# Patient Record
Sex: Female | Born: 1940 | Hispanic: No | Smoking: Former smoker
Health system: Southern US, Community
[De-identification: ages and names within clinical notes are randomized; demographics above are authoritative.]

## PROBLEM LIST (undated history)

## (undated) DIAGNOSIS — I10 Essential (primary) hypertension: Secondary | ICD-10-CM

## (undated) DIAGNOSIS — H43813 Vitreous degeneration, bilateral: Secondary | ICD-10-CM

## (undated) DIAGNOSIS — H18603 Keratoconus, unspecified, bilateral: Secondary | ICD-10-CM

## (undated) DIAGNOSIS — E785 Hyperlipidemia, unspecified: Secondary | ICD-10-CM

## (undated) DIAGNOSIS — H25813 Combined forms of age-related cataract, bilateral: Secondary | ICD-10-CM

## (undated) HISTORY — DX: Essential (primary) hypertension: I10

## (undated) HISTORY — PX: OVARIAN CYST REMOVAL: SHX89

## (undated) HISTORY — DX: Vitreous degeneration, bilateral: H43.813

## (undated) HISTORY — PX: CATARACT EXTRACTION: SUR2

## (undated) HISTORY — DX: Combined forms of age-related cataract, bilateral: H25.813

## (undated) HISTORY — DX: Keratoconus, unspecified, bilateral: H18.603

## (undated) HISTORY — DX: Hyperlipidemia, unspecified: E78.5

## (undated) HISTORY — PX: APPENDECTOMY: SHX54

---

## 2010-01-19 ENCOUNTER — Ambulatory Visit: Payer: Self-pay | Admitting: Family Medicine

## 2010-01-19 DIAGNOSIS — J45909 Unspecified asthma, uncomplicated: Secondary | ICD-10-CM | POA: Insufficient documentation

## 2010-01-19 DIAGNOSIS — E785 Hyperlipidemia, unspecified: Secondary | ICD-10-CM | POA: Insufficient documentation

## 2010-01-24 LAB — CONVERTED CEMR LAB
AST: 18 units/L (ref 0–37)
Alkaline Phosphatase: 49 units/L (ref 39–117)
BUN: 15 mg/dL (ref 6–23)
Calcium: 9.4 mg/dL (ref 8.4–10.5)
Creatinine, Ser: 0.76 mg/dL (ref 0.40–1.20)
HDL: 42 mg/dL (ref >39)
LDL Cholesterol: 132 mg/dL — ABNORMAL HIGH (ref 0–99)
Total CHOL/HDL Ratio: 5.9

## 2010-03-27 ENCOUNTER — Ambulatory Visit: Payer: Self-pay | Admitting: Family Medicine

## 2010-03-27 DIAGNOSIS — R7301 Impaired fasting glucose: Secondary | ICD-10-CM | POA: Insufficient documentation

## 2010-03-27 DIAGNOSIS — F4321 Adjustment disorder with depressed mood: Secondary | ICD-10-CM | POA: Insufficient documentation

## 2010-03-27 DIAGNOSIS — I10 Essential (primary) hypertension: Secondary | ICD-10-CM | POA: Insufficient documentation

## 2010-03-27 LAB — CONVERTED CEMR LAB: Blood Glucose, AC Bkfst: 128 mg/dL

## 2010-05-22 ENCOUNTER — Ambulatory Visit: Payer: Self-pay | Admitting: Family Medicine

## 2010-05-22 DIAGNOSIS — R0902 Hypoxemia: Secondary | ICD-10-CM | POA: Insufficient documentation

## 2010-05-23 LAB — CONVERTED CEMR LAB
Glucose, Bld: 120 mg/dL — ABNORMAL HIGH (ref 70–99)
HDL: 44 mg/dL (ref >39)
LDL Cholesterol: 112 mg/dL — ABNORMAL HIGH (ref 0–99)
Pro B Natriuretic peptide (BNP): 9.1 pg/mL (ref 0.0–100.0)

## 2010-05-24 ENCOUNTER — Telehealth (INDEPENDENT_AMBULATORY_CARE_PROVIDER_SITE_OTHER): Payer: Self-pay | Admitting: *Deleted

## 2010-09-24 ENCOUNTER — Ambulatory Visit: Payer: Self-pay | Admitting: Family Medicine

## 2010-11-26 ENCOUNTER — Telehealth: Payer: Self-pay | Admitting: Family Medicine

## 2010-12-11 NOTE — Assessment & Plan Note (Signed)
Summary: NOV- asthma   Vital Signs:  Patient profile:   70 year old female Height:      63 inches Weight:      175 pounds BMI:     31.11 O2 Sat:      97 % on Room air Temp:     98.0 degrees F oral Pulse rate:   72 / minute BP sitting:   129 / 73  (left arm) Cuff size:   large  Vitals Entered By: Payton Spark CMA (January 19, 2010 9:27 AM)  O2 Flow:  Room air CC: New to est. Rx refills Is Patient Diabetic? No Pain Assessment Patient in pain? no        Primary Care Provider:  Seymour Bars DO  CC:  New to est. Rx refills.  History of Present Illness: 31 yr WF presents to establish care. She also  needs her ProAir medication refilled. Today she has a dry cough and sinus drainage in her face. She denies HA, SOB, or congestion.   Asthma History    Initial Asthma Severity Rating:    Age range: 12+ years    Symptoms: daily    Nighttime Awakenings: 0-2/month    Interferes w/ normal activity: minor limitations    SABA use (not for EIB): daily    Exacerbations requiring oral systemic steroids: 0-1/year    Asthma Severity Assessment: Moderate Persistent   Habits & Providers  Alcohol-Tobacco-Diet     Tobacco Status: quit     Year Quit: 1982  Exercise-Depression-Behavior     Does Patient Exercise: yes     Type of exercise: walking     Exercise (avg: min/session): <30     Times/week: 3     Drug Use: no     Seat Belt Use: always  Problems Prior to Update: None  Medications Prior to Update: 1)  None  Current Medications (verified): 1)  Hydrochlorothiazide 25 Mg Tabs (Hydrochlorothiazide) .... Take 1 Tab By Mouth Once Daily 2)  Advair Diskus 250-50 Mcg/dose Aepb (Fluticasone-Salmeterol) .... Take 2 Puffs Once Daily 3)  Proair Hfa 108 (90 Base) Mcg/act Aers (Albuterol Sulfate) .... Take 2 Puffs Daily As Needed  Allergies (verified): 1)  ! * Peanuts 2)  ! * Cornbread 3)  ! * Popcorn  Past History:  Past Medical  History: Asthma Hypertension Hyperlipidemia  Past Surgical History: Ovarian cyst removal >20 years ago. Appendectomy >20 years ago.  Family History: Mother, diabetes, deceased. Father, heart attack, deceased > age 53 Sister, brain tumor, deceased. Sister, COPD, living Brother, prostate cancer, living.  Social History: Retired Lawyer. Married Former Smoker-quit 1982 Alcohol use-no Drug use-no 3 kids all local Smoking Status:  quit Does Patient Exercise:  yes Seat Belt Use:  always Drug Use:  no  Review of Systems       no fevers/sweats/weakness, unexplained wt loss/gain, no change in vision, no difficulty hearing, ringing in ears, no hay fever/allergies, no CP/discomfort, no palpitations, no breast lump/nipple discharge, no cough/wheeze, no blood in stool, no N/V/D, no nocturia, no leaking urine, no unusual vag bleeding, no vaginal/penile discharge, no muscle/joint pain, no rash, no new/changing mole, no HA, no memory loss, no anxiety, no sleep problem, no depression, no unexplained lumps, no easy bruising/bleeding, no concern with sexual function   Physical Exam  General:  alert, well-developed, well-nourished, and well-hydrated.  overwt Head:  normocephalic and atraumatic.   Eyes:  pupils equal, pupils round, and pupils reactive to light.   Ears:  EACs patent; TMs  translucent and gray with good cone of light and bony landmarks.  Nose:  scant nasal congestion Mouth:  good dentition.   Neck:  supple and full ROM.   Lungs:  Normal respiratory effort, chest expands symmetrically. Lungs are clear to auscultation, no crackles or wheezes. Heart:  Normal rate and regular rhythm. S1 and S2 normal without gallop, murmur, click, rub or other extra sounds. Msk:  normal ROM.   Pulses:  R radial normal and L radial normal.   Extremities:  no LE edema Skin:  color normal.   Psych:  good eye contact, not anxious appearing, and not depressed appearing.     Impression &  Recommendations:  Problem # 1:  ASTHMA, UNSPECIFIED, UNSPECIFIED STATUS (ICD-493.90) Discussed appropriate managment for her asthma.  Last PFTs about 2 yrs ago at [facility].  Repeat this summer and plan to change to inhaled steroid along.  Has an allergy flare so will add singulair samples to see if they help clear up her congestion.  REviewed proper dosing of her ADvair which she has not been taking correctly.  RFd ProAir, using once daily at present. eturn in 2mos for complete physical. Take Singular 10mg  daily for allergy symtpoms. Will call for refill.  Her updated medication list for this problem includes:    Advair Diskus 250-50 Mcg/dose Aepb (Fluticasone-salmeterol) .Marland Kitchen... 1 puff bid    Proair Hfa 108 (90 Base) Mcg/act Aers (Albuterol sulfate) .Marland Kitchen... Take 2 puffs daily as needed  Problem # 2:  DYSLIPIDEMIA (ICD-272.4) Off Statin x 2 mos.  Check FLP with CMP today and f/u results.  Complete Medication List: 1)  Hydrochlorothiazide 25 Mg Tabs (Hydrochlorothiazide) .... Take 1 tab by mouth once daily 2)  Advair Diskus 250-50 Mcg/dose Aepb (Fluticasone-salmeterol) .Marland Kitchen.. 1 puff bid 3)  Proair Hfa 108 (90 Base) Mcg/act Aers (Albuterol sulfate) .... Take 2 puffs daily as needed  Other Orders: T-Lipid Profile ([id number]) T-Comprehensive Metabolic Panel ([id number])  Patient Instructions: 1)  Labs today.  Will call you w/ results next wk. 2)  ProAir refilled. 3)  Use Advair 1 puff 2 x a day as directed. 4)  Try Singulair 10 mg tab each evening for allergies/ asthma. 5)  Return for a physical in 2 mos. Prescriptions: PROAIR HFA 108 (90 BASE) MCG/ACT AERS (ALBUTEROL SULFATE) Take 2 puffs daily as needed  #2 x 1   Entered and Authorized by:   Seymour Bars DO   Signed by:   Seymour Bars DO on 01/19/2010   Method used:   Electronically to        [facility] (retail)       [address]       [place], [place]  [address]       Ph: [phone]       Fax: [phone]   RxID:    [id number]

## 2010-12-11 NOTE — Assessment & Plan Note (Signed)
Summary: f/u IFG/ BP   Vital Signs:  Patient profile:   70 year old female Height:      63 inches Weight:      168 pounds BMI:     29.87 O2 Sat:      96 % on Room air Pulse rate:   80 / minute BP sitting:   120 / 75  (left arm) Cuff size:   regular  Vitals Entered By: Payton Spark CMA (Mar 27, 2010 9:00 AM)  O2 Flow:  Room air CC: 2 mo f/u. Fasting cbg and cholesterol labs. Forgets to take pravastatin most days.   Primary Care Provider:  Seymour Bars DO  CC:  2 mo f/u. Fasting cbg and cholesterol labs. Forgets to take pravastatin most days.Marland Kitchen  History of Present Illness: 70 yo WF presents for f/u OV.  She was due for f/u sugars, previously in the pre-diabetic range.  Since her last OV with me, her husband diet of metastatic cancer.  She is dealing with his loss.  She is sad but has family support.  She enjoys time with her granddaughter and is keeping busy at home.  She lost 7 lbs but had not really been trying.  She is not checking home sugars.  She ate peach cobbler last night.  She has not been taking her Pravastatin, has just been forgetting it.  Her asthma has been well controlled on current meds.      Current Medications (verified): 1)  Hydrochlorothiazide 25 Mg Tabs (Hydrochlorothiazide) .... Take 1 Tab By Mouth Once Daily 2)  Advair Diskus 250-50 Mcg/dose Aepb (Fluticasone-Salmeterol) .Marland Kitchen.. 1 Puff Bid 3)  Proair Hfa 108 (90 Base) Mcg/act Aers (Albuterol Sulfate) .... Take 2 Puffs Daily As Needed 4)  Pravastatin Sodium 40 Mg Tabs (Pravastatin Sodium) .Marland Kitchen.. 1 Tab By Mouth At Bedtime  Allergies (verified): 1)  ! * Peanuts 2)  ! * Cornbread 3)  ! * Popcorn  Past History:  Past Medical History: Asthma Hypertension Hyperlipidemia Pre- DM  Past Surgical History: Reviewed history from 01/19/2010 and no changes required. Ovarian cyst removal >20 years ago. Appendectomy >20 years ago.  Family History: Reviewed history from 01/19/2010 and no changes  required. Mother, diabetes, deceased. Father, heart attack, deceased > age 64 Sister, brain tumor, deceased. Sister, COPD, living Brother, prostate cancer, living.  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 Retired Lawyer. Married Former Smoker-quit 1982 Alcohol use-no Drug use-no 3 kids all local  Review of Systems      See HPI  Physical Exam  General:  alert, well-developed, well-nourished, and well-hydrated.   Head:  normocephalic and atraumatic.   Neck:  no masses.   Lungs:  Normal respiratory effort, chest expands symmetrically. Lungs are clear to auscultation, no crackles or wheezes. Heart:  Normal rate and regular rhythm. S1 and S2 normal without gallop, murmur, click, rub or other extra sounds. Extremities:  no LE edema Skin:  color normal.   Cervical Nodes:  No lymphadenopathy noted Psych:  good eye contact, not anxious appearing, and not depressed appearing.     Impression & Recommendations:  Problem # 1:  IMPAIRED FASTING GLUCOSE (ICD-790.21)  Fasting sugars 102, 120 today with A1C of 6.5, bordering on the dx of T2DM. Will start her on a diabetic diet and start monitoring home sugars, AM fasting 3 x a wk aiming for 70-110 range. Will hold off on Metformin unless her AM fastings >126. Referred to the ADA website for diabetic diet info.  Orders: Fingerstick (36416) Hemoglobin A1C (83036) Glucose, (CBG) ([address]109)  Problem # 2:  DYSLIPIDEMIA (ICD-272.4) Reminded pt to take her Pravastatin at bedtime.  REcheck in 8wks. Her updated medication list for this problem includes:    Pravastatin Sodium 40 Mg Tabs (Pravastatin sodium) .Marland Kitchen... 1 tab by mouth at bedtime  Problem # 3:  GRIEF REACTION, ACUTE (ICD-309.0) Doing fairly well 1 month after the loss of her husband to long term illness. Has a supportive family.  Declines need for formal counseling.  Problem # 4:  ESSENTIAL HYPERTENSION, BENIGN (ICD-401.1) BP at goal.  Continue  current meds.  Labs are UTD. Her updated medication list for this problem includes:    Hydrochlorothiazide 25 Mg Tabs (Hydrochlorothiazide) .Marland Kitchen... Take 1 tab by mouth once daily  BP today: 120/75 Prior BP: 129/73 (01/19/2010)  Labs Reviewed: K+: 4.3 (01/19/2010) Creat: : 0.76 (01/19/2010)   Chol: 247 (01/19/2010)   HDL: 42 (01/19/2010)   LDL: 132 (01/19/2010)   TG: 365 (01/19/2010)  Complete Medication List: 1)  Hydrochlorothiazide 25 Mg Tabs (Hydrochlorothiazide) .... Take 1 tab by mouth once daily 2)  Advair Diskus 250-50 Mcg/dose Aepb (Fluticasone-salmeterol) .Marland Kitchen.. 1 puff bid 3)  Proair Hfa 108 (90 Base) Mcg/act Aers (Albuterol sulfate) .... Take 2 puffs daily as needed 4)  Pravastatin Sodium 40 Mg Tabs (Pravastatin sodium) .Marland Kitchen.. 1 tab by mouth at bedtime  Patient Instructions: 1)  Start diabetic diet. 2)  Work on regular physical activity. 3)  Start checking morning fasting sugar 3 days/ wk. 4)  Goal is 80-110. 5)  Check out the ADA website for diabetic diet information. 6)  Start taking your Pravastatin at bedtime for high cholesterol. 7)  Return for f/u with sugar log book and for a repeat LDL in 8  wks.    Laboratory Results   Blood Tests     HGBA1C: 6.5%   (Normal Range: Non-Diabetic - 3-6%   Control Diabetic - 6-8%) CBG Fasting:: 128mg /dL

## 2010-12-11 NOTE — Assessment & Plan Note (Signed)
Summary: f/u BP   Vital Signs:  Patient profile:   70 year old female Height:      63 inches Weight:      169 pounds BMI:     30.05 O2 Sat:      96 % on Room air Pulse rate:   73 / minute BP sitting:   127 / 71  (left arm) Cuff size:   regular  Vitals Entered By: Payton Spark CMA (September 24, 2010 9:07 AM)  O2 Flow:  Room air CC: F/U, Hypertension Management   CC:  F/U and Hypertension Management.  Hypertension History:      She denies headache, chest pain, palpitations, dyspnea with exertion, orthopnea, peripheral edema, visual symptoms, and side effects from treatment.  She notes no problems with any antihypertensive medication side effects.        Positive major cardiovascular risk factors include female age 37 years old or older, hyperlipidemia, and hypertension.  Negative major cardiovascular risk factors include no history of diabetes, negative family history for ischemic heart disease, and non-tobacco-user status.        Further assessment for target organ damage reveals no history of ASHD, cardiac end-organ damage (CHF/LVH), stroke/TIA, peripheral vascular disease, renal insufficiency, or hypertensive retinopathy.     Current Medications (verified): 1)  Hydrochlorothiazide 25 Mg Tabs (Hydrochlorothiazide) .... Take 1 Tab By Mouth Once Daily 2)  Advair Diskus 250-50 Mcg/dose Aepb (Fluticasone-Salmeterol) .Marland Kitchen.. 1 Puff Bid 3)  Proair Hfa 108 (90 Base) Mcg/act Aers (Albuterol Sulfate) .... Take 2 Puffs Daily As Needed 4)  Pravastatin Sodium 40 Mg Tabs (Pravastatin Sodium) .Marland Kitchen.. 1 Tab By Mouth At Bedtime 5)  Onetouch Ultra Test  Strp (Glucose Blood) .... Use As Directed To Check Blood Sugar Two Times A Day  Allergies (verified): 1)  ! * Peanuts 2)  ! * Cornbread 3)  ! * Popcorn  Past History:  Past Medical History: Reviewed history from 03/27/2010 and no changes required. Asthma Hypertension Hyperlipidemia Pre- DM  Family History: Reviewed history from 01/19/2010  and no changes required. Mother, diabetes, deceased. Father, heart attack, deceased > age 48 Sister, brain tumor, deceased. Sister, COPD, living Brother, prostate cancer, living.  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   Head:  normocephalic and atraumatic.   Eyes:  pupils equal, pupils round, and pupils reactive to light.  wears glasses Mouth:  pharynx pink and moist, fair dentition, and teeth missing.   Neck:  no masses.   Lungs:  Normal respiratory effort, chest expands symmetrically. Lungs are clear to auscultation, no crackles or wheezes. Heart:  Normal rate and regular rhythm. S1 and S2 normal without gallop, murmur, click, rub or other extra sounds. Extremities:  no LE edema Skin:  color normal.   Cervical Nodes:  No lymphadenopathy noted Psych:  good eye contact, not anxious appearing, and not depressed appearing.     Impression & Recommendations:  Problem # 1:  ESSENTIAL HYPERTENSION, BENIGN (ICD-401.1) BP at goal.  Cont HCTZ daily.  Labs UTD.  Work on [employer], exercise, wt loss.   Her updated medication list for this problem includes:    Hydrochlorothiazide 25 Mg Tabs (Hydrochlorothiazide) .Marland Kitchen... Take 1 tab by mouth once daily  BP today:  127/71 Prior BP: 113/66 (05/22/2010)  10 Yr Risk Heart Disease: 13 %  Labs Reviewed: K+: 4.3 (01/19/2010) Creat: : 0.76 (01/19/2010)   Chol: 209 (05/22/2010)   HDL: 44 (05/22/2010)   LDL: 112 (05/22/2010)   TG: 267 (05/22/2010)  Problem # 2:  IMPAIRED FASTING GLUCOSE (ICD-790.21) A1C today since non fasting.  6.3 = IFG.  Dietary changes/ wt loss discussed.  Problem # 3:  DYSLIPIDEMIA (ICD-272.4) LDL goal <130, at goal on  Pravastatin and labs UTD.  Continue Pravastatin.  Add Omega 3 Fish Oil 4 capsules daily for TGs and HDL.   Her updated medication list for this problem includes:    Pravastatin Sodium 40 Mg Tabs (Pravastatin sodium) .Marland Kitchen... 1 tab by mouth at bedtime  Labs Reviewed: SGOT: 15 (05/22/2010)   SGPT: 9 (05/22/2010)  10 Yr Risk Heart Disease: 13 %   HDL:44 (05/22/2010), 42 (01/19/2010)  LDL:112 (05/22/2010), 132 (01/19/2010)  Chol:209 (05/22/2010), 247 (01/19/2010)  Trig:267 (05/22/2010), 365 (01/19/2010)  Complete Medication List: 1)  Hydrochlorothiazide 25 Mg Tabs (Hydrochlorothiazide) .... Take 1 tab by mouth once daily 2)  Advair Diskus 250-50 Mcg/dose Aepb (Fluticasone-salmeterol) .Marland Kitchen.. 1 puff bid 3)  Proair Hfa 108 (90 Base) Mcg/act Aers (Albuterol sulfate) .... Take 2 puffs daily as needed 4)  Pravastatin Sodium 40 Mg Tabs (Pravastatin sodium) .Marland Kitchen.. 1 tab by mouth at bedtime 5)  Onetouch Ultra Test Strp (Glucose blood) .... Use as directed to check blood sugar two times a day  Hypertension Assessment/Plan:      The patient's hypertensive risk group is category B: At least one risk factor (excluding diabetes) with no target organ damage.  Her calculated 10 year risk of coronary heart disease is 13 %.  Today's blood pressure is 127/71.  Her blood pressure goal is < 140/90.  Patient Instructions: 1)  A1C 6.3 = pre-diabetes. 2)  Work on LOW SUGAR/ LOW carb diet, exercise, wt loss. 3)  Stay on current meds.  4)  Add Omega 3 Fish Oil 4 capsules daily for TGs and HDL. 5)  REturn for follow up in 4 mos.   Orders Added: 1)  Est. Patient Level III [[address]109]

## 2010-12-11 NOTE — Assessment & Plan Note (Signed)
Summary: f/u IFG/ cholesterol   Vital Signs:  Patient profile:   70 year old female Height:      63 inches Weight:      163 pounds BMI:     28.98 O2 Sat:      94 % on Room air Pulse rate:   77 / minute BP sitting:   113 / 66  (left arm) Cuff size:   regular  Vitals Entered By: Payton Spark CMA (May 22, 2010 9:13 AM)  O2 Flow:  Room air CC: F/U LDL   Primary Care Provider:  Seymour Bars DO  CC:  F/U LDL.  History of Present Illness: 70 yo WF presents for f/u IFG.  She was started on a diabetic diet in May.  She is avoiding sweets and is working outside more.  She lost 5 lbs.  Her AM fastings are running 110-120.    She has been taking her Pravastatin every night for high cholesterol and is due to recheck it today.  Denies any problems with CP, SOB or med SEs.  She has asthma and has had some wheezing.    Current Medications (verified): 1)  Hydrochlorothiazide 25 Mg Tabs (Hydrochlorothiazide) .... Take 1 Tab By Mouth Once Daily 2)  Advair Diskus 250-50 Mcg/dose Aepb (Fluticasone-Salmeterol) .Marland Kitchen.. 1 Puff Bid 3)  Proair Hfa 108 (90 Base) Mcg/act Aers (Albuterol Sulfate) .... Take 2 Puffs Daily As Needed 4)  Pravastatin Sodium 40 Mg Tabs (Pravastatin Sodium) .Marland Kitchen.. 1 Tab By Mouth At Bedtime  Allergies (verified): 1)  ! * Peanuts 2)  ! * Cornbread 3)  ! * Popcorn  Past History:  Past Medical History: Reviewed history from 03/27/2010 and no changes required. Asthma Hypertension Hyperlipidemia Pre- DM  Past Surgical History: Reviewed history from 01/19/2010 and no changes required. Ovarian cyst removal >20 years ago. Appendectomy >20 years ago.  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   Head:   normocephalic and atraumatic.   Mouth:  pharynx pink and moist.   Lungs:  expiratory wheezing, faint, nonlabored. no rhonchi or rales Heart:  normal rate, regular rhythm, and no murmur.   Extremities:  no LE edema Skin:  color normal.   Cervical Nodes:  No lymphadenopathy noted Psych:  good eye contact, not anxious appearing, and not depressed appearing.     Impression & Recommendations:  Problem # 1:  IMPAIRED FASTING GLUCOSE (ICD-790.21) Fasting sugars remain in the pre diabetic range on diabetic diet alone.  She has lost 5 lbs.  Continue to check AM fastings 3 x a wk with a goal of 80-110.  If getting >120, will start her on metformin.  F/U in 3-4 mos. Orders: T-Glucose, Blood ([id number])  Problem # 2:  DYSLIPIDEMIA (ICD-272.4) Will recheck her labs today to see if the Pravastatin is working well.   Her updated medication list for this problem includes:    Pravastatin Sodium 40 Mg Tabs (Pravastatin sodium) .Marland Kitchen... 1 tab by mouth at bedtime  Orders: T-Lipid Profile ([id number]) T-ALT/SGPT ([id number]) T-AST/SGOT ([id number])  Problem # 3:  ASTHMA, UNSPECIFIED, UNSPECIFIED STATUS (ICD-493.90) Wheezing on exam and has a drop in her pulse ox to 94%.  Reminded pt to work on compliance with breathing meds.   Her updated medication list for this problem  includes:    Advair Diskus 250-50 Mcg/dose Aepb (Fluticasone-salmeterol) .Marland Kitchen... 1 puff bid    Proair Hfa 108 (90 Base) Mcg/act Aers (Albuterol sulfate) .Marland Kitchen... Take 2 puffs daily as needed  Complete Medication List: 1)  Hydrochlorothiazide 25 Mg Tabs (Hydrochlorothiazide) .... Take 1 tab by mouth once daily 2)  Advair Diskus 250-50 Mcg/dose Aepb (Fluticasone-salmeterol) .Marland Kitchen.. 1 puff bid 3)  Proair Hfa 108 (90 Base) Mcg/act Aers (Albuterol sulfate) .... Take 2 puffs daily as needed 4)  Pravastatin Sodium 40 Mg Tabs (Pravastatin sodium) .Marland Kitchen.. 1 tab by mouth at bedtime  Other Orders: T-BNP  (B Natriuretic Peptide)  ([id number])  Patient Instructions: 1)  Update lab today. 2)  Will call you w/ results tomorrow. 3)  REturn for f/u BP/ sugar in 3-4 mos.

## 2010-12-11 NOTE — Progress Notes (Signed)
       New/Updated Medications: ONETOUCH ULTRA TEST  STRP (GLUCOSE BLOOD) Use as directed to check blood sugar two times a day Prescriptions: ONETOUCH ULTRA TEST  STRP (GLUCOSE BLOOD) Use as directed to check blood sugar two times a day  #90day supply x 4   Entered by:   Payton Spark CMA   Authorized by:   Seymour Bars DO   Signed by:   Payton Spark CMA on 05/24/2010   Method used:   Electronically to        [facility]  [address] (retail)       [address]       [place], [place]  [address]       Ph: [phone]       Fax: [phone]   RxID:   [id number]

## 2010-12-13 NOTE — Progress Notes (Signed)
Summary: HCTZ refill  Phone Note Call from Patient Call back at [facility] Phone [phone]   Summary of Call: Pt is almost out of HCTZ and would like to know if [facility].  Advised pt I could not tell from looking at her chart, but I would call [facility] to verify they have received our fax. Pt very appreciative and asks if they have not recieved can we correct this and call in a few days worth to local pharmacy. Per Susie at [facility] they did receive rx and shipped meds on 11/22/10.  Pt should receive in 7-10 business days.  Message left for pt to [facility] with pharmacy info if she needs 1 week supply sent to local pharmacy. Initial call taken by: Francee Piccolo CMA Duncan Dull),  November 26, 2010 2:56 PM

## 2011-01-21 ENCOUNTER — Ambulatory Visit (INDEPENDENT_AMBULATORY_CARE_PROVIDER_SITE_OTHER): Payer: Medicare PPO | Admitting: Family Medicine

## 2011-01-21 ENCOUNTER — Encounter: Payer: Self-pay | Admitting: Family Medicine

## 2011-01-21 DIAGNOSIS — J45909 Unspecified asthma, uncomplicated: Secondary | ICD-10-CM

## 2011-01-21 DIAGNOSIS — F4321 Adjustment disorder with depressed mood: Secondary | ICD-10-CM

## 2011-01-21 DIAGNOSIS — R7301 Impaired fasting glucose: Secondary | ICD-10-CM

## 2011-01-21 DIAGNOSIS — I10 Essential (primary) hypertension: Secondary | ICD-10-CM

## 2011-01-22 ENCOUNTER — Encounter: Payer: Self-pay | Admitting: Family Medicine

## 2011-01-22 LAB — CONVERTED CEMR LAB
ALT: 15 units/L (ref 0–35)
BUN: 14 mg/dL (ref 6–23)
CO2: 26 meq/L (ref 19–32)
Creatinine, Ser: 0.79 mg/dL (ref 0.40–1.20)
Direct LDL: 146 mg/dL — ABNORMAL HIGH
Glucose, Bld: 112 mg/dL — ABNORMAL HIGH (ref 70–99)
Hgb A1c MFr Bld: 6.4 % — ABNORMAL HIGH (ref <5.7)
Total Bilirubin: 0.3 mg/dL (ref 0.3–1.2)

## 2011-01-29 NOTE — Assessment & Plan Note (Signed)
Summary: f/u BP/ cholesterol   Vital Signs:  Patient profile:   70 year old female Height:      63 inches Weight:      164 pounds BMI:     29.16 O2 Sat:      96 % on Room air Pulse rate:   67 / minute BP sitting:   137 / 78  (left arm) Cuff size:   regular  Vitals Entered By: Payton Spark CMA (January 21, 2011 9:52 AM)  O2 Flow:  Room air CC: F/U   Primary Care Provider:  Seymour Bars DO  CC:  F/U.  History of Present Illness: 70 yo WF presents for f/u visit today.  Doing well.  Asthma well controlled.  Not using her Advair lately.  Needing rescue inhaler about 5 days/ wk, 1-2 puffs/ day.  Denies nocturnal cough, allergy symptoms, wheezing or DOE.  She is due for RF of her meds. Mood is stable after the loss of her husband a few mos ago.  She has a good support system and is happy now that she is working again.  Denies needing to take anything.    Labs are due today though she has not been taking her Pravastatin.  Denies CP or DOE.    Current Medications (verified): 1)  Hydrochlorothiazide 25 Mg Tabs (Hydrochlorothiazide) .... Take 1 Tab By Mouth Once Daily 2)  Advair Diskus 250-50 Mcg/dose Aepb (Fluticasone-Salmeterol) .Marland Kitchen.. 1 Puff Bid 3)  Proair Hfa 108 (90 Base) Mcg/act Aers (Albuterol Sulfate) .... Take 2 Puffs Daily As Needed 4)  Pravastatin Sodium 40 Mg Tabs (Pravastatin Sodium) .Marland Kitchen.. 1 Tab By Mouth At Bedtime 5)  Onetouch Ultra Test  Strp (Glucose Blood) .... Use As Directed To Check Blood Sugar Two Times A Day  Allergies (verified): 1)  ! * Peanuts 2)  ! * Cornbread 3)  ! * Popcorn  Past History:  Past Medical History: Reviewed history from 03/27/2010 and no changes required. Asthma Hypertension Hyperlipidemia Pre- DM  Social History: Reviewed history from 09/24/2010 and no changes required. 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   Head:  normocephalic and atraumatic.   Eyes:  pupils equal, pupils round, and pupils reactive to light.   Mouth:  pharynx pink and moist.   Neck:  no masses.   Lungs:  Normal respiratory effort, chest expands symmetrically. Lungs are clear to auscultation, no crackles or wheezes. Heart:  Normal rate and regular rhythm. S1 and S2 normal without gallop, murmur, click, rub or other extra sounds. Pulses:  2+ radial pulses Extremities:  no E/C/C Skin:  color normal.   Cervical Nodes:  No lymphadenopathy noted Psych:  good eye contact, not anxious appearing, and not depressed appearing.     Impression & Recommendations:  Problem # 1:  ESSENTIAL HYPERTENSION, BENIGN (ICD-401.1) Update labs today.  BP OK, RFd meds. Her updated medication list for this problem includes:    Hydrochlorothiazide 25 Mg Tabs (Hydrochlorothiazide) .Marland Kitchen... Take 1 tab by mouth once daily  Orders: T-Comprehensive Metabolic Panel ([id number])  BP today: 137/78 Prior BP: 127/71 (09/24/2010)  Prior 10 Yr Risk Heart Disease: 13 % (09/24/2010)  Labs Reviewed: K+: 4.3 (01/19/2010) Creat: : 0.76 (01/19/2010)   Chol: 209 (05/22/2010)   HDL: 44 (05/22/2010)   LDL: 112 (05/22/2010)  TG: 267 (05/22/2010)  Problem # 2:  IMPAIRED FASTING GLUCOSE (ICD-790.21) Repeat A1C today.   Orders: T-Hemoglobin A1C (11914)  Problem # 3:  DYSLIPIDEMIA (ICD-272.4) Recheck LDL today and if >100, she NEEDS to take this medicine.  We discussed the importance of medication compliance to reduce risk of AMI and CVA today. Her updated medication list for this problem includes:    Pravastatin Sodium 40 Mg Tabs (Pravastatin sodium) .Marland Kitchen... 1 tab by mouth at bedtime  Orders: T-LDL Direct ([id number])  Problem # 4:  GRIEF REACTION, ACUTE (ICD-309.0) Improving  with time, working and family support.  Problem # 5:  ASTHMA, UNSPECIFIED, UNSPECIFIED  STATUS (ICD-493.90) Stable.  Meds RFd.  Will restart Advair two times a day if asthma flares. Plan to do spirometry this year. Her updated medication list for this problem includes:    Advair Diskus 250-50 Mcg/dose Aepb (Fluticasone-salmeterol) .Marland Kitchen... 1 puff bid    Proair Hfa 108 (90 Base) Mcg/act Aers (Albuterol sulfate) .Marland Kitchen... Take 2 puffs daily as needed  Complete Medication List: 1)  Hydrochlorothiazide 25 Mg Tabs (Hydrochlorothiazide) .... Take 1 tab by mouth once daily 2)  Advair Diskus 250-50 Mcg/dose Aepb (Fluticasone-salmeterol) .Marland Kitchen.. 1 puff bid 3)  Proair Hfa 108 (90 Base) Mcg/act Aers (Albuterol sulfate) .... Take 2 puffs daily as needed 4)  Pravastatin Sodium 40 Mg Tabs (Pravastatin sodium) .Marland Kitchen.. 1 tab by mouth at bedtime 5)  Onetouch Ultra Test Strp (Glucose blood) .... Use as directed to check blood sugar two times a day  Patient Instructions: 1)  Update labs today. 2)  Rx's sent to [facility]. 3)  Keep up the good work. 4)  Return for f/u cholesterol/ BP in 3 mos. Prescriptions: PRAVASTATIN SODIUM 40 MG TABS (PRAVASTATIN SODIUM) 1 tab by mouth at bedtime  #90 x 1   Entered and Authorized by:   Seymour Bars DO   Signed by:   Seymour Bars DO on 01/21/2011   Method used:   Print then Give to Patient   RxID:   [id number] PROAIR HFA 108 (90 BASE) MCG/ACT AERS (ALBUTEROL SULFATE) Take 2 puffs daily as needed  #3 x 1   Entered and Authorized by:   Seymour Bars DO   Signed by:   Seymour Bars DO on 01/21/2011   Method used:   Print then Give to Patient   RxID:   [id number] HYDROCHLOROTHIAZIDE 25 MG TABS (HYDROCHLOROTHIAZIDE) Take 1 tab by mouth once daily  #90 x 2   Entered and Authorized by:   Seymour Bars DO   Signed by:   Seymour Bars DO on 01/21/2011   Method used:   Print then Give to Patient   RxID:   [id number]    Orders Added: 1)  T-Comprehensive Metabolic Panel [[id number]] 2)  T-LDL Direct [[id number]] 3)  T-Hemoglobin A1C [23375] 4)  Est. Patient  Level IV [66063]

## 2011-04-28 ENCOUNTER — Encounter: Payer: Self-pay | Admitting: Family Medicine

## 2011-04-29 ENCOUNTER — Telehealth: Payer: Self-pay | Admitting: Family Medicine

## 2011-04-29 ENCOUNTER — Ambulatory Visit (INDEPENDENT_AMBULATORY_CARE_PROVIDER_SITE_OTHER): Payer: Medicare PPO | Admitting: Family Medicine

## 2011-04-29 ENCOUNTER — Encounter: Payer: Self-pay | Admitting: Family Medicine

## 2011-04-29 VITALS — BP 138/72 | HR 78 | Ht 63.0 in | Wt 161.0 lb

## 2011-04-29 DIAGNOSIS — R7301 Impaired fasting glucose: Secondary | ICD-10-CM

## 2011-04-29 DIAGNOSIS — E785 Hyperlipidemia, unspecified: Secondary | ICD-10-CM

## 2011-04-29 DIAGNOSIS — I10 Essential (primary) hypertension: Secondary | ICD-10-CM

## 2011-04-29 LAB — LDL CHOLESTEROL, DIRECT: Direct LDL: 93 mg/dL

## 2011-04-29 LAB — HEPATIC FUNCTION PANEL
ALT: 14 U/L (ref 0–35)
AST: 17 U/L (ref 0–37)
Albumin: 4.1 g/dL (ref 3.5–5.2)
Total Bilirubin: 0.4 mg/dL (ref 0.3–1.2)

## 2011-04-29 LAB — POCT GLYCOSYLATED HEMOGLOBIN (HGB A1C): Hemoglobin A1C: 6.3

## 2011-04-29 MED ORDER — BENAZEPRIL HCL 20 MG PO TABS: 20.0000 mg | ORAL_TABLET | Freq: Every day | ORAL | Status: DC

## 2011-04-29 NOTE — Telephone Encounter (Signed)
Pt aware of the above  

## 2011-04-29 NOTE — Assessment & Plan Note (Signed)
A1 6.3 from 6.4 with home sugars in the IFG range.  She is down 3 lbs on her own and is working on diet/ exercise.  Recheck in 4 mos.

## 2011-04-29 NOTE — Telephone Encounter (Signed)
Pls let pt know that she lost 3 lbs since her last visit.  I forgot to tell her.  Weight went from 164 to 161.  Great!  Keep up the good work.

## 2011-04-29 NOTE — Assessment & Plan Note (Signed)
BP OK on HCTZ alone but I am concerned about the hyperglyemia effects so will change her to Benazepril 20 mg/ day.  Call if any problems on it.  Return for a nurse BP check in 4 wks and to recheck a BMP.

## 2011-04-29 NOTE — Assessment & Plan Note (Signed)
Recheck LDL  with LFTs today due to higher dose of Pravastatin the past 2-3 months.  Tolerating well.

## 2011-04-29 NOTE — Patient Instructions (Signed)
Change HCTZ to Benazepril 20 mg once daily.  Return for a nurse BP check 4 wks after new start and will get a BMP (non fasting) this day.  Update LDL and liver enzymes today.  Will call you w/ results tomorrow.  A1C 6.3 today. Keep working on diabetic diet, regular exercise.   AM fasting sugar goal is 80-110.  Return for f/u in 4 mos.

## 2011-04-29 NOTE — Progress Notes (Signed)
  Subjective:    Patient ID: Lindsey Fischer, female    DOB: [birth year], 70 y.o.   MRN: [id number]  HPI70 yo WF presents for f/u visit.  She is on HCTZ 25 mg/ day for high blood pressure.  She has been cutting the grass and is needing Advair 2 x a day everyday and using ProAir 1 x a day for wheezing.  She is more physically active.  She has been checking her AM fasting sugars.  She has cut back some on her sugar and carbs in her diet.  Her AM fastings are 110 to 120.  Her last A1C as 6.4 and is 6.3 today.  Denies chest pain or DOE.  Denies swelling in her legs or heart palpitations.  Due to recheck her LDL and LFTs today after going up on her Pravastatin from 40 to 80 mg qhs 2 mos ago for an LDL of 146.  BP 138/72  Pulse 78  Ht 5\' 3"  (1.6 m)  Wt 161 lb (73.029 kg)  BMI 28.52 kg/m2  SpO2 96%   Review of Systems  Constitutional: Negative for fatigue and unexpected weight change.  Eyes: Negative for visual disturbance.  Respiratory: Negative for chest tightness, shortness of breath and wheezing.   Cardiovascular: Negative for chest pain, palpitations and leg swelling.  Genitourinary: Negative for difficulty urinating.  Neurological: Negative for headaches.  Psychiatric/Behavioral: Negative for dysphoric mood. The patient is not nervous/anxious.        Objective:   Physical Exam  Constitutional: She appears well-developed and well-nourished. No distress.  HENT:  Mouth/Throat: Oropharynx is clear and moist.  Eyes: Conjunctivae are normal.       bilat cataracts are visible  Neck: Neck supple. No thyromegaly present.  Cardiovascular: Normal rate, regular rhythm and normal heart sounds.   No murmur heard.      No audible carotid bruits  Pulmonary/Chest: Effort normal and breath sounds normal.  Musculoskeletal: She exhibits no edema.  Lymphadenopathy:    She has no cervical adenopathy.  Skin: Skin is dry.  Psychiatric: She has a normal mood and affect.          Assessment & Plan:

## 2011-04-30 ENCOUNTER — Telehealth: Payer: Self-pay | Admitting: Family Medicine

## 2011-04-30 NOTE — Telephone Encounter (Signed)
Pls let pt know that her LDL and liver look perfect on the higher dose of pravastatin.  pls continue.

## 2011-04-30 NOTE — Telephone Encounter (Signed)
Pt aware of the above  

## 2011-05-06 ENCOUNTER — Encounter: Payer: Self-pay | Admitting: Family Medicine

## 2011-05-06 ENCOUNTER — Telehealth: Payer: Self-pay | Admitting: Family Medicine

## 2011-05-06 ENCOUNTER — Ambulatory Visit (INDEPENDENT_AMBULATORY_CARE_PROVIDER_SITE_OTHER): Payer: Medicare PPO | Admitting: Family Medicine

## 2011-05-06 ENCOUNTER — Ambulatory Visit
Admission: RE | Admit: 2011-05-06 | Discharge: 2011-05-06 | Disposition: A | Discharge status: Home / Self Care | Payer: Medicare PPO | Source: Ambulatory Visit | Attending: Family Medicine | Admitting: Family Medicine

## 2011-05-06 VITALS — BP 147/75 | HR 89 | Temp 99.4°F | Wt 158.0 lb

## 2011-05-06 DIAGNOSIS — R059 Cough, unspecified: Secondary | ICD-10-CM

## 2011-05-06 DIAGNOSIS — R05 Cough: Secondary | ICD-10-CM

## 2011-05-06 DIAGNOSIS — J441 Chronic obstructive pulmonary disease with (acute) exacerbation: Secondary | ICD-10-CM

## 2011-05-06 DIAGNOSIS — J189 Pneumonia, unspecified organism: Secondary | ICD-10-CM

## 2011-05-06 MED ORDER — AZITHROMYCIN 250 MG PO TABS: ORAL_TABLET | ORAL | Status: AC

## 2011-05-06 MED ORDER — IPRATROPIUM BROMIDE 0.02 % IN SOLN
0.5000 mg | Freq: Once | RESPIRATORY_TRACT | Status: AC
  Administered 2011-05-06: 0.5 mg via RESPIRATORY_TRACT

## 2011-05-06 MED ORDER — IPRATROPIUM BROMIDE 0.02 % IN SOLN: 0.5000 mg | RESPIRATORY_TRACT | Status: DC

## 2011-05-06 MED ORDER — CEFTRIAXONE SODIUM 1 G IJ SOLR
1.0000 g | Freq: Once | INTRAMUSCULAR | Status: AC
  Administered 2011-05-06: 1 g via INTRAMUSCULAR

## 2011-05-06 MED ORDER — METHYLPREDNISOLONE ACETATE 80 MG/ML IJ SUSP
80.0000 mg | Freq: Once | INTRAMUSCULAR | Status: AC
  Administered 2011-05-06: 80 mg via INTRAMUSCULAR

## 2011-05-06 MED ORDER — IPRATROPIUM-ALBUTEROL 0.5-2.5 (3) MG/3ML IN SOLN: 3.0000 mL | Freq: Four times a day (QID) | RESPIRATORY_TRACT | Status: DC | PRN

## 2011-05-06 MED ORDER — AMBULATORY NON FORMULARY MEDICATION: Status: AC

## 2011-05-06 MED ORDER — ALBUTEROL SULFATE (2.5 MG/3ML) 0.083% IN NEBU
2.5000 mg | INHALATION_SOLUTION | Freq: Once | RESPIRATORY_TRACT | Status: AC
  Administered 2011-05-06: 2.5 mg via RESPIRATORY_TRACT

## 2011-05-06 MED ORDER — FLUTICASONE-SALMETEROL 250-50 MCG/DOSE IN AEPB: 1.0000 | INHALATION_SPRAY | Freq: Two times a day (BID) | RESPIRATORY_TRACT | Status: DC

## 2011-05-06 NOTE — Telephone Encounter (Signed)
Pls let pt know that she had airway thickening on her CXR .  Continue current treatment plan.

## 2011-05-06 NOTE — Telephone Encounter (Signed)
Pt aware of the above  

## 2011-05-06 NOTE — Patient Instructions (Signed)
CXR downstairs today. Will call you w/ results tomorrow.  Rocephin and Depo Medrol given today. Pick up Zithromax and start today.  Take for 5 days to cover for pneumonia.  Home nebulizer will be delivered to your house. You will start using DuoNebs 4 x a day for the next wk.  Call me if you have any problems.  OK to use Ibuprofen for fevers and Mucinex for cough and congestion. Rest, clear fluids.  Return to recheck next Monday.

## 2011-05-06 NOTE — Assessment & Plan Note (Signed)
Will treat for bacterial pneumonia with COPD flare up with Depo Medrol 80 mg IM x 1, Rocephin 1 gram x 1, Zithromax x 5 days, DuoNebs 4 x a day for the next wk.  Wrote her out of work this wk.  Rest, clear fluids and Mucinex as needed for chest congestion.  RTC in 1 wk for f/u.

## 2011-05-06 NOTE — Progress Notes (Signed)
  Subjective:    Patient ID: Lindsey Fischer, female    DOB: [birth year], 70 y.o.   MRN: [id number]  HPI  70 yo WF presents for feeling sick since Thursday (4 days ago).  She worked all weekend.  Her Temp has gone up to 101.7.  She has a lot of cough and congestion.  She is congested in her chest and she has a productive gray sputum.  She is not a smoker but she used to.  She starts coughing when she moves around.  She has a rescue inhaler for hx of asthma.  She is due [facility].    She had a sore throat at the onset but no rhinorrhea.  No HAs.  Has a of fatigue.  No appetite.  No N/V/D.    BP 147/75  Pulse 89  Temp(Src) 99.4 F (37.4 C) (Oral)  Wt 158 lb (71.668 kg)  SpO2 93%   Review of Systems  Constitutional: Positive for fever, chills, appetite change and fatigue.  HENT: Negative for neck pain.   Respiratory: Positive for cough, chest tightness and shortness of breath. Negative for wheezing.   Cardiovascular: Negative for leg swelling.  Gastrointestinal: Negative for nausea, abdominal pain and diarrhea.  Musculoskeletal: Negative for back pain.  Neurological: Positive for headaches. Negative for dizziness and light-headedness.       Objective:   Physical Exam  Constitutional: She appears well-developed and well-nourished.  HENT:  Mouth/Throat: Oropharynx is clear and moist.  Eyes: Conjunctivae are normal.  Neck: Neck supple.  Cardiovascular: Normal rate, regular rhythm and normal heart sounds.   Pulmonary/Chest: Effort normal. No respiratory distress. She has wheezes (diffuse). She has rales. She exhibits tenderness.  Lymphadenopathy:    She has no cervical adenopathy.  Skin: Skin is warm and dry.  Psychiatric: She has a normal mood and affect.          Assessment & Plan:

## 2011-05-13 ENCOUNTER — Ambulatory Visit (INDEPENDENT_AMBULATORY_CARE_PROVIDER_SITE_OTHER): Payer: Medicare PPO | Admitting: Family Medicine

## 2011-05-13 ENCOUNTER — Encounter: Payer: Self-pay | Admitting: Family Medicine

## 2011-05-13 VITALS — BP 153/79 | HR 70 | Temp 98.2°F | Wt 160.0 lb

## 2011-05-13 DIAGNOSIS — J189 Pneumonia, unspecified organism: Secondary | ICD-10-CM

## 2011-05-13 DIAGNOSIS — J441 Chronic obstructive pulmonary disease with (acute) exacerbation: Secondary | ICD-10-CM

## 2011-05-13 NOTE — Patient Instructions (Addendum)
You are released to go back to work.  Continue all regular meds.   Use DuoNebs as needed.  Return for f/u sugar/ BP in 3 mos.

## 2011-05-13 NOTE — Progress Notes (Signed)
  Subjective:    Patient ID: Lindsey Fischer, female    DOB: 15-Nov-1940, 70 y.o.   MRN: [id number]  HPI  70 yo WF presents for f/u Pneumonia with COPD exacerbation.  She is doing much better.  Her energy level and appetite are improving.  She did DepoMedrol injection, rocephin injection on 6-25.  She has been out of work.  She is using DuoNebs 3-4 x a day.  She is no longer febrile.  She is producing a little colored phlegm. Able to sleep ok.  Her SOB and chest tightness has improved.  She wants to go back to work.  BP 153/79  Pulse 70  Temp(Src) 98.2 F (36.8 C) (Oral)  Wt 160 lb (72.576 kg)  SpO2 96%   Review of Systems as per HPI     Objective:   Physical Exam  Constitutional: She appears well-developed and well-nourished.  HENT:  Mouth/Throat: Oropharynx is clear and moist.  Cardiovascular: Normal rate, regular rhythm and normal heart sounds.   Pulmonary/Chest: Effort normal and breath sounds normal. No respiratory distress. She has no wheezes.  Musculoskeletal: She exhibits no edema.  Skin: Skin is warm and dry.  Psychiatric: She has a normal mood and affect.          Assessment & Plan:  Pneumonia with COPD exacerbation:  Much improved from last visit on 6-25 after use of Depo Medrol, Rocephin, Zithromax and DuoNebs.  No longer febrile or having SOB.  Energy level and appetite and pulse ox are much improved.  Cleared to return to work.  Continue all regular meds and f/u in 3 mos.  Can keep nebulizer for as needed.

## 2011-07-19 ENCOUNTER — Other Ambulatory Visit: Payer: Self-pay | Admitting: *Deleted

## 2011-07-19 MED ORDER — PRAVASTATIN SODIUM 40 MG PO TABS: 40.0000 mg | ORAL_TABLET | Freq: Every day | ORAL | Status: DC

## 2011-07-24 ENCOUNTER — Telehealth: Payer: Self-pay | Admitting: Family Medicine

## 2011-07-24 ENCOUNTER — Other Ambulatory Visit: Payer: Self-pay | Admitting: *Deleted

## 2011-07-24 MED ORDER — PRAVASTATIN SODIUM 40 MG PO TABS: 40.0000 mg | ORAL_TABLET | Freq: Every day | ORAL | Status: DC

## 2011-07-24 MED ORDER — PRAVASTATIN SODIUM 40 MG PO TABS: 80.0000 mg | ORAL_TABLET | Freq: Every day | ORAL | Status: DC

## 2011-07-24 NOTE — Telephone Encounter (Signed)
New rx sent

## 2011-07-24 NOTE — Telephone Encounter (Signed)
Acknowledged. Katelan Hirt, LPN /Triage  

## 2011-07-24 NOTE — Telephone Encounter (Signed)
Pharmacist called regarding a script that was sent electronically for pravachol .  Directions say to take 1 pill am and 2 pills at bedtime.  Can you please confirm this is correct because pharmacy is questioning these directions.   Plan:  Routed to Dr. Linford Arnold

## 2011-08-05 ENCOUNTER — Telehealth: Payer: Self-pay | Admitting: Family Medicine

## 2011-08-05 NOTE — Telephone Encounter (Signed)
Pt calling to check on her appt. Plan:  Appt in October 2012 confirmed. Jarvis Newcomer, LPN [facility]

## 2011-08-13 ENCOUNTER — Ambulatory Visit: Payer: Medicare PPO | Admitting: Family Medicine

## 2011-09-03 ENCOUNTER — Ambulatory Visit (INDEPENDENT_AMBULATORY_CARE_PROVIDER_SITE_OTHER): Payer: Medicare PPO | Admitting: Family Medicine

## 2011-09-03 ENCOUNTER — Encounter: Payer: Self-pay | Admitting: Family Medicine

## 2011-09-03 VITALS — BP 152/81 | HR 75 | Ht 64.0 in | Wt 160.0 lb

## 2011-09-03 DIAGNOSIS — I10 Essential (primary) hypertension: Secondary | ICD-10-CM

## 2011-09-03 DIAGNOSIS — E785 Hyperlipidemia, unspecified: Secondary | ICD-10-CM

## 2011-09-03 DIAGNOSIS — R7301 Impaired fasting glucose: Secondary | ICD-10-CM

## 2011-09-03 LAB — BASIC METABOLIC PANEL WITH GFR
BUN: 19 mg/dL (ref 6–23)
CO2: 25 meq/L (ref 19–32)
Calcium: 9.8 mg/dL (ref 8.4–10.5)
Chloride: 106 meq/L (ref 96–112)
Creat: 0.81 mg/dL (ref 0.50–1.10)
Glucose, Bld: 107 mg/dL — ABNORMAL HIGH (ref 70–99)
Potassium: 4.6 meq/L (ref 3.5–5.3)
Sodium: 142 meq/L (ref 135–145)

## 2011-09-03 LAB — LDL CHOLESTEROL, DIRECT: Direct LDL: 100 mg/dL — ABNORMAL HIGH

## 2011-09-03 LAB — POCT GLYCOSYLATED HEMOGLOBIN (HGB A1C): Hemoglobin A1C: 6.2

## 2011-09-03 MED ORDER — LISINOPRIL-HYDROCHLOROTHIAZIDE 20-12.5 MG PO TABS: 1.0000 | ORAL_TABLET | Freq: Every day | ORAL | Status: DC

## 2011-09-03 NOTE — Patient Instructions (Addendum)
Hypertension As your heart beats, it forces blood through your arteries. This force is your blood pressure. If the pressure is too high, it is called hypertension (HTN) or high blood pressure. HTN is dangerous because you may have it and not know it. High blood pressure may mean that your heart has to work harder to pump blood. Your arteries may be narrow or stiff. The extra work puts you at risk for heart disease, stroke, and other problems.  Blood pressure consists of two numbers, a higher number over a lower, 110/72, for example. It is stated as "110 over 72." The ideal is below 120 for the top number (systolic) and under 80 for the bottom (diastolic). Write down your blood pressure today. You should pay close attention to your blood pressure if you have certain conditions such as:  Heart failure.   Prior heart attack.   Diabetes   Chronic kidney disease.   Prior stroke.   Multiple risk factors for heart disease.  To see if you have HTN, your blood pressure should be measured while you are seated with your arm held at the level of the heart. It should be measured at least twice. A one-time elevated blood pressure reading (especially in the Emergency Department) does not mean that you need treatment. There may be conditions in which the blood pressure is different between your right and left arms. It is important to see your caregiver soon for a recheck. Most people have essential hypertension which means that there is not a specific cause. This type of high blood pressure may be lowered by changing lifestyle factors such as:  Stress.   Smoking.   Lack of exercise.   Excessive weight.   Drug/tobacco/alcohol use.   Eating less salt.  Most people do not have symptoms from high blood pressure until it has caused damage to the body. Effective treatment can often prevent, delay or reduce that damage. TREATMENT  When a cause has been identified, treatment for high blood pressure is  directed at the cause. There are a large number of medications to treat HTN. These fall into several categories, and your caregiver will help you select the medicines that are best for you. Medications may have side effects. You should review side effects with your caregiver. If your blood pressure stays high after you have made lifestyle changes or started on medicines,   Your medication(s) may need to be changed.   Other problems may need to be addressed.   Be certain you understand your prescriptions, and know how and when to take your medicine.   Be sure to follow up with your caregiver within the time frame advised (usually within two weeks) to have your blood pressure rechecked and to review your medications.   If you are taking more than one medicine to lower your blood pressure, make sure you know how and at what times they should be taken. Taking two medicines at the same time can result in blood pressure that is too low.  SEEK IMMEDIATE MEDICAL CARE IF:  You develop a severe headache, blurred or changing vision, or confusion.   You have unusual weakness or numbness, or a faint feeling.   You have severe chest or abdominal pain, vomiting, or breathing problems.  MAKE SURE YOU:   Understand these instructions.   Will watch your condition.   Will get help right away if you are not doing well or get worse.  Document Released: 10/28/2005 Document Revised: 07/10/2011 Document Reviewed:   06/17/2008 [employer] Patient Information 2012 [place], [place].Hypertriglyceridemia     Diet for High blood levels of Triglycerides Most fats in food are triglycerides. Triglycerides in your blood are stored as fat in your body. High levels of triglycerides in your blood may put you at a greater risk for heart disease and stroke.  Normal triglyceride levels are less than 150 mg/dL. Borderline high levels are 150-199 mg/dl. High levels are 200 - 499 mg/dL, and very high triglyceride levels are greater  than 500 mg/dL. The decision to treat high triglycerides is generally based on the level. For people with borderline or high triglyceride levels, treatment includes weight loss and exercise. Drugs are recommended for people with very high triglyceride levels. Many people who need treatment for high triglyceride levels have metabolic syndrome. This syndrome is a collection of disorders that often include: insulin resistance, high blood pressure, blood clotting problems, high cholesterol and triglycerides. TESTING PROCEDURE FOR TRIGLYCERIDES  You should not eat 4 hours before getting your triglycerides measured. The normal range of triglycerides is between 10 and 250 milligrams per deciliter (mg/dl). Some people may have extreme levels (1000 or above), but your triglyceride level may be too high if it is above 150 mg/dl, depending on what other risk factors you have for heart disease.   People with high blood triglycerides may also have high blood cholesterol levels. If you have high blood cholesterol as well as high blood triglycerides, your risk for heart disease is probably greater than if you only had high triglycerides. High blood cholesterol is one of the main risk factors for heart disease.  CHANGING YOUR DIET  Your weight can affect your blood triglyceride level. If you are more than 20% above your ideal body weight, you may be able to lower your blood triglycerides by losing weight. Eating less and exercising regularly is the best way to combat this. Fat provides more calories than any other food. The best way to lose weight is to eat less fat. Only 30% of your total calories should come from fat. Less than 7% of your diet should come from saturated fat. A diet low in fat and saturated fat is the same as a diet to decrease blood cholesterol. By eating a diet lower in fat, you may lose weight, lower your blood cholesterol, and lower your blood triglyceride level.  Eating a diet low in fat, especially  saturated fat, may also help you lower your blood triglyceride level. Ask your dietitian to help you figure how much fat you can eat based on the number of calories your caregiver has prescribed for you.  Exercise, in addition to helping with weight loss may also help lower triglyceride levels.   Alcohol can increase blood triglycerides. You may need to stop drinking alcoholic beverages.   Too much carbohydrate in your diet may also increase your blood triglycerides. Some complex carbohydrates are necessary in your diet. These may include bread, rice, potatoes, other starchy vegetables and cereals.   Reduce "simple" carbohydrates. These may include pure sugars, candy, honey, and jelly without losing other nutrients. If you have the kind of high blood triglycerides that is affected by the amount of carbohydrates in your diet, you will need to eat less sugar and less high-sugar foods. Your caregiver can help you with this.   Adding 2-4 grams of fish oil (EPA+ DHA) may also help lower triglycerides. Speak with your caregiver before adding any supplements to your regimen.  Following the Diet  Maintain your ideal  weight. Your caregivers can help you with a diet. Generally, eating less food and getting more exercise will help you lose weight. Joining a weight control group may also help. Ask your caregivers for a good weight control group in your area.  Eat low-fat foods instead of high-fat foods. This can help you lose weight too.  These foods are lower in fat. Eat MORE of these:   Dried beans, peas, and lentils.   Egg whites.   Low-fat cottage cheese.   Fish.   Lean cuts of meat, such as round, sirloin, rump, and flank (cut extra fat off meat you fix).   Whole grain breads, cereals and pasta.   Skim and nonfat dry milk.   Low-fat yogurt.   Poultry without the skin.   Cheese made with skim or part-skim milk, such as mozzarella, parmesan, farmers', ricotta, or pot cheese.  These are  higher fat foods. Eat LESS of these:   Whole milk and foods made from whole milk, such as American, blue, cheddar, monterey jack, and swiss cheese   High-fat meats, such as luncheon meats, sausages, knockwurst, bratwurst, hot dogs, ribs, corned beef, ground pork, and regular ground beef.   Fried foods.  Limit saturated fats in your diet. Substituting unsaturated fat for saturated fat may decrease your blood triglyceride level. You will need to read package labels to know which products contain saturated fats.  These foods are high in saturated fat. Eat LESS of these:   Fried pork skins.   Whole milk.   Skin and fat from poultry.   Palm oil.   Butter.   Shortening.   Cream cheese.   Tomasa Blase.   Margarines and baked goods made from listed oils.   Vegetable shortenings.   Chitterlings.   Fat from meats.   Coconut oil.   Palm kernel oil.   Lard.   Cream.   Sour cream.   Fatback.   Coffee whiteners and non-dairy creamers made with these oils.   Cheese made from whole milk.  Use unsaturated fats (both polyunsaturated and monounsaturated) moderately. Remember, even though unsaturated fats are better than saturated fats; you still want a diet low in total fat.  These foods are high in unsaturated fat:   Canola oil.   Sunflower oil.   Mayonnaise.   Almonds.   Peanuts.   Pine nuts.   Margarines made with these oils.   Safflower oil.   Olive oil.   Avocados.   Cashews.   Peanut butter.   Sunflower seeds.   Soybean oil.   Peanut oil.   Olives.   Pecans.   Walnuts.   Pumpkin seeds.  Avoid sugar and other high-sugar foods. This will decrease carbohydrates without decreasing other nutrients. Sugar in your food goes rapidly to your blood. When there is excess sugar in your blood, your liver may use it to make more triglycerides. Sugar also contains calories without other important nutrients.  Eat LESS of these:   Sugar, brown sugar, powdered  sugar, jam, jelly, preserves, honey, syrup, molasses, pies, candy, cakes, cookies, frosting, pastries, colas, soft drinks, punches, fruit drinks, and regular gelatin.   Avoid alcohol. Alcohol, even more than sugar, may increase blood triglycerides. In addition, alcohol is high in calories and low in nutrients. Ask for sparkling water, or a diet soft drink instead of an alcoholic beverage.  Suggestions for planning and preparing meals   Bake, broil, grill or roast meats instead of frying.   Remove fat from meats and  skin from poultry before cooking.   Add spices, herbs, lemon juice or vinegar to vegetables instead of salt, rich sauces or gravies.   Use a non-stick skillet without fat or use no-stick sprays.   Cool and refrigerate stews and broth. Then remove the hardened fat floating on the surface before serving.   Refrigerate meat drippings and skim off fat to make low-fat gravies.   Serve more fish.   Use less butter, margarine and other high-fat spreads on bread or vegetables.   Use skim or reconstituted non-fat dry milk for cooking.   Cook with low-fat cheeses.   Substitute low-fat yogurt or cottage cheese for all or part of the sour cream in recipes for sauces, dips or congealed salads.   Use half yogurt/half mayonnaise in salad recipes.   Substitute evaporated skim milk for cream. Evaporated skim milk or reconstituted non-fat dry milk can be whipped and substituted for whipped cream in certain recipes.   Choose fresh fruits for dessert instead of high-fat foods such as pies or cakes. Fruits are naturally low in fat.  When Dining Out   Order low-fat appetizers such as fruit or vegetable juice, pasta with vegetables or tomato sauce.   Select clear, rather than cream soups.   Ask that dressings and gravies be served on the side. Then use less of them.   Order foods that are baked, broiled, poached, steamed, stir-fried, or roasted.   Ask for margarine instead of butter,  and use only a small amount.   Drink sparkling water, unsweetened tea or coffee, or diet soft drinks instead of alcohol or other sweet beverages.  QUESTIONS AND ANSWERS ABOUT OTHER FATS IN THE BLOOD: SATURATED FAT, TRANS FAT, AND CHOLESTEROL What is trans fat? Trans fat is a type of fat that is formed when vegetable oil is hardened through a process called hydrogenation. This process helps makes foods more solid, gives them shape, and prolongs their shelf life. Trans fats are also called hydrogenated or partially hydrogenated oils.  What do saturated fat, trans fat, and cholesterol in foods have to do with heart disease? Saturated fat, trans fat, and cholesterol in the diet all raise the level of LDL "bad" cholesterol in the blood. The higher the LDL cholesterol, the greater the risk for coronary heart disease (CHD). Saturated fat and trans fat raise LDL similarly.  What foods contain saturated fat, trans fat, and cholesterol? High amounts of saturated fat are found in animal products, such as fatty cuts of meat, chicken skin, and full-fat dairy products like butter, whole milk, cream, and cheese, and in tropical vegetable oils such as palm, palm kernel, and coconut oil. Trans fat is found in some of the same foods as saturated fat, such as vegetable shortening, some margarines (especially hard or stick margarine), crackers, cookies, baked goods, fried foods, salad dressings, and other processed foods made with partially hydrogenated vegetable oils. Small amounts of trans fat also occur naturally in some animal products, such as milk products, beef, and lamb. Foods high in cholesterol include liver, other organ meats, egg yolks, shrimp, and full-fat dairy products. How can I use the new food label to make heart-healthy food choices? Check the Nutrition Facts panel of the food label. Choose foods lower in saturated fat, trans fat, and cholesterol. For saturated fat and cholesterol, you can also use the  Percent Daily Value (%DV): 5% DV or less is low, and 20% DV or more is high. (There is no %DV for trans fat.) Use  the Nutrition Facts panel to choose foods low in saturated fat and cholesterol, and if the trans fat is not listed, read the ingredients and limit products that list shortening or hydrogenated or partially hydrogenated vegetable oil, which tend to be high in trans fat. POINTS TO REMEMBER: YOU NEED A LITTLE TLC (THERAPEUTIC LIFESTYLE CHANGES)  Discuss your risk for heart disease with your caregivers, and take steps to reduce risk factors.   Change your diet. Choose foods that are low in saturated fat, trans fat, and cholesterol.   Add exercise to your daily routine if it is not already being done. Participate in physical activity of moderate intensity, like brisk walking, for at least 30 minutes on most, and preferably all days of the week. No time? Break the 30 minutes into three, 10-minute segments during the day.   Stop smoking. If you do smoke, contact your caregiver to discuss ways in which they can help you quit.   Do not use street drugs.   Maintain a normal weight.   Maintain a healthy blood pressure.   Keep up with your blood work for checking the fats in your blood as directed by your caregiver.  Document Released: 08/15/2004 Document Revised: 07/10/2011 Document Reviewed: 03/13/2009 [facility] Patient Information 2012 [place], [place].

## 2011-09-03 NOTE — Assessment & Plan Note (Signed)
Will get LDL and A1c to make sure cholesterol is at goal and BS is under control.

## 2011-09-03 NOTE — Progress Notes (Signed)
  Subjective:    Patient ID: Lindsey Fischer, female    DOB: Dec 19, 1940, 70 y.o.   MRN: [id number]  Hyperlipidemia This is a chronic problem. The current episode started more than 1 year ago. The problem is controlled. Recent lipid tests were reviewed and are variable. Pertinent negatives include no chest pain.  Hypertension This is a chronic problem. The current episode started more than 1 year ago. The problem has been gradually worsening since onset. The problem is uncontrolled. Pertinent negatives include no anxiety or chest pain. Past treatments include ACE inhibitors.   should be noted the patient is on fosinopril for she's had fluctuations of her blood pressure is low she is concerned about that she states that lately it seems like there is systolic is running in the 150 she had been on HCTZ one time but his had been stopped. New  Elevated blood sugar her hemoglobin A1c has been slightly elevated blood sugar fasting is also been elevated when he came in with see and will get the lipid LDL that she's been constant getting to follow her empiric was tolerance and her hyperlipidemia.   Review of Systems  Cardiovascular: Negative for chest pain.  All other systems reviewed and are negative.      DP was 281 height 5 foot 4 inches weight 180 pounds pulse 75 SpO2 96 Objective:   Physical Exam  Vitals reviewed. Constitutional: She is oriented to person, place, and time. She appears well-developed and well-nourished.  HENT:  Head: Normocephalic and atraumatic.  Neck: Neck supple.  Cardiovascular: Normal rate and regular rhythm.   Pulmonary/Chest: Effort normal and breath sounds normal.  Neurological: She is alert and oriented to person, place, and time.  Skin: Skin is warm.  Psychiatric: She has a normal mood and affect.          Assessment & Plan:  #1Will add diuretics as to the lisinopril 20/12.5  #2 because of the elevated blood sugars check A1C  #3 hyperlipidemia we will check  a LDL to make sure that her cholesterol under control   #4  flu shot if needed.

## 2011-11-26 ENCOUNTER — Telehealth: Payer: Self-pay | Admitting: Family Medicine

## 2011-11-26 MED ORDER — LISINOPRIL-HYDROCHLOROTHIAZIDE 20-12.5 MG PO TABS: 1.0000 | ORAL_TABLET | Freq: Every day | ORAL | Status: DC

## 2011-11-26 MED ORDER — PRAVASTATIN SODIUM 40 MG PO TABS: 80.0000 mg | ORAL_TABLET | Freq: Every day | ORAL | Status: DC

## 2011-11-26 NOTE — Telephone Encounter (Signed)
LMOM for Pt to CB if she needs further help

## 2011-11-26 NOTE — Telephone Encounter (Signed)
Patient called left voicemail req to get a call back from nurse to discuss cholesterol med

## 2011-12-05 ENCOUNTER — Ambulatory Visit (INDEPENDENT_AMBULATORY_CARE_PROVIDER_SITE_OTHER): Payer: Medicare PPO | Admitting: Family Medicine

## 2011-12-05 ENCOUNTER — Encounter: Payer: Self-pay | Admitting: Family Medicine

## 2011-12-05 VITALS — BP 162/78 | HR 69 | Temp 98.6°F | Ht 65.0 in | Wt 156.0 lb

## 2011-12-05 DIAGNOSIS — I10 Essential (primary) hypertension: Secondary | ICD-10-CM

## 2011-12-05 DIAGNOSIS — E785 Hyperlipidemia, unspecified: Secondary | ICD-10-CM

## 2011-12-05 DIAGNOSIS — E8881 Metabolic syndrome: Secondary | ICD-10-CM

## 2011-12-05 MED ORDER — PITAVASTATIN CALCIUM 2 MG PO TABS: 2.0000 mg | ORAL_TABLET | ORAL | Status: DC

## 2011-12-05 MED ORDER — LISINOPRIL-HYDROCHLOROTHIAZIDE 20-25 MG PO TABS: 1.0000 | ORAL_TABLET | Freq: Every day | ORAL | Status: DC

## 2011-12-05 MED ORDER — METFORMIN HCL 500 MG PO TABS: 500.0000 mg | ORAL_TABLET | Freq: Two times a day (BID) | ORAL | Status: DC

## 2011-12-05 NOTE — Patient Instructions (Signed)
Metabolic Syndrome, Adult Metabolic syndrome descibes a group of risk factors for heart disease and diabetes. This syndrome has other names including Insulin Resistance Syndrome. The more risk factors you have, the higher your risk of having a heart attack, stroke, or developing diabetes. These risk factors include:  High blood sugar.   High blood triglyceride (a fat found in the blood) level.   High blood pressure.   Abdominal obesity (your extra weight is around your waist instead of your hips).   Low levels of high-density lipoprotein, HDL (good blood cholesterol).  If you have any three of these risk factors, you have metabolic syndrome. If you have even one of these factors, you should make lifestyle changes to improve your health in order to prevent serious health diseases.  In people with metabolic syndrome, the cells do not respond properly to insulin. This can lead to high levels of glucose in the blood, which can interfere with normal body processes. Eventually, this can cause high blood pressure and higher fat levels in the blood, and inflammation of your blood vessels. The result can be heart disease and stroke.  CAUSES   Eating a diet rich in calories and saturated fat.   Too little physical activity.   Being overweight.  Other underlying causes are:  Family history (genetics).   Ethnicity (South Asians are at a higher risk).   Older age (your chances of developing metabolic syndrome are higher as you grow older).   Insulin resistance.  SYMPTOMS  By itself, metabolic syndrome has no symptoms. However, you might have symptoms of diabetes (high blood sugar) or high blood pressure, such as:  Increased thirst, urination, and tiredness.   Dizzy spells.   Dull headaches that are unusual for you.   Blurred vision.   Nosebleeds.  DIAGNOSIS  Your caregiver may make a diagnosis of metabolic syndrome if you have at least three of these factors:  If you are overweight  mostly around the waist. This means a waistline greater than 40" in men and more than 35" in women. The waistline limits are 31 to 35 inches for women and 37 to 39 inches for men. In those who have certain genetic risk factors, such as having a family history of diabetes or being of Asian descent.   If you have a blood pressure of 130/85 mm Hg or more, or if you are being treated for high blood pressure.   If your blood triglyceride level is 150 mg/dL or more, or you are being treated for high levels of triglyceride.   If the level of HDL in your blood is below 40 mg/dL in men, less than 50 mg/dL in women, or you are receiving treatment for low levels of HDL.   If the level of sugar in your blood is high with fasting blood sugar level of 110 mg/dL or more, or you are under treatment for diabetes.  TREATMENT  Your caregiver may have you make lifestyle changes, which may include:  Exercise.   Losing weight.   Maintaining a healthy diet.   Quitting smoking.  The lifestyle changes listed above are key in reducing your risk for heart disease and stroke. Medicines may also be prescribed to help your body respond to insulin better and to reduce your blood pressure and blood fat levels. Aspirin may be recommended to reduce risks of heart disease or stroke.  HOME CARE INSTRUCTIONS   Exercise.   Measure your waist at regular intervals just above the hipbones after   you have breathed out.   Maintain a healthy diet.   Eat fruits, such as apples, oranges, and pears.   Eat vegetables.   Eat legumes, such as kidney beans, peas, and lentils.   Eat food rich in soluble fiber, such as whole grain cereal, oatmeal, and oat bran.   Use olive or safflower oils and avoid saturated fats.   Eat nuts.   Limit the amount of salt you eat or add to food.   Limit the amount of alcohol you drink.   Include fish in your diet, if possible.   Stop smoking if you are a smoker.   Maintain regular  follow-up appointments.   Follow your caregiver's advice.  SEEK MEDICAL CARE IF:   You feel very tired or fatigued.   You develop excessive thirst.   You pass large quantities of urine.   You are putting on weight around your waist rather than losing weight.   You develop headaches over and over again.   You have off-and-on dizzy spells.  SEEK IMMEDIATE MEDICAL CARE IF:   You develop nosebleeds.   You develop sudden blurred vision.   You develop sudden dizzy spells.   You develop chest pains, trouble breathing, or feel an abnormal or irregular heart beat.   You have a fainting episode.   You develop any sudden trouble speaking and/or swallowing.   You develop sudden weakness in one arm and/or one leg.  MAKE SURE YOU:   Understand these instructions.   Will watch your condition.   Will get help right away if you are not doing well or get worse.  Document Released: 02/04/2008 Document Revised: 07/10/2011 Document Reviewed: 02/04/2008 [employer] Patient Information 2012 [employer]. 

## 2011-12-07 NOTE — Assessment & Plan Note (Signed)
Hyperlipidemia not under good control Will stop Pravachol place on Livlo 2 mg 1 po q day Recheck in 4 months

## 2011-12-07 NOTE — Assessment & Plan Note (Signed)
Hypertension still not at target goal  Increase Lisinopril-HCTZ from 20-12.5 to 20-25

## 2011-12-07 NOTE — Progress Notes (Signed)
  Subjective:    Patient ID: Lindsey Fischer, female    DOB: December 26, 1940, 71 y.o.   MRN: [id number]  HPI  Patient w/ a dry cough last month no symptoms after early AM for about a month #2 she is here for follow up of HBP #3 hyperlipidemia follow up #4 elevated BS despite diet  Review of Systems As above    Objective:   Physical Exam  WN WD WF Lungs clear to auscultation and percussion Nasal mucosa unremarkable Neck is supple  BP 162/78  Pulse 69  Temp(Src) 98.6 F (37 C) (Oral)  Ht 5\' 5"  (1.651 m)  Wt 156 lb (70.761 kg)  BMI 25.96 kg/m2  SpO2 94%  Results for orders placed in visit on 09/03/11  POCT GLYCOSYLATED HEMOGLOBIN (HGB A1C)      Component Value Range   Hemoglobin A1C 6.2    LDL CHOLESTEROL, DIRECT      Component Value Range   Direct LDL 100 (*)   BASIC METABOLIC PANEL      Component Value Range   Sodium 142  135 - 145 (mEq/L)   Potassium 4.6  3.5 - 5.3 (mEq/L)   Chloride 106  96 - 112 (mEq/L)   CO2 25  19 - 32 (mEq/L)   Glucose, Bld 107 (*) 70 - 99 (mg/dL)   BUN 19  6 - 23 (mg/dL)   Creat 1.47  8.29 - 5.62 (mg/dL)   Calcium 9.8  8.4 - 13.0 (mg/dL)      Assessment & Plan:   Cough continue to monitor for now she denies any malaise or bad feelings HBP elevated greater than standards increase HCTZ of the lisinopril/hctz to 25 mg Hyperlipidemia also not at standard stop the Pravachol and start Livlo samples given w/scrip Metabolic Syndrome wiith A1c of 6.2 and BS of 107 if not frank diabetes.Willl start omn metformin 500 mg once a day initial and consider increasing dosage at next visit Hyperlipidemia

## 2011-12-30 ENCOUNTER — Other Ambulatory Visit: Payer: Self-pay | Admitting: *Deleted

## 2011-12-30 MED ORDER — ALBUTEROL SULFATE HFA 108 (90 BASE) MCG/ACT IN AERS: 2.0000 | INHALATION_SPRAY | Freq: Every day | RESPIRATORY_TRACT | Status: DC

## 2012-02-04 ENCOUNTER — Other Ambulatory Visit: Payer: Self-pay | Admitting: Family Medicine

## 2012-02-11 ENCOUNTER — Other Ambulatory Visit: Payer: Self-pay | Admitting: *Deleted

## 2012-02-11 MED ORDER — FLUTICASONE-SALMETEROL 250-50 MCG/DOSE IN AEPB: 1.0000 | INHALATION_SPRAY | Freq: Two times a day (BID) | RESPIRATORY_TRACT | Status: DC

## 2012-02-28 ENCOUNTER — Encounter: Payer: Self-pay | Admitting: *Deleted

## 2012-03-05 ENCOUNTER — Encounter: Payer: Self-pay | Admitting: Family Medicine

## 2012-03-05 ENCOUNTER — Ambulatory Visit (INDEPENDENT_AMBULATORY_CARE_PROVIDER_SITE_OTHER): Payer: Medicare PPO | Admitting: Family Medicine

## 2012-03-05 VITALS — BP 135/72 | HR 72 | Ht 65.0 in | Wt 157.0 lb

## 2012-03-05 DIAGNOSIS — I10 Essential (primary) hypertension: Secondary | ICD-10-CM

## 2012-03-05 DIAGNOSIS — E785 Hyperlipidemia, unspecified: Secondary | ICD-10-CM

## 2012-03-05 DIAGNOSIS — R7309 Other abnormal glucose: Secondary | ICD-10-CM

## 2012-03-05 DIAGNOSIS — E8881 Metabolic syndrome: Secondary | ICD-10-CM | POA: Insufficient documentation

## 2012-03-05 DIAGNOSIS — R739 Hyperglycemia, unspecified: Secondary | ICD-10-CM

## 2012-03-05 DIAGNOSIS — Z23 Encounter for immunization: Secondary | ICD-10-CM

## 2012-03-05 NOTE — Patient Instructions (Signed)
Metabolic Syndrome, Adult Metabolic syndrome descibes a group of risk factors for heart disease and diabetes. This syndrome has other names including Insulin Resistance Syndrome. The more risk factors you have, the higher your risk of having a heart attack, stroke, or developing diabetes. These risk factors include:  High blood sugar.   High blood triglyceride (a fat found in the blood) level.   High blood pressure.   Abdominal obesity (your extra weight is around your waist instead of your hips).   Low levels of high-density lipoprotein, HDL (good blood cholesterol).  If you have any three of these risk factors, you have metabolic syndrome. If you have even one of these factors, you should make lifestyle changes to improve your health in order to prevent serious health diseases.  In people with metabolic syndrome, the cells do not respond properly to insulin. This can lead to high levels of glucose in the blood, which can interfere with normal body processes. Eventually, this can cause high blood pressure and higher fat levels in the blood, and inflammation of your blood vessels. The result can be heart disease and stroke.  CAUSES   Eating a diet rich in calories and saturated fat.   Too little physical activity.   Being overweight.  Other underlying causes are:  Family history (genetics).   Ethnicity (South Asians are at a higher risk).   Older age (your chances of developing metabolic syndrome are higher as you grow older).   Insulin resistance.  SYMPTOMS  By itself, metabolic syndrome has no symptoms. However, you might have symptoms of diabetes (high blood sugar) or high blood pressure, such as:  Increased thirst, urination, and tiredness.   Dizzy spells.   Dull headaches that are unusual for you.   Blurred vision.   Nosebleeds.  DIAGNOSIS  Your caregiver may make a diagnosis of metabolic syndrome if you have at least three of these factors:  If you are overweight  mostly around the waist. This means a waistline greater than 40" in men and more than 35" in women. The waistline limits are 31 to 35 inches for women and 37 to 39 inches for men. In those who have certain genetic risk factors, such as having a family history of diabetes or being of Asian descent.   If you have a blood pressure of 130/85 mm Hg or more, or if you are being treated for high blood pressure.   If your blood triglyceride level is 150 mg/dL or more, or you are being treated for high levels of triglyceride.   If the level of HDL in your blood is below 40 mg/dL in men, less than 50 mg/dL in women, or you are receiving treatment for low levels of HDL.   If the level of sugar in your blood is high with fasting blood sugar level of 110 mg/dL or more, or you are under treatment for diabetes.  TREATMENT  Your caregiver may have you make lifestyle changes, which may include:  Exercise.   Losing weight.   Maintaining a healthy diet.   Quitting smoking.  The lifestyle changes listed above are key in reducing your risk for heart disease and stroke. Medicines may also be prescribed to help your body respond to insulin better and to reduce your blood pressure and blood fat levels. Aspirin may be recommended to reduce risks of heart disease or stroke.  HOME CARE INSTRUCTIONS   Exercise.   Measure your waist at regular intervals just above the hipbones after   you have breathed out.   Maintain a healthy diet.   Eat fruits, such as apples, oranges, and pears.   Eat vegetables.   Eat legumes, such as kidney beans, peas, and lentils.   Eat food rich in soluble fiber, such as whole grain cereal, oatmeal, and oat bran.   Use olive or safflower oils and avoid saturated fats.   Eat nuts.   Limit the amount of salt you eat or add to food.   Limit the amount of alcohol you drink.   Include fish in your diet, if possible.   Stop smoking if you are a smoker.   Maintain regular  follow-up appointments.   Follow your caregiver's advice.  SEEK MEDICAL CARE IF:   You feel very tired or fatigued.   You develop excessive thirst.   You pass large quantities of urine.   You are putting on weight around your waist rather than losing weight.   You develop headaches over and over again.   You have off-and-on dizzy spells.  SEEK IMMEDIATE MEDICAL CARE IF:   You develop nosebleeds.   You develop sudden blurred vision.   You develop sudden dizzy spells.   You develop chest pains, trouble breathing, or feel an abnormal or irregular heart beat.   You have a fainting episode.   You develop any sudden trouble speaking and/or swallowing.   You develop sudden weakness in one arm and/or one leg.  MAKE SURE YOU:   Understand these instructions.   Will watch your condition.   Will get help right away if you are not doing well or get worse.  Document Released: 02/04/2008 Document Revised: 10/17/2011 Document Reviewed: 02/04/2008 [employer] Patient Information 2012 [employer]. 

## 2012-03-05 NOTE — Progress Notes (Signed)
Subjective:    Patient ID: Lindsey Fischer, female    DOB: October 09, 1941, 71 y.o.   MRN: [id number]  HPI #1 hypertension. The patient did take the increased blood pressure medicine of lisinopril HCTZ 20-25. She reports no side effects with the addition of HCTZ. #2 hyperlipidemia. She is still taking her Pravachol 40. She was reluctant to start on the Livalo. She wants to have her cholesterol rechecked which we can do today before she makes that switch. #3 metabolic syndrome. Explained to her about metabolic syndrome. She has not taken the metformin. Prevacid think is her not having to take medication to control her blood sugars. Try to give her Explanations on how the pancreas he is to wear out it is important that we get something to help it function. I've also told her no matter how much weight loss, improvement of her diet, and initiation of exercise her pancreas is going to continue to age and as it ages is going to need help sooner than later. #4 immunization needs. In going over her immunizations she has had pneumonia vaccination a few years ago and because she was over 65 she would not need another dosage but she is going to need a tetanus.  Review of Systems  Constitutional: Negative for activity change.  HENT: Positive for congestion.        Coiugh      BP 135/72  Pulse 72  Ht 5\' 5"  (1.651 m)  Wt 157 lb (71.215 kg)  BMI 26.13 kg/m2  SpO2 95% Objective:   Physical Exam  Constitutional: She is oriented to person, place, and time. She appears well-developed.  HENT:  Head: Normocephalic.  Neck: Normal range of motion. Neck supple.  Cardiovascular: Normal rate, regular rhythm and normal heart sounds.   Pulmonary/Chest: Effort normal and breath sounds normal. No respiratory distress.  Musculoskeletal: Normal range of motion.  Neurological: She is alert and oriented to person, place, and time.  Skin: Skin is warm.  Psychiatric: She has a normal mood and affect. Her behavior is normal.        Results for orders placed in visit on 03/05/12  POCT GLYCOSYLATED HEMOGLOBIN (HGB A1C)      Component Value Range   Hemoglobin A1C 6.2     Results for orders placed in visit on 09/03/11  POCT GLYCOSYLATED HEMOGLOBIN (HGB A1C)      Component Value Range   Hemoglobin A1C 6.2    LDL CHOLESTEROL, DIRECT      Component Value Range   Direct LDL 100 (*)   BASIC METABOLIC PANEL      Component Value Range   Sodium 142  135 - 145 (mEq/L)   Potassium 4.6  3.5 - 5.3 (mEq/L)   Chloride 106  96 - 112 (mEq/L)   CO2 25  19 - 32 (mEq/L)   Glucose, Bld 107 (*) 70 - 99 (mg/dL)   BUN 19  6 - 23 (mg/dL)   Creat 4.09  8.11 - 9.14 (mg/dL)   Calcium 9.8  8.4 - 78.2 (mg/dL)   Assement and Plan  #1 hypertension. Blood pressure is doing much better since adding HCTZ to her lisinopril. Systolic has gone from 165 to 956.Will continue with this. I did tell her if the dry cough continues we may have to take her off the lisinopril and try an ARB. #2 metabolic syndrome. Explained to the patient the metformin can be helpful in keeping her pancreas well by reducing its activity. She will give this  more thought.  #3 hyperlipidemia. We'll see what her cholesterol and LDL is currently before she makes a change in her statin medication.  #4 immunization needs. Will   administer tetanus and further review shows that her colonoscopy that she had about 5 years ago need to be repeated because of a polyp and that she has not had a mammogram in several years. We'll have her return in 4 months for wellness exam for Medicare patients.

## 2012-03-06 LAB — LIPID PANEL
Cholesterol: 233 mg/dL — ABNORMAL HIGH (ref 0–200)
HDL: 62 mg/dL (ref >39)
Total CHOL/HDL Ratio: 3.8 Ratio
Triglycerides: 176 mg/dL — ABNORMAL HIGH (ref <150)

## 2012-03-06 LAB — CBC WITH DIFFERENTIAL/PLATELET
Basophils Absolute: 0.1 10*3/uL (ref 0.0–0.1)
Eosinophils Absolute: 0.1 10*3/uL (ref 0.0–0.7)
Eosinophils Relative: 2 % (ref 0–5)
Lymphs Abs: 2.7 10*3/uL (ref 0.7–4.0)
MCH: 28.7 pg (ref 26.0–34.0)
MCHC: 31.7 g/dL (ref 30.0–36.0)
MCV: 90.6 fL (ref 78.0–100.0)
Platelets: 228 10*3/uL (ref 150–400)
RDW: 14 % (ref 11.5–15.5)

## 2012-03-06 LAB — COMPREHENSIVE METABOLIC PANEL
BUN: 18 mg/dL (ref 6–23)
CO2: 29 mEq/L (ref 19–32)
Calcium: 9.2 mg/dL (ref 8.4–10.5)
Chloride: 105 mEq/L (ref 96–112)
Creat: 0.75 mg/dL (ref 0.50–1.10)

## 2012-04-27 ENCOUNTER — Telehealth: Payer: Self-pay | Admitting: *Deleted

## 2012-04-27 NOTE — Telephone Encounter (Signed)
Pharm called stating sig on metformin was unclear. Pt was hesitant to take so she never started Rx. I advised pharm to fill metformin 500 1 tab po qd and then Pt can discuss increasing or staying at current at next OV. Pharm agreed.

## 2012-07-07 ENCOUNTER — Ambulatory Visit (INDEPENDENT_AMBULATORY_CARE_PROVIDER_SITE_OTHER): Payer: Medicare PPO | Admitting: Family Medicine

## 2012-07-07 ENCOUNTER — Encounter: Payer: Self-pay | Admitting: Family Medicine

## 2012-07-07 VITALS — BP 140/76 | HR 70 | Ht 65.0 in | Wt 157.0 lb

## 2012-07-07 DIAGNOSIS — E785 Hyperlipidemia, unspecified: Secondary | ICD-10-CM

## 2012-07-07 DIAGNOSIS — R739 Hyperglycemia, unspecified: Secondary | ICD-10-CM

## 2012-07-07 DIAGNOSIS — Z1211 Encounter for screening for malignant neoplasm of colon: Secondary | ICD-10-CM

## 2012-07-07 DIAGNOSIS — Z0000 Encounter for general adult medical examination without abnormal findings: Secondary | ICD-10-CM

## 2012-07-07 DIAGNOSIS — Z13820 Encounter for screening for osteoporosis: Secondary | ICD-10-CM

## 2012-07-07 DIAGNOSIS — Z1239 Encounter for other screening for malignant neoplasm of breast: Secondary | ICD-10-CM

## 2012-07-07 LAB — LIPID PANEL
Cholesterol: 233 mg/dL — ABNORMAL HIGH (ref 0–200)
HDL: 52 mg/dL (ref >39)
Total CHOL/HDL Ratio: 4.5 Ratio

## 2012-07-07 LAB — CBC WITH DIFFERENTIAL/PLATELET
Basophils Relative: 1 % (ref 0–1)
Eosinophils Absolute: 0.1 10*3/uL (ref 0.0–0.7)
Eosinophils Relative: 2 % (ref 0–5)
Hemoglobin: 12.5 g/dL (ref 12.0–15.0)
MCH: 29.3 pg (ref 26.0–34.0)
MCHC: 35 g/dL (ref 30.0–36.0)
Monocytes Absolute: 0.5 10*3/uL (ref 0.1–1.0)
Monocytes Relative: 11 % (ref 3–12)
Neutrophils Relative %: 25 % — ABNORMAL LOW (ref 43–77)

## 2012-07-07 LAB — COMPLETE METABOLIC PANEL WITH GFR
Alkaline Phosphatase: 47 U/L (ref 39–117)
BUN: 13 mg/dL (ref 6–23)
Creat: 0.83 mg/dL (ref 0.50–1.10)
GFR, Est Non African American: 72 mL/min
Glucose, Bld: 106 mg/dL — ABNORMAL HIGH (ref 70–99)
Total Bilirubin: 0.5 mg/dL (ref 0.3–1.2)

## 2012-07-07 LAB — HEMOGLOBIN A1C
Hgb A1c MFr Bld: 6 % — ABNORMAL HIGH (ref <5.7)
Mean Plasma Glucose: 126 mg/dL — ABNORMAL HIGH (ref <117)

## 2012-07-07 LAB — TSH: TSH: 2.034 u[IU]/mL (ref 0.350–4.500)

## 2012-07-07 MED ORDER — PITAVASTATIN CALCIUM 4 MG PO TABS: 2.0000 mg | ORAL_TABLET | Freq: Every day | ORAL | Status: DC

## 2012-07-07 NOTE — Patient Instructions (Addendum)
Mammogram Tips Healthy women should begin getting mammograms every year or two once they reach age 71, and once a year when they reach age 25. Here are tips:  Find an experienced, high-volume center with accomplished radiologists. You can ask for their credentials.   Ask to see the certificate showing the center is approved by the [place] Food and Drug Administration.   Use the same center regularly, so it is easier to compare your new mammograms with your old ones.   Bring a list of places you have had mammograms, dates, biopsies or other breast treatments. Bring old mammograms with you or have them sent to your primary caregiver.   Describe any breast problems to your caregiver or the person doing the mammogram. Be ready to give past surgeries, birth control pills, hormone use, breast implants, growths, moles, breast scars and family or personal history of breast cancer.   Call your doctor or center to check on the mammogram if you hear nothing within 10 days. Do not assume everything was normal.   To protect your privacy, the mammogram results cannot be given over the phone or to anyone but you.   Radiation from a mammogram is very low and does not pose a radiation risk.   Mammograms can detect breast problems other than breast cancer.   You may be asked stand or sit in front of the X-ray machine.   Two small plastic or glass plates are placed around the breast when taking the X-ray.   If you are menstruating, schedule your mammogram a week after your menstrual period.   Do not wear deodorants, powder or perfume when getting a mammogram.   Wash your breasts and under your arms before getting a mammogram.   Wear cloths that are easy for you to undress and dress.   Arrive at the center at least 15 minutes before the mammogram is scheduled.   There may be slight discomfort during the mammogram, but it goes away shortly after the test.   Try to relax as much as possible during the  mammogram.   Talk to your caregiver if you do not understand the results of the mammogram.   Follow the recommendations of your caregiver regarding further tests and treatments if needed.   Get a second opinion if you are concerned or question the results of the mammogram, further tests or treatment if needed.   Continue with monthly self-breast exams and yearly caregiver exams even if the mammogram is normal.   Your caregiver may recommend getting a mammogram before age 59 and more often if you are at high risk for developing breast cancer.  Document Released: 02/13/2006 Document Revised: 10/17/2011 Document Reviewed: 10/21/2008 [facility] Patient Information 2012 [place], [place].Bone Densitometry Bone densitometry is a special X-ray that measures your bone density and can be used to help predict your risk of bone fractures. This test is used to determine bone mineral content and density to diagnose osteoporosis. Osteoporosis is the loss of bone that may cause the bone to become weak. Osteoporosis commonly occurs in women entering menopause. However, it may be found in men and in people with other diseases. PREPARATION FOR TEST No preparation necessary. WHO SHOULD BE TESTED?  All women older than 68.   Postmenopausal women (50 to 41) with risk factors for osteoporosis.   People with a previous fracture caused by normal activities.   People with a small body frame (less than 127 poundsor a body mass index [BMI] of less than  21).   People who have a parent with a hip fracture or history of osteoporosis.   People who smoke.   People who have rheumatoid arthritis.   Anyone who engages in excessive alcohol use (more than 3 drinks most days).   Women who experience early menopause.  WHEN SHOULD YOU BE RETESTED? Current guidelines suggest that you should wait at least 2 years before doing a bone density test again if your first test was normal.Recent studies indicated that women with  normal bone density may be able to wait a few years before needing to repeat a bone density test. You should discuss this with your caregiver.  NORMAL FINDINGS   Normal: less than standard deviation below normal (greater than -1).   Osteopenia: 1 to 2.5 standard deviations below normal (-1 to -2.5).   Osteoporosis: greater than 2.5 standard deviations below normal (less than -2.5).  Test results are reported as a "T score" and a "Z score."The T score is a number that compares your bone density with the bone density of healthy, young women.The Z score is a number that compares your bone density with the scores of women who are the same age, gender, and race.  Ranges for normal findings may vary among different laboratories and hospitals. You should always check with your doctor after having lab work or other tests done to discuss the meaning of your test results and whether your values are considered within normal limits. MEANING OF TEST  Your caregiver will go over the test results with you and discuss the importance and meaning of your results, as well as treatment options and the need for additional tests if necessary. OBTAINING THE TEST RESULTS It is your responsibility to obtain your test results. Ask the lab or department performing the test when and how you will get your results. Document Released: 11/19/2004 Document Revised: 10/17/2011 Document Reviewed: 12/12/2010 [facility] Patient Information 2012 [place], [place].

## 2012-07-07 NOTE — Progress Notes (Signed)
Subjective:     Lindsey Fischer is a 71 y.o. female and is here for a comprehensive physical exam. The patient reports no problems.  History   Social History  . Marital Status: Married    Spouse Name: N/A    Number of Children: N/A  . Years of Education: N/A   Occupational History  . Not on file.   Social History Main Topics  . Smoking status: Former Smoker    Quit date: 11/11/1980  . Smokeless tobacco: Never Used  . Alcohol Use: No  . Drug Use: No  . 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   Other Topics Concern  . Not on file   Social History Narrative  . No narrative on file   Health Maintenance  Topic Date Due  . Zostavax  08/14/2001  . Influenza Vaccine  08/11/2012  . Colonoscopy  06/11/2017  . Tetanus/tdap  03/05/2022  . Pneumococcal Polysaccharide Vaccine Age 81 And Over  Completed    The following portions of the patient's history were reviewed and updated as appropriate: allergies, current medications, past family history, past medical history, past social history and past surgical history.  Review of Systems A comprehensive review of systems was negative.   Objective:    BP 140/76  Pulse 70  Ht 5\' 5"  (1.651 m)  Wt 157 lb (71.215 kg)  BMI 26.13 kg/m2  SpO2 95% General appearance: alert, cooperative, appears stated age and no distress Head: Normocephalic, without obvious abnormality, atraumatic Eyes: conjunctivae/corneas clear. PERRL, EOM's intact. Fundi benign. Ears: normal TM's and external ear canals both ears Nose: Nares normal. Septum midline. Mucosa normal. No drainage or sinus tenderness. Throat: lips, mucosa, and tongue normal; teeth and gums normal Neck: no adenopathy, no carotid bruit, no JVD, supple, symmetrical, trachea midline and thyroid not enlarged, symmetric, no tenderness/mass/nodules Back: negative, no kyphosis present, symmetric, no curvature. ROM normal. No CVA tenderness. Lungs: clear to  auscultation bilaterally Breasts: normal appearance, no masses or tenderness Heart: regular rate and rhythm, S1, S2 normal, no murmur, click, rub or gallop Abdomen: soft, non-tender; bowel sounds normal; no masses,  no organomegaly Pelvic: external genitalia normal and perianal skin: no external genital warts noted rectal exam normal hemoccult negative Extremities: extremities normal, atraumatic, no cyanosis or edema Pulses: Right Pulses: FEM: present 2+, POP: present 2+, DP: present 2+, PT: present 2+ Left Pulses: FEM: present 2+, POP: present 2+, DP: present 2+, PT: present 2+ Skin: Skin color, texture, turgor normal. No rashes or lesions Neurologic: Alert and oriented X 3, normal strength and tone. Normal symmetric reflexes. Normal coordination and gait Mental status: Alert, oriented, thought content appropriate Cranial nerves: normal    Assessment:    Healthy female exam.      Plan:     See After Visit Summary for Counseling Recommendations   Patient has had a colonoscopy 5 years ago. Will schedule her for mammogram and bone density since his been years since her last mammogram and no known bone density.  Also patient was afraid of Livalo cost will help her by giving discount cards. Return in 4 months for followup.  Subjective:    Lindsey Fischer is a 71 y.o. female who presents for Medicare Annual/Subsequent preventive examination.  Preventive Screening-Counseling & Management  Tobacco History  Smoking status  . Former Smoker  . Quit date: 11/11/1980  Smokeless tobacco  . Never Used     Problems  Prior to Visit 1.   Current Problems (verified) Patient Active Problem List  Diagnosis  . DYSLIPIDEMIA  . GRIEF REACTION, ACUTE  . ESSENTIAL HYPERTENSION, BENIGN  . ASTHMA, UNSPECIFIED, UNSPECIFIED STATUS  . IMPAIRED FASTING GLUCOSE  . Metabolic syndrome X    Medications Prior to Visit Current Outpatient Prescriptions on File Prior to Visit  Medication Sig Dispense  Refill  . albuterol (PROAIR HFA) 108 (90 BASE) MCG/ACT inhaler Inhale 2 puffs into the lungs daily.  18 g  2  . AMBULATORY NON FORMULARY MEDICATION Nebulizer machine and tubing  Dx:  COPD exacerbation  1 Units  0  . Fluticasone-Salmeterol (ADVAIR DISKUS) 250-50 MCG/DOSE AEPB Inhale 1 puff into the lungs every 12 (twelve) hours.  180 each  3  . glucose blood test strip 1 each 2 (two) times daily. Use as instructed       . ipratropium-albuterol (DUONEB) 0.5-2.5 (3) MG/3ML SOLN Take 3 mLs by nebulization every 6 (six) hours as needed.  360 mL  0  . lisinopril-hydrochlorothiazide (PRINZIDE,ZESTORETIC) 20-25 MG per tablet Take 1 tablet by mouth daily.  30 tablet  6  . metFORMIN (GLUCOPHAGE) 500 MG tablet Take 500 mg by mouth daily as needed. Take 1 tablet a day until notified to increase to twice aday      . DISCONTD: Pitavastatin Calcium (LIVALO) 2 MG TABS Take 1 tablet (2 mg total) by mouth 1 day or 1 dose.  30 tablet  6    Current Medications (verified) Current Outpatient Prescriptions  Medication Sig Dispense Refill  . albuterol (PROAIR HFA) 108 (90 BASE) MCG/ACT inhaler Inhale 2 puffs into the lungs daily.  18 g  2  . AMBULATORY NON FORMULARY MEDICATION Nebulizer machine and tubing  Dx:  COPD exacerbation  1 Units  0  . Fluticasone-Salmeterol (ADVAIR DISKUS) 250-50 MCG/DOSE AEPB Inhale 1 puff into the lungs every 12 (twelve) hours.  180 each  3  . glucose blood test strip 1 each 2 (two) times daily. Use as instructed       . ipratropium-albuterol (DUONEB) 0.5-2.5 (3) MG/3ML SOLN Take 3 mLs by nebulization every 6 (six) hours as needed.  360 mL  0  . lisinopril-hydrochlorothiazide (PRINZIDE,ZESTORETIC) 20-25 MG per tablet Take 1 tablet by mouth daily.  30 tablet  6  . metFORMIN (GLUCOPHAGE) 500 MG tablet Take 500 mg by mouth daily as needed. Take 1 tablet a day until notified to increase to twice aday      . Pitavastatin Calcium (LIVALO) 4 MG TABS Take 0.5-1 tablets (2-4 mg total) by mouth  daily.  30 tablet  11  . DISCONTD: Pitavastatin Calcium (LIVALO) 2 MG TABS Take 1 tablet (2 mg total) by mouth 1 day or 1 dose.  30 tablet  6     Allergies (verified) Peanut-containing drug products   PAST HISTORY  Family History Family History  Problem Relation Age of Onset  . Diabetes Mother   . Heart attack Father 1    deceased  . Cancer Sister     brain tumor  . Cancer Brother     prostrate  . COPD Sister     Social History History  Substance Use Topics  . Smoking status: Former Smoker    Quit date: 11/11/1980  . Smokeless tobacco: Never Used  . Alcohol Use: No     Are there smokers in your home (other than you)? No  Risk Factors Current exercise habits: The patient does not participate in regular exercise at present.  Dietary issues discussed: currently following guidelines for cholesterol and glucose control   Cardiac risk factors: advanced age (older than 55 for men, 22 for women), dyslipidemia, obesity (BMI >= 30 kg/m2) and sedentary lifestyle. No Depression Screen (Note: if answer to either of the following is "Yes", a more complete depression screening is indicated)   Over the past two weeks, have you felt down, depressed or hopeless?   Over the past two weeks, have you felt little interest or pleasure in doing things? No  Have you lost interest or pleasure in daily life? No  Do you often feel hopeless? No  Do you cry easily over simple problems? No  Activities of Daily Living In your present state of health, do you have any difficulty performing the following activities?:  Driving? No Managing money?  No Feeding yourself? No Getting from bed to chair? No  Climbing a flight of stairs? No Preparing food and eating?: No Bathing or showering? No Getting dressed: No Getting to the toilet? No Using the toilet:No Moving around from place to place: No In the past year have you fallen or had a near fall?:No   Are you sexually active?  No  Do you have  more than one partner?  Widowed and not active now  Hearing Difficulties: Yes Do you often ask people to speak up or repeat themselves? Yes Do you experience ringing or noises in your ears? No Do you have difficulty understanding soft or whispered voices? Yes   Do you feel that you have a problem with memory? No  Do you often misplace items? No  Do you feel safe at home?  Yes  Cognitive Testing  Alert? Yes  Normal Appearance?Yes  Oriented to person? Yes  Place? Yes   Time? Yes  Recall of three objects?  Yes  Can perform simple calculations? Yes  Displays appropriate judgment?Yes  Can read the correct time from a watch face?Yes   Advanced Directives have been discussed with the patient? No  List the Names of Other Physician/Practitioners you currently use: 1.    Indicate any recent Medical Services you may have received from other than [facility] providers in the past year (date may be approximate).  Immunization History  Administered Date(s) Administered  . Influenza Whole 09/11/2010  . Pneumococcal Polysaccharide 09/11/2010  . Tdap 03/05/2012    Screening Tests Health Maintenance  Topic Date Due  . Zostavax  08/14/2001  . Influenza Vaccine  08/11/2012  . Colonoscopy  06/11/2017  . Tetanus/tdap  03/05/2022  . Pneumococcal Polysaccharide Vaccine Age 66 And Over  Completed    All answers were reviewed with the patient and necessary referrals were made:  Hassan Rowan, MD   07/07/2012   History reviewed: problem list  Review of Systems A comprehensive review of systems was negative.    Objective:     Vision by Snellen chart: right eye:20/35, left eye:20/50  Body mass index is 26.13 kg/(m^2). BP 140/76  Pulse 70  Ht 5\' 5"  (1.651 m)  Wt 157 lb (71.215 kg)  BMI 26.13 kg/m2  SpO2 95%  BP 140/76  Pulse 70  Ht 5\' 5"  (1.651 m)  Wt 157 lb (71.215 kg)  BMI 26.13 kg/m2  SpO2 95% General appearance: alert, cooperative and appears stated age Head: Normocephalic, without  obvious abnormality, atraumatic Eyes: conjunctivae/corneas clear. PERRL, EOM's intact. Fundi benign. Ears: normal TM's and external ear canals both ears Nose: Nares normal. Septum midline. Mucosa normal. No drainage or sinus tenderness. Throat: lips,  mucosa, and tongue normal; teeth and gums normal Neck: no adenopathy, no carotid bruit, no JVD, supple, symmetrical, trachea midline and thyroid not enlarged, symmetric, no tenderness/mass/nodules Back: symmetric, no curvature. ROM normal. No CVA tenderness. Lungs: clear to auscultation bilaterally Breasts: normal appearance, no masses or tenderness Heart: regular rate and rhythm, S1, S2 normal, no murmur, click, rub or gallop Abdomen: soft, non-tender; bowel sounds normal; no masses,  no organomegaly Pelvic: external genitalia normal, perianal skin: no external genital warts noted and rectal exam negative and hemoccult negative Extremities: extremities normal, atraumatic, no cyanosis or edema Pulses: 2+ and symmetric Skin: Skin color, texture, turgor normal. No rashes or lesions Neurologic: Alert and oriented X 3, normal strength and tone. Normal symmetric reflexes. Normal coordination and gait Mental status: Alert, oriented, thought content appropriate Cranial nerves: normal Sensory: normal Motor: grossly normal Reflexes: 2+ and symmetric     Assessment:     Health maintenance Hyperlipidemia will restart the Livalo Also obtain screening mammogram and bone densitometry     Plan:     During the course of the visit the patient was educated and counseled about appropriate screening and preventive services including:    Screening mammography  Bone densitometry screening  Colorectal cancer screening  Diet review for nutrition referral? Yes ____  Not Indicated _see above___   Patient Instructions (the written plan) was given to the patient.  Medicare Attestation I have personally reviewed: The patient's medical and social  history Their use of alcohol, tobacco or illicit drugs Their current medications and supplements The patient's functional ability including ADLs,fall risks, home safety risks, cognitive, and hearing and visual impairment Diet and physical activities Evidence for depression or mood disorders  The patient's weight, height, BMI, and visual acuity have been recorded in the chart.  I have made referrals, counseling, and provided education to the patient based on review of the above and I have provided the patient with a written personalized care plan for preventive services.     Hassan Rowan, MD   07/07/2012

## 2012-07-09 LAB — HEMOCCULT GUIAC POC 1CARD (OFFICE): Fecal Occult Blood, POC: NEGATIVE

## 2012-07-09 LAB — VITAMIN D 1,25 DIHYDROXY: Vitamin D 1, 25 (OH)2 Total: 64 pg/mL (ref 18–72)

## 2012-07-09 LAB — POCT URINALYSIS DIPSTICK
Bilirubin, UA: NEGATIVE
Blood, UA: NEGATIVE
Nitrite, UA: NEGATIVE
pH, UA: 6

## 2012-07-09 NOTE — Addendum Note (Signed)
Addended by: Ellsworth Lennox on: 07/09/2012 09:28 AM   Modules accepted: Orders

## 2012-07-14 ENCOUNTER — Encounter: Payer: Self-pay | Admitting: Family Medicine

## 2012-07-21 ENCOUNTER — Ambulatory Visit (INDEPENDENT_AMBULATORY_CARE_PROVIDER_SITE_OTHER): Payer: Medicare PPO

## 2012-07-21 ENCOUNTER — Other Ambulatory Visit: Payer: Medicare PPO

## 2012-07-21 DIAGNOSIS — Z0000 Encounter for general adult medical examination without abnormal findings: Secondary | ICD-10-CM

## 2012-07-21 DIAGNOSIS — Z780 Asymptomatic menopausal state: Secondary | ICD-10-CM

## 2012-07-21 DIAGNOSIS — Z1231 Encounter for screening mammogram for malignant neoplasm of breast: Secondary | ICD-10-CM

## 2012-07-30 ENCOUNTER — Encounter: Payer: Self-pay | Admitting: *Deleted

## 2012-10-14 ENCOUNTER — Other Ambulatory Visit: Payer: Self-pay | Admitting: Family Medicine

## 2012-11-10 ENCOUNTER — Ambulatory Visit: Payer: Medicare PPO | Admitting: Family Medicine

## 2012-11-12 ENCOUNTER — Ambulatory Visit: Payer: Medicare PPO | Admitting: Physician Assistant

## 2012-11-13 ENCOUNTER — Ambulatory Visit (INDEPENDENT_AMBULATORY_CARE_PROVIDER_SITE_OTHER): Payer: Medicare PPO | Admitting: Physician Assistant

## 2012-11-13 ENCOUNTER — Encounter: Payer: Self-pay | Admitting: Physician Assistant

## 2012-11-13 VITALS — BP 123/68 | HR 68 | Ht 65.0 in | Wt 158.0 lb

## 2012-11-13 DIAGNOSIS — Z79899 Other long term (current) drug therapy: Secondary | ICD-10-CM

## 2012-11-13 DIAGNOSIS — E119 Type 2 diabetes mellitus without complications: Secondary | ICD-10-CM

## 2012-11-13 DIAGNOSIS — J45909 Unspecified asthma, uncomplicated: Secondary | ICD-10-CM

## 2012-11-13 DIAGNOSIS — I10 Essential (primary) hypertension: Secondary | ICD-10-CM

## 2012-11-13 DIAGNOSIS — R252 Cramp and spasm: Secondary | ICD-10-CM

## 2012-11-13 DIAGNOSIS — E785 Hyperlipidemia, unspecified: Secondary | ICD-10-CM

## 2012-11-13 LAB — LIPID PANEL
Cholesterol: 169 mg/dL (ref 0–200)
HDL: 51 mg/dL (ref >39)
Total CHOL/HDL Ratio: 3.3 Ratio

## 2012-11-13 LAB — COMPREHENSIVE METABOLIC PANEL
Albumin: 4.1 g/dL (ref 3.5–5.2)
Alkaline Phosphatase: 46 U/L (ref 39–117)
BUN: 14 mg/dL (ref 6–23)
CO2: 27 mEq/L (ref 19–32)
Glucose, Bld: 111 mg/dL — ABNORMAL HIGH (ref 70–99)
Total Bilirubin: 0.5 mg/dL (ref 0.3–1.2)

## 2012-11-13 MED ORDER — METFORMIN HCL 500 MG PO TABS: 500.0000 mg | ORAL_TABLET | Freq: Every day | ORAL | Status: DC

## 2012-11-13 MED ORDER — LISINOPRIL-HYDROCHLOROTHIAZIDE 20-25 MG PO TABS: 1.0000 | ORAL_TABLET | Freq: Every day | ORAL | Status: DC

## 2012-11-13 MED ORDER — ALBUTEROL SULFATE HFA 108 (90 BASE) MCG/ACT IN AERS: 2.0000 | INHALATION_SPRAY | Freq: Every day | RESPIRATORY_TRACT | Status: DC

## 2012-11-13 MED ORDER — PITAVASTATIN CALCIUM 4 MG PO TABS: 2.0000 mg | ORAL_TABLET | Freq: Every day | ORAL | Status: DC

## 2012-11-13 NOTE — Progress Notes (Signed)
  Subjective:    Patient ID: Lindsey Fischer, female    DOB: [birth year], 71 y.o.   MRN: [id number]  HPI Patient presents to the clinic to follow up on Diabetes, HTN, and hyperlipidemia.   She denies any CP, palpitations, HA's. She occasionally checks fasting blood glucose and always under 120. She takes medication regularly.   When asked about using rescue inhaler she is using 3-4 times a week. She is only taking advair once a day. She reports that she feels slightly out of breath at night before bed and a puff of rescue inhaler always helps.   Pt is having some muscle cramps at night. She has not done anything to make better. They are in both legs and do resolve after time. She has been trying to drink more water.    Review of Systems     Objective:   Physical Exam  Constitutional: She is oriented to person, place, and time. She appears well-developed and well-nourished.  HENT:  Head: Normocephalic and atraumatic.  Cardiovascular: Normal rate, regular rhythm and normal heart sounds.   Pulmonary/Chest: Effort normal and breath sounds normal.  Neurological: She is alert and oriented to person, place, and time.  Skin: Skin is warm and dry.  Psychiatric: She has a normal mood and affect. Her behavior is normal.          Assessment & Plan:  Diabetes- A1C is 6.2. Discussed with pt that if we can get under 6.0 we could get off Metformin. Refilled today. Continue to make good diet and portion control choices. Follow up in 6 months.   HTN- BP looks great today. Continue with medication. Refilled today.  Dyslipidemia- Will recheck today. Will refill medication accordingly.  Muscle cramps- Discussed hydration. Will check potassium today. Did give handout with food higher in potassium. If cramps start try alka setzer tab and see if that helps. Follow up as needed.  ASthma- Increase advair to twice a day 2 puffs. Goal is only use rescue inhaler 1-2 times a week at most. Follow up if still  having to use regularly.

## 2012-11-13 NOTE — Patient Instructions (Addendum)
Can use Advair inhaler up to twice a day to avoid using rescue inhaler.   Stay hydrated to avoid cramps. Consider alka seltzer tab if cramps start.   Foods Rich in Potassium Food / Potassium (mg)  Apricots, dried,  cup / 378 mg   Apricots, raw, 1 cup halves / 401 mg   Avocado,  / 487 mg   Banana, 1 large / 487 mg   Beef, lean, round, 3 oz / 202 mg   Cantaloupe, 1 cup cubes / 427 mg   Dates, medjool, 5 whole / 835 mg   Ham, cured, 3 oz / 212 mg   Lentils, dried,  cup / 458 mg   Lima beans, frozen,  cup / 258 mg   Orange, 1 large / 333 mg   Orange juice, 1 cup / 443 mg   Peaches, dried,  cup / 398 mg   Peas, split, cooked,  cup / 355 mg   Potato, boiled, 1 medium / 515 mg   Prunes, dried, uncooked,  cup / 318 mg   Raisins,  cup / 309 mg   Salmon, pink, raw, 3 oz / 275 mg   Sardines, canned , 3 oz / 338 mg   Tomato, raw, 1 medium / 292 mg   Tomato juice, 6 oz / 417 mg   [place], 3 oz / 349 mg  Document Released: 10/28/2005 Document Revised: 07/10/2011 Document Reviewed: 03/13/2009 [facility] Patient Information 2012 [place], [place].

## 2012-11-15 DIAGNOSIS — E118 Type 2 diabetes mellitus with unspecified complications: Secondary | ICD-10-CM | POA: Insufficient documentation

## 2012-11-15 DIAGNOSIS — E119 Type 2 diabetes mellitus without complications: Secondary | ICD-10-CM | POA: Insufficient documentation

## 2012-11-15 DIAGNOSIS — E1122 Type 2 diabetes mellitus with diabetic chronic kidney disease: Secondary | ICD-10-CM | POA: Insufficient documentation

## 2012-12-14 ENCOUNTER — Encounter: Payer: Self-pay | Admitting: Physician Assistant

## 2012-12-14 ENCOUNTER — Ambulatory Visit (INDEPENDENT_AMBULATORY_CARE_PROVIDER_SITE_OTHER): Payer: Medicare PPO | Admitting: Physician Assistant

## 2012-12-14 VITALS — BP 153/74 | HR 89 | Wt 158.0 lb

## 2012-12-14 DIAGNOSIS — J40 Bronchitis, not specified as acute or chronic: Secondary | ICD-10-CM

## 2012-12-14 DIAGNOSIS — J329 Chronic sinusitis, unspecified: Secondary | ICD-10-CM

## 2012-12-14 DIAGNOSIS — R062 Wheezing: Secondary | ICD-10-CM

## 2012-12-14 MED ORDER — SULFAMETHOXAZOLE-TRIMETHOPRIM 800-160 MG PO TABS: 1.0000 | ORAL_TABLET | Freq: Two times a day (BID) | ORAL | Status: DC

## 2012-12-14 MED ORDER — CIPROFLOXACIN-DEXAMETHASONE 0.3-0.1 % OT SUSP: 4.0000 [drp] | Freq: Two times a day (BID) | OTIC | Status: DC

## 2012-12-14 MED ORDER — AZITHROMYCIN 250 MG PO TABS: ORAL_TABLET | ORAL | Status: DC

## 2012-12-14 MED ORDER — PREDNISONE 50 MG PO TABS: ORAL_TABLET | ORAL | Status: DC

## 2012-12-14 NOTE — Progress Notes (Signed)
  Subjective:    Patient ID: Lindsey Fischer, female    DOB: 1941/07/02, 72 y.o.   MRN: [id number]  Sinusitis This is a new problem. The current episode started 1 to 4 weeks ago. The problem has been rapidly worsening since onset. There has been no fever. Her pain is at a severity of 6/10. The pain is moderate. Associated symptoms include congestion, coughing, headaches, shortness of breath and sinus pressure. Pertinent negatives include no chills, diaphoresis, ear pain, hoarse voice, neck pain, sneezing, sore throat or swollen glands. Past treatments include acetaminophen, saline sprays and oral decongestants. The treatment provided mild relief.  Cough This is a new problem. The current episode started 1 to 4 weeks ago. The problem has been gradually worsening. The problem occurs hourly. The cough is productive of sputum. Associated symptoms include headaches, nasal congestion, postnasal drip, shortness of breath and wheezing. Pertinent negatives include no chills, ear congestion, ear pain, fever, heartburn, hemoptysis, myalgias, rash, rhinorrhea, sore throat, sweats or weight loss. The symptoms are aggravated by lying down (deep breaths). She has tried a beta-agonist inhaler and OTC cough suppressant for the symptoms. The treatment provided mild relief. Her past medical history is significant for asthma and bronchitis.     Review of Systems  Constitutional: Negative for fever, chills, weight loss and diaphoresis.  HENT: Positive for congestion, postnasal drip and sinus pressure. Negative for ear pain, sore throat, hoarse voice, rhinorrhea, sneezing and neck pain.   Respiratory: Positive for cough, shortness of breath and wheezing. Negative for hemoptysis.   Gastrointestinal: Negative for heartburn.  Musculoskeletal: Negative for myalgias.  Skin: Negative for rash.  Neurological: Positive for headaches.       Objective:   Physical Exam  Constitutional: She is oriented to person, place, and  time. She appears well-developed and well-nourished.  HENT:  Head: Normocephalic and atraumatic.  Right Ear: External ear normal.  Left Ear: External ear normal.  Mouth/Throat: Oropharynx is clear and moist. No oropharyngeal exudate.       TM's clear bilaterally.   Bilaterally maxillary tenderness.   Turbinates very erythematous.  Eyes: Conjunctivae normal are normal.  Neck: Normal range of motion. Neck supple.  Cardiovascular: Normal rate, regular rhythm and normal heart sounds.   Pulmonary/Chest:       Decreased effort. Starting coughing with each deep breath. Wheezing heard a apex of bilateral lungs with expiration. Dry hacking cough on today's exam.  Lymphadenopathy:    She has no cervical adenopathy.  Neurological: She is alert and oriented to person, place, and time.  Skin: Skin is warm and dry.  Psychiatric: She has a normal mood and affect. Her behavior is normal.          Assessment & Plan:  Sinusitis/Bronchitis- Gave zpak. Prednisone burst. Continue to use albuterol inhaler as needed. Start back on Advair. Gave handout. Can use mucinex. Call if not improving. Gave handout for symptomatic care. Stay away from decongestant due to BP increase.

## 2012-12-14 NOTE — Patient Instructions (Addendum)
Will give you 5 days of Prednisone. Start Zpak. Continue with albuterol. Start advair as soon as you can take a deep breath.

## 2013-05-17 ENCOUNTER — Ambulatory Visit (INDEPENDENT_AMBULATORY_CARE_PROVIDER_SITE_OTHER): Payer: Medicare PPO | Admitting: Physician Assistant

## 2013-05-17 ENCOUNTER — Encounter: Payer: Self-pay | Admitting: Physician Assistant

## 2013-05-17 VITALS — BP 127/65 | HR 76 | Wt 159.0 lb

## 2013-05-17 DIAGNOSIS — E119 Type 2 diabetes mellitus without complications: Secondary | ICD-10-CM

## 2013-05-17 DIAGNOSIS — J45909 Unspecified asthma, uncomplicated: Secondary | ICD-10-CM

## 2013-05-17 DIAGNOSIS — I10 Essential (primary) hypertension: Secondary | ICD-10-CM

## 2013-05-17 DIAGNOSIS — E785 Hyperlipidemia, unspecified: Secondary | ICD-10-CM

## 2013-05-17 DIAGNOSIS — R252 Cramp and spasm: Secondary | ICD-10-CM

## 2013-05-17 LAB — POCT UA - MICROALBUMIN
Creatinine, POC: 200 mg/dL
Microalbumin Ur, POC: 30 mg/L

## 2013-05-17 LAB — POCT GLYCOSYLATED HEMOGLOBIN (HGB A1C): Hemoglobin A1C: 6.3

## 2013-05-17 LAB — BASIC METABOLIC PANEL WITH GFR
Calcium: 9 mg/dL (ref 8.4–10.5)
Chloride: 107 mEq/L (ref 96–112)
Creat: 0.9 mg/dL (ref 0.50–1.10)
GFR, Est Non African American: 65 mL/min
Sodium: 142 mEq/L (ref 135–145)

## 2013-05-17 MED ORDER — FLUTICASONE-SALMETEROL 250-50 MCG/DOSE IN AEPB: 1.0000 | INHALATION_SPRAY | Freq: Two times a day (BID) | RESPIRATORY_TRACT | Status: DC

## 2013-05-17 MED ORDER — AMBULATORY NON FORMULARY MEDICATION: Status: DC

## 2013-05-17 MED ORDER — METFORMIN HCL 500 MG PO TABS: ORAL_TABLET | ORAL | Status: DC

## 2013-05-17 MED ORDER — LISINOPRIL-HYDROCHLOROTHIAZIDE 20-25 MG PO TABS: 1.0000 | ORAL_TABLET | Freq: Every day | ORAL | Status: DC

## 2013-05-17 NOTE — Patient Instructions (Addendum)
Need eye exam. Follow up 6 months.  Get shingles vaccine.  Get labs for cramping.

## 2013-05-18 NOTE — Progress Notes (Signed)
  Subjective:    Patient ID: Lindsey Fischer, female    DOB: [birth year], 72 y.o.   MRN: [id number]  HPI Patient presents to the clinic for a 6 month follow up.   DM- Pt has been doing really well. Denies any hypoglycemic episodes. Denies any symptoms of diabetes. She is keeping a diabetic diet and staying active. She does not check her sugars regularly.when she does in fasting and around 110 to 120.  She takes metformin daily.   HTN- Controlled on Zestoretic. She is doing well. No compliants.   Hyperlipidemia- Livalo and very controlled. Concerned that insurance company is not going to pay for it. Right now they continue to pay. No adverse symptoms. Dr. Thurmond Butts put on per pt because could not get controlled on other statins.   Asthma- Controlled on Advair. Not currently using nebulizer or inhaler regularly.   Pt is having some muscle cramps in thighs. Mostly at night. Trying to eat more bananas. Nothing seems to make worse or better. Working a lot outside in garden. Not always drinking enough.   Review of Systems     Objective:   Physical Exam  Constitutional: She is oriented to person, place, and time. She appears well-developed and well-nourished.  HENT:  Head: Normocephalic and atraumatic.  Eyes: Conjunctivae are normal.  Neck: Normal range of motion. Neck supple.  Cardiovascular: Normal rate.   Pulmonary/Chest: Effort normal and breath sounds normal. She has no wheezes.  Abdominal: Soft. Bowel sounds are normal.  Neurological: She is alert and oriented to person, place, and time. She has normal reflexes. No cranial nerve deficit.  Foot exam diabetic normal.  Skin: Skin is warm and dry.  Psychiatric: She has a normal mood and affect. Her behavior is normal.          Assessment & Plan:  DM- A1C was 6.3. Microalbumin controlled. Continue on therapy of metformin with diet and exercise. Will check CMP to make sure kidney function is good. Recommended shingles vaccine gave slip.  Reminded of eye exam. Diabetic foot exam normal today. Follow up in 6 months.   HTN- BP meds refilled.   Hyperlipidemia- Refilled livalo.   Muscle cramping- Will check CMP and electrolytes. Encourage to add some potassium rich foods and consider magnesium 500mg  daily. Stay hydrated. If continuing can do full work up.

## 2013-05-20 ENCOUNTER — Other Ambulatory Visit: Payer: Self-pay

## 2013-07-16 ENCOUNTER — Encounter: Payer: Self-pay | Admitting: Physician Assistant

## 2013-07-16 ENCOUNTER — Ambulatory Visit (INDEPENDENT_AMBULATORY_CARE_PROVIDER_SITE_OTHER): Payer: Medicare PPO | Admitting: Physician Assistant

## 2013-07-16 VITALS — BP 125/68 | HR 78 | Wt 157.0 lb

## 2013-07-16 DIAGNOSIS — Z0000 Encounter for general adult medical examination without abnormal findings: Secondary | ICD-10-CM

## 2013-07-16 DIAGNOSIS — Z23 Encounter for immunization: Secondary | ICD-10-CM

## 2013-07-16 DIAGNOSIS — I10 Essential (primary) hypertension: Secondary | ICD-10-CM

## 2013-07-16 DIAGNOSIS — R7301 Impaired fasting glucose: Secondary | ICD-10-CM

## 2013-07-16 DIAGNOSIS — E785 Hyperlipidemia, unspecified: Secondary | ICD-10-CM

## 2013-07-16 LAB — COMPLETE METABOLIC PANEL WITH GFR
AST: 17 U/L (ref 0–37)
Alkaline Phosphatase: 44 U/L (ref 39–117)
BUN: 28 mg/dL — ABNORMAL HIGH (ref 6–23)
GFR, Est Non African American: 59 mL/min — ABNORMAL LOW
Glucose, Bld: 103 mg/dL — ABNORMAL HIGH (ref 70–99)
Sodium: 139 mEq/L (ref 135–145)
Total Bilirubin: 0.3 mg/dL (ref 0.3–1.2)

## 2013-07-16 LAB — LIPID PANEL
Cholesterol: 234 mg/dL — ABNORMAL HIGH (ref 0–200)
HDL: 46 mg/dL (ref >39)
Total CHOL/HDL Ratio: 5.1 Ratio
Triglycerides: 500 mg/dL — ABNORMAL HIGH (ref <150)

## 2013-07-16 MED ORDER — SIMVASTATIN 20 MG PO TABS: 20.0000 mg | ORAL_TABLET | Freq: Every evening | ORAL | Status: DC

## 2013-07-16 NOTE — Progress Notes (Signed)
Subjective:    Lindsey Fischer is a 72 y.o. female who presents for Medicare Annual/Subsequent preventive examination.  Preventive Screening-Counseling & Management  Tobacco History  Smoking status  . Former Smoker  . Quit date: 11/11/1980  Smokeless tobacco  . Never Used     Problems Prior to Visit 1.   Current Problems (verified) Patient Active Problem List   Diagnosis Date Noted  . DM (diabetes mellitus) 11/15/2012  . Metabolic syndrome X 03/05/2012  . GRIEF REACTION, ACUTE 03/27/2010  . ESSENTIAL HYPERTENSION, BENIGN 03/27/2010  . IMPAIRED FASTING GLUCOSE 03/27/2010  . DYSLIPIDEMIA 01/19/2010  . ASTHMA, UNSPECIFIED, UNSPECIFIED STATUS 01/19/2010    Medications Prior to Visit Current Outpatient Prescriptions on File Prior to Visit  Medication Sig Dispense Refill  . albuterol (PROAIR HFA) 108 (90 BASE) MCG/ACT inhaler Inhale 2 puffs into the lungs daily.  18 g  6  . AMBULATORY NON FORMULARY MEDICATION Nebulizer machine and tubing  Dx:  COPD exacerbation  1 Units  0  . AMBULATORY NON FORMULARY MEDICATION zostavax Im injection once.  1 application  0  . ferrous sulfate 325 (65 FE) MG tablet Take 325 mg by mouth daily with breakfast.      . Fluticasone-Salmeterol (ADVAIR DISKUS) 250-50 MCG/DOSE AEPB Inhale 1 puff into the lungs every 12 (twelve) hours.  180 each  3  . ipratropium-albuterol (DUONEB) 0.5-2.5 (3) MG/3ML SOLN Take 3 mLs by nebulization every 6 (six) hours as needed.  360 mL  0  . lisinopril-hydrochlorothiazide (PRINZIDE,ZESTORETIC) 20-25 MG per tablet Take 1 tablet by mouth daily.  30 tablet  6  . metFORMIN (GLUCOPHAGE) 500 MG tablet Take 1 tablet a day.  30 tablet  6  . ONE TOUCH ULTRA TEST test strip USE AS DIRECTED TO CHECK BLOOD SUGAR TWICE A DAY.  100 each  4   No current facility-administered medications on file prior to visit.    Current Medications (verified) Current Outpatient Prescriptions  Medication Sig Dispense Refill  . albuterol (PROAIR  HFA) 108 (90 BASE) MCG/ACT inhaler Inhale 2 puffs into the lungs daily.  18 g  6  . AMBULATORY NON FORMULARY MEDICATION Nebulizer machine and tubing  Dx:  COPD exacerbation  1 Units  0  . AMBULATORY NON FORMULARY MEDICATION zostavax Im injection once.  1 application  0  . ferrous sulfate 325 (65 FE) MG tablet Take 325 mg by mouth daily with breakfast.      . Fluticasone-Salmeterol (ADVAIR DISKUS) 250-50 MCG/DOSE AEPB Inhale 1 puff into the lungs every 12 (twelve) hours.  180 each  3  . ipratropium-albuterol (DUONEB) 0.5-2.5 (3) MG/3ML SOLN Take 3 mLs by nebulization every 6 (six) hours as needed.  360 mL  0  . lisinopril-hydrochlorothiazide (PRINZIDE,ZESTORETIC) 20-25 MG per tablet Take 1 tablet by mouth daily.  30 tablet  6  . magnesium 30 MG tablet Take 30 mg by mouth daily.      . metFORMIN (GLUCOPHAGE) 500 MG tablet Take 1 tablet a day.  30 tablet  6  . ONE TOUCH ULTRA TEST test strip USE AS DIRECTED TO CHECK BLOOD SUGAR TWICE A DAY.  100 each  4   No current facility-administered medications for this visit.     Allergies (verified) Peanut-containing drug products   PAST HISTORY  Family History Family History  Problem Relation Age of Onset  . Diabetes Mother   . Heart attack Father 33    deceased  . Cancer Sister     brain tumor  .  Cancer Brother     prostrate  . COPD Sister     Social History History  Substance Use Topics  . Smoking status: Former Smoker    Quit date: 11/11/1980  . Smokeless tobacco: Never Used  . Alcohol Use: No     Are there smokers in your home (other than you)? No  Risk Factors Current exercise habits: The patient does not participate in regular exercise at present.  Dietary issues discussed: none   Cardiac risk factors: advanced age (older than 13 for men, 60 for women), dyslipidemia, hypertension and impaired fasting glucose.  Depression Screen (Note: if answer to either of the following is "Yes", a more complete depression screening is  indicated)   Over the past two weeks, have you felt down, depressed or hopeless? No  Over the past two weeks, have you felt little interest or pleasure in doing things? No  Have you lost interest or pleasure in daily life? No  Do you often feel hopeless? No  Do you cry easily over simple problems? No  Activities of Daily Living In your present state of health, do you have any difficulty performing the following activities?:  Driving? No Managing money?  No Feeding yourself? No Getting from bed to chair? No Climbing a flight of stairs? No Preparing food and eating?: No Bathing or showering? No Getting dressed: No Getting to the toilet? No Using the toilet:No Moving around from place to place: No In the past year have you fallen or had a near fall?:She did trip a week ago over a cord in the floor.   Are you sexually active?  No  Do you have more than one partner?  No  Hearing Difficulties: No Do you often ask people to speak up or repeat themselves? Sometimes but she feels like only when people do not talk clearly. She has no problem hearing me.  Do you experience ringing or noises in your ears? No Do you have difficulty understanding soft or whispered voices? No   Do you feel that you have a problem with memory? No  Do you often misplace items? No  Do you feel safe at home?  Yes  Cognitive Testing  Alert? Yes  Normal Appearance?Yes  Oriented to person? Yes  Place? Yes   Time? Yes  Recall of three objects?  Yes  Can perform simple calculations? Yes  Displays appropriate judgment?Yes  Can read the correct time from a watch face?Yes   Advanced Directives have been discussed with the patient? No  List the Names of Other Physician/Practitioners you currently use: 1.    Indicate any recent Medical Services you may have received from other than [facility] providers in the past year (date may be approximate).  Immunization History  Administered Date(s) Administered  . Influenza  Whole 09/11/2010  . Pneumococcal Polysaccharide 09/11/2010  . Tdap 03/05/2012    Screening Tests Health Maintenance  Topic Date Due  . Foot Exam  08/15/1951  . Ophthalmology Exam  08/15/1951  . Zostavax  08/14/2001  . Influenza Vaccine  06/11/2013  . Hemoglobin A1c  11/17/2013  . Urine Microalbumin  05/17/2014  . Colonoscopy  06/11/2017  . Tetanus/tdap  03/05/2022  . Pneumococcal Polysaccharide Vaccine Age 53 And Over  Completed    All answers were reviewed with the patient and necessary referrals were made:  Tandy Gaw, PA-C   07/16/2013   History reviewed: allergies, current medications, past family history, past medical history, past social history, past surgical history and  problem list  Review of Systems A comprehensive review of systems was negative.    Objective:     Vision by Snellen chart: right eye:20/40, left eye:20/25  Body mass index is 26.13 kg/(m^2). BP 125/68  Pulse 78  Wt 157 lb (71.215 kg)  BMI 26.13 kg/m2  BP 125/68  Pulse 78  Wt 157 lb (71.215 kg)  BMI 26.13 kg/m2  General Appearance:    Alert, cooperative, no distress, appears stated age  Head:    Normocephalic, without obvious abnormality, atraumatic  Eyes:    PERRL, conjunctiva/corneas clear, EOM's intact, fundi    benign, both eyes  Ears:    Normal TM's and external ear canals, both ears  Nose:   Nares normal, septum midline, mucosa normal, no drainage    or sinus tenderness  Throat:   Lips, mucosa, and tongue normal; teeth and gums normal  Neck:   Supple, symmetrical, trachea midline, no adenopathy;    thyroid:  no enlargement/tenderness/nodules; no carotid   bruit or JVD  Back:     Symmetric, no curvature, ROM normal, no CVA tenderness  Lungs:     Clear to auscultation bilaterally, respirations unlabored  Chest Wall:    No tenderness or deformity   Heart:    Regular rate and rhythm, S1 and S2 normal, no murmur, rub   or gallop  Breast Exam:    No tenderness, masses, or nipple  abnormality  Abdomen:     Soft, non-tender, bowel sounds active all four quadrants,    no masses, no organomegaly  Genitalia:    Pt declined.  Rectal:    Pt Declined.  Extremities:   Extremities normal, atraumatic, no cyanosis or edema  Pulses:   2+ and symmetric all extremities  Skin:   Skin color, texture, turgor normal, no rashes or lesions  Lymph nodes:   Cervical, supraclavicular, and axillary nodes normal  Neurologic:   CNII-XII intact, normal strength, sensation and reflexes    throughout       Assessment:     Healthy Medicare Wellness Exam      Plan:     During the course of the visit the patient was educated and counseled about appropriate screening and preventive services including:    Influenza vaccine  Shingles Pt declines because insurance will not pay.   Pt is too soon for A!C. Will recheck in 6 months. Last A1C was 6.3. Will check Cholesterol today. Pt cannot afford livalo. Does not remember side effects from other statins. Will try zocor low dose and see if pt tolerates. If get muscle cramping call office.   Pt is up to date on mammogram and bone density. Will get every 2 years.  colonoscopy up to date.   Pt aware she needs an opthalmology appt.   Diet review for nutrition referral?Not Indicated   Patient Instructions (the written plan) was given to the patient.  Medicare Attestation I have personally reviewed: The patient's medical and social history Their use of alcohol, tobacco or illicit drugs Their current medications and supplements The patient's functional ability including ADLs,fall risks, home safety risks, cognitive, and hearing and visual impairment Diet and physical activities Evidence for depression or mood disorders  The patient's weight, height, BMI, and visual acuity have been recorded in the chart.  I have made referrals, counseling, and provided education to the patient based on review of the above and I have provided the patient  with a written personalized care plan for preventive services.  Tandy Gaw, PA-C   07/16/2013

## 2013-07-16 NOTE — Patient Instructions (Addendum)
Stop livalo. Start Zocor at night.   Keeping You Healthy  Get These Tests  Blood Pressure- Have your blood pressure checked by your healthcare provider at least once a year.  Normal blood pressure is 120/80.  Weight- Have your body mass index (BMI) calculated to screen for obesity.  BMI is a measure of body fat based on height and weight.  You can calculate your own BMI at https://www.west-esparza.com/  Cholesterol- Have your cholesterol checked every year.  Diabetes- Have your blood sugar checked every year if you have high blood pressure, high cholesterol, a family history of diabetes or if you are overweight.  Pap Smear- Have a pap smear every 1 to 3 years if you have been sexually active.  If you are older than 65 and recent pap smears have been normal you may not need additional pap smears.  In addition, if you have had a hysterectomy  For benign disease additional pap smears are not necessary.  Mammogram-Yearly mammograms are essential for early detection of breast cancer  Screening for Colon Cancer- Colonoscopy starting at age 74. Screening may begin sooner depending on your family history and other health conditions.  Follow up colonoscopy as directed by your Gastroenterologist.  Screening for Osteoporosis- Screening begins at age 75 with bone density scanning, sooner if you are at higher risk for developing Osteoporosis.  Get these medicines  Calcium with Vitamin D- Your body requires 1200-1500 mg of Calcium a day and [phone] IU of Vitamin D a day.  You can only absorb 500 mg of Calcium at a time therefore Calcium must be taken in 2 or 3 separate doses throughout the day.  Hormones- Hormone therapy has been associated with increased risk for certain cancers and heart disease.  Talk to your healthcare provider about if you need relief from menopausal symptoms.  Aspirin- Ask your healthcare provider about taking Aspirin to prevent Heart Disease and Stroke.  Get these  Immuniztions  Flu shot- Every fall  Pneumonia shot- Once after the age of 25; if you are younger ask your healthcare provider if you need a pneumonia shot.  Tetanus- Every ten years.  Zostavax- Once after the age of 75 to prevent shingles.  Take these steps  Don't smoke- Your healthcare provider can help you quit. For tips on how to quit, ask your healthcare provider or go to www.smokefree.gov or call 1-800 QUIT-NOW.  Be physically active- Exercise 5 days a week for a minimum of 30 minutes.  If you are not already physically active, start slow and gradually work up to 30 minutes of moderate physical activity.  Try walking, dancing, bike riding, swimming, etc.  Eat a healthy diet- Eat a variety of healthy foods such as fruits, vegetables, whole grains, low fat milk, low fat cheeses, yogurt, lean meats, chicken, fish, eggs, dried beans, tofu, etc.  For more information go to www.thenutritionsource.org  Dental visit- Brush and floss teeth twice daily; visit your dentist twice a year.  Eye exam- Visit your Optometrist or Ophthalmologist yearly.  Drink alcohol in moderation- Limit alcohol intake to one drink or less a day.  Never drink and drive.  Depression- Your emotional health is as important as your physical health.  If you're feeling down or losing interest in things you normally enjoy, please talk to your healthcare provider.  Seat Belts- can save your life; always wear one  Smoke/Carbon Monoxide detectors- These detectors need to be installed on the appropriate level of your home.  Replace batteries at  least once a year.  Violence- If anyone is threatening or hurting you, please tell your healthcare provider.  Living Will/ Health care power of attorney- Discuss with your healthcare provider and family.

## 2013-08-04 ENCOUNTER — Telehealth: Payer: Self-pay | Admitting: *Deleted

## 2013-08-04 NOTE — Telephone Encounter (Signed)
Opened in error

## 2013-09-15 ENCOUNTER — Other Ambulatory Visit: Payer: Self-pay | Admitting: Family Medicine

## 2013-09-15 DIAGNOSIS — Z1231 Encounter for screening mammogram for malignant neoplasm of breast: Secondary | ICD-10-CM

## 2013-09-21 ENCOUNTER — Ambulatory Visit (INDEPENDENT_AMBULATORY_CARE_PROVIDER_SITE_OTHER): Payer: Medicare PPO

## 2013-09-21 DIAGNOSIS — Z1231 Encounter for screening mammogram for malignant neoplasm of breast: Secondary | ICD-10-CM

## 2013-12-06 ENCOUNTER — Emergency Department (INDEPENDENT_AMBULATORY_CARE_PROVIDER_SITE_OTHER): Payer: Medicare PPO

## 2013-12-06 ENCOUNTER — Encounter: Payer: Self-pay | Admitting: Emergency Medicine

## 2013-12-06 ENCOUNTER — Emergency Department (INDEPENDENT_AMBULATORY_CARE_PROVIDER_SITE_OTHER)
Admission: EM | Admit: 2013-12-06 | Discharge: 2013-12-06 | Disposition: A | Discharge status: Home / Self Care | Payer: Medicare PPO | Source: Home / Self Care | Attending: Family Medicine | Admitting: Family Medicine

## 2013-12-06 DIAGNOSIS — R0989 Other specified symptoms and signs involving the circulatory and respiratory systems: Secondary | ICD-10-CM

## 2013-12-06 DIAGNOSIS — J45909 Unspecified asthma, uncomplicated: Secondary | ICD-10-CM

## 2013-12-06 DIAGNOSIS — R059 Cough, unspecified: Secondary | ICD-10-CM

## 2013-12-06 DIAGNOSIS — R05 Cough: Secondary | ICD-10-CM

## 2013-12-06 DIAGNOSIS — D649 Anemia, unspecified: Secondary | ICD-10-CM

## 2013-12-06 DIAGNOSIS — R509 Fever, unspecified: Secondary | ICD-10-CM

## 2013-12-06 LAB — POCT CBC W AUTO DIFF (K'VILLE URGENT CARE)

## 2013-12-06 MED ORDER — CEFDINIR 300 MG PO CAPS: 300.0000 mg | ORAL_CAPSULE | Freq: Two times a day (BID) | ORAL | Status: DC

## 2013-12-06 MED ORDER — IPRATROPIUM-ALBUTEROL 0.5-2.5 (3) MG/3ML IN SOLN: 3.0000 mL | RESPIRATORY_TRACT | Status: DC

## 2013-12-06 MED ORDER — IPRATROPIUM-ALBUTEROL 0.5-2.5 (3) MG/3ML IN SOLN
3.0000 mL | Freq: Once | RESPIRATORY_TRACT | Status: AC
Start: 2013-12-06 — End: 2013-12-06
  Administered 2013-12-06: 3 mL via RESPIRATORY_TRACT

## 2013-12-06 MED ORDER — IPRATROPIUM-ALBUTEROL 0.5-2.5 (3) MG/3ML IN SOLN: 3.0000 mL | Freq: Four times a day (QID) | RESPIRATORY_TRACT | Status: DC | PRN

## 2013-12-06 MED ORDER — METHYLPREDNISOLONE ACETATE 40 MG/ML IJ SUSP
40.0000 mg | Freq: Once | INTRAMUSCULAR | Status: AC
  Administered 2013-12-06: 40 mg via INTRAMUSCULAR

## 2013-12-06 MED ORDER — PREDNISONE 20 MG PO TABS: 20.0000 mg | ORAL_TABLET | Freq: Two times a day (BID) | ORAL | Status: DC

## 2013-12-06 MED ORDER — CEFTRIAXONE SODIUM 1 G IJ SOLR
1.0000 g | Freq: Once | INTRAMUSCULAR | Status: AC
  Administered 2013-12-06: 1 g via INTRAMUSCULAR

## 2013-12-06 NOTE — ED Provider Notes (Signed)
CSN: [id number]     Arrival date & time 12/06/13  0915 History   First MD Initiated Contact with Patient 12/06/13 [phone]     Chief Complaint  Patient presents with  . Cough      HPI Comments: Patient developed a sore throat and chest congestion, but minimal sinus congestion, six days ago.  Over the past several days she has developed increased wheezing and shortness of breath with activity.  Yesterday she developed fever to 103.2.  She has been using her nebulizer twice daily with DuoNeb, with mild improvement.  Her sputum has become a dark brown color.  No pleuritic pain. She has had pneumonia in the past (she had pneumococcal vaccine in 2020).  The history is provided by the patient.    Past Medical History  Diagnosis Date  . Asthma   . Hypertension   . Hyperlipidemia   . Diabetes mellitus     pre diabetes   Past Surgical History  Procedure Laterality Date  . Ovarian cyst removal    . Appendectomy     Family History  Problem Relation Age of Onset  . Diabetes Mother   . Heart attack Father 28    deceased  . Cancer Sister     brain tumor  . Cancer Brother     prostrate and lung CA  . COPD Sister    History  Substance Use Topics  . Smoking status: Former Smoker    Quit date: 11/11/1980  . Smokeless tobacco: Never Used  . Alcohol Use: No   OB History   Grav Para Term Preterm Abortions TAB SAB Ect Mult Living                 Review of Systems + sore throat + cough No pleuritic pain, but has tightness in anterior chest + wheezing No nasal congestion No post-nasal drainage No sinus pain/pressure No itchy/red eyes No earache No hemoptysis + SOB + fever, + chills No nausea No vomiting No abdominal pain No diarrhea No urinary symptoms No skin rash + fatigue No myalgias No headache    Allergies  Peanut-containing drug products  Home Medications   Current Outpatient Rx  Name  Route  Sig  Dispense  Refill  . albuterol (PROAIR HFA) 108 (90 BASE)  MCG/ACT inhaler   Inhalation   Inhale 2 puffs into the lungs daily.   18 g   6     May substitute for any albuterol inhaler that migh ...   . AMBULATORY NON FORMULARY MEDICATION      Nebulizer machine and tubing  Dx:  COPD exacerbation   1 Units   0   . AMBULATORY NON FORMULARY MEDICATION      zostavax Im injection once.   1 application   0   . cefdinir (OMNICEF) 300 MG capsule   Oral   Take 1 capsule (300 mg total) by mouth 2 (two) times daily.   20 capsule   0   . ferrous sulfate 325 (65 FE) MG tablet   Oral   Take 325 mg by mouth daily with breakfast.         . Fluticasone-Salmeterol (ADVAIR DISKUS) 250-50 MCG/DOSE AEPB   Inhalation   Inhale 1 puff into the lungs every 12 (twelve) hours.   180 each   3   . ipratropium-albuterol (DUONEB) 0.5-2.5 (3) MG/3ML SOLN   Nebulization   Take 3 mLs by nebulization every 6 (six) hours as needed.   360 mL  0   . lisinopril-hydrochlorothiazide (PRINZIDE,ZESTORETIC) 20-25 MG per tablet   Oral   Take 1 tablet by mouth daily.   30 tablet   6   . magnesium 30 MG tablet   Oral   Take 30 mg by mouth daily.         . metFORMIN (GLUCOPHAGE) 500 MG tablet      Take 1 tablet a day.   30 tablet   6   . ONE TOUCH ULTRA TEST test strip      USE AS DIRECTED TO CHECK BLOOD SUGAR TWICE A DAY.   100 each   4   . predniSONE (DELTASONE) 20 MG tablet   Oral   Take 1 tablet (20 mg total) by mouth 2 (two) times daily. Take with food.   10 tablet   0   . simvastatin (ZOCOR) 20 MG tablet   Oral   Take 1 tablet (20 mg total) by mouth every evening.   30 tablet   11    BP 142/71  Pulse 97  Temp(Src) 98.6 F (37 C) (Oral)  Resp 18  Ht 5\' 3"  (1.6 m)  Wt 156 lb (70.761 kg)  BMI 27.64 kg/m2  SpO2 93% Physical Exam Nursing notes and Vital Signs reviewed. Appearance:  Patient appears healthy, stated age, and in no acute distress.  She is alert and oriented.  Eyes:  Pupils are equal, round, and reactive to light  and accomodation.  Extraocular movement is intact.  Conjunctivae are not inflamed  Ears:  Canals normal.  Tympanic membranes normal.  Nose:  Normal turbinates.  No sinus tenderness.   Pharynx:  Normal Neck:  Supple.   Tender shotty posterior nodes are palpated bilaterally  Lungs:   Decreased breath sounds posteriorly.  Inspiratory and expiratory bilateral musical wheezes anteriorly.  Breath sounds are equal.  Heart:  Regular rate and rhythm without murmurs, rubs, or gallops.  Abdomen:  Nontender without masses or hepatosplenomegaly.  Bowel sounds are present.  No CVA or flank tenderness.  Extremities:  No edema.  No calf tenderness Skin:  No rash present.   ED Course  Procedures  none    Labs Reviewed  POCT CBC W AUTO DIFF ([facility]):  WBC 14.9; LY 13.8; MO 6.5; GR 79.7; Hgb 11.7; Platelets 218   Imaging Review Dg Chest 2 View  12/06/2013   CLINICAL DATA:  Cough and fever with congestion.  EXAM: CHEST  2 VIEW  COMPARISON:  DG CHEST 2 VIEW dated 05/06/2011  FINDINGS: Trachea is midline. Heart size stable. Lungs are hyperinflated with scarring in the right middle lobe and lingula. No airspace consolidation or pleural fluid. Degenerative changes are seen in the spine.  IMPRESSION: Hyperinflation without acute finding.   Electronically Signed   By: Lorin Picket M.D.   On: 12/06/2013 10:42      MDM   1. Cough   2. Asthmatic bronchitis   3. Anemia; note normal Hgb 12.5 on 07/07/12    DuoNeb administered by nebulizer.  SoluMedrol 40mg  IM.  Rocephin 1gm IM Tomorrow begin Omnicef, and prednisone burst. Refill DuoNeb. Take plain Mucinex (1200 mg guaifenesin) twice daily for cough and congestion.   Increase fluid intake, rest. Try warm salt water gargles for sore throat.  Stop all antihistamines for now, and other non-prescription cough/cold preparations. Continue Advair, and nebulizer as needed. Follow-up with family doctor in 87 hours.  Followup also for anemia. If symptoms  become significantly worse during the night or over  the weekend, proceed to the local emergency room.     Kandra Nicolas, MD 12/06/13 [phone]

## 2013-12-06 NOTE — ED Notes (Signed)
F/u with PCP Lindsey Fischer made for 12/08/13 @ 9am. Apt given to pt while in office.

## 2013-12-06 NOTE — ED Notes (Signed)
Lindsey Fischer c/o productive cough, brown mucous, congestion, fatigue and fever x 2 days. Reports T-max lst night of 103.5. No fever today. Reports SOB with exertion only but I observed SOB at rest and with talking to me. Hx of asthma. Used Neb tx yesterday. Rec'd PNA vac and fluc vac. Wheezing auscultated on left lobes.

## 2013-12-06 NOTE — Discharge Instructions (Signed)
Take plain Mucinex (1200 mg guaifenesin) twice daily for cough and congestion.   Increase fluid intake, rest. Try warm salt water gargles for sore throat.  Stop all antihistamines for now, and other non-prescription cough/cold preparations. Continue Advair, and nebulizer as needed. Follow-up with family doctor in 76 hours. If symptoms become significantly worse during the night or over the weekend, proceed to the local emergency room.    Anemia, Nonspecific Anemia is a condition in which the concentration of red blood cells or hemoglobin in the blood is below normal. Hemoglobin is a substance in red blood cells that carries oxygen to the tissues of the body. Anemia results in not enough oxygen reaching these tissues.  CAUSES  Common causes of anemia include:   Excessive bleeding. Bleeding may be internal or external. This includes excessive bleeding from periods (in women) or from the intestine.   Poor nutrition.   Chronic kidney, thyroid, and liver disease.  Bone marrow disorders that decrease red blood cell production.  Cancer and treatments for cancer.  HIV, AIDS, and their treatments.  Spleen problems that increase red blood cell destruction.  Blood disorders.  Excess destruction of red blood cells due to infection, medicines, and autoimmune disorders. SIGNS AND SYMPTOMS   Minor weakness.   Dizziness.   Headache.  Palpitations.   Shortness of breath, especially with exercise.   Paleness.  Cold sensitivity.  Indigestion.  Nausea.  Difficulty sleeping.  Difficulty concentrating. Symptoms may occur suddenly or they may develop slowly.  DIAGNOSIS  Additional blood tests are often needed. These help your health care provider determine the best treatment. Your health care provider will check your stool for blood and look for other causes of blood loss.  TREATMENT  Treatment varies depending on the cause of the anemia. Treatment can include:    Supplements of iron, vitamin X41, or folic acid.   Hormone medicines.   A blood transfusion. This may be needed if blood loss is severe.   Hospitalization. This may be needed if there is significant continual blood loss.   Dietary changes.  Spleen removal. HOME CARE INSTRUCTIONS Keep all follow-up appointments. It often takes many weeks to correct anemia, and having your health care provider check on your condition and your response to treatment is very important. SEEK IMMEDIATE MEDICAL CARE IF:   You develop extreme weakness, shortness of breath, or chest pain.   You become dizzy or have trouble concentrating.  You develop heavy vaginal bleeding.   You develop a rash.   You have bloody or black, tarry stools.   You faint.   You vomit up blood.   You vomit repeatedly.   You have abdominal pain.  You have a fever or persistent symptoms for more than 2 3 days.   You have a fever and your symptoms suddenly get worse.   You are dehydrated.  MAKE SURE YOU:  Understand these instructions.  Will watch your condition.  Will get help right away if you are not doing well or get worse. Document Released: 12/05/2004 Document Revised: 06/30/2013 Document Reviewed: 04/23/2013 [facility] Patient Information 2014 [place].

## 2013-12-08 ENCOUNTER — Ambulatory Visit (INDEPENDENT_AMBULATORY_CARE_PROVIDER_SITE_OTHER): Payer: Medicare PPO | Admitting: Physician Assistant

## 2013-12-08 ENCOUNTER — Encounter: Payer: Self-pay | Admitting: Physician Assistant

## 2013-12-08 ENCOUNTER — Telehealth: Payer: Self-pay | Admitting: Physician Assistant

## 2013-12-08 VITALS — BP 139/67 | HR 87 | Temp 98.1°F | Resp 16 | Wt 159.0 lb

## 2013-12-08 DIAGNOSIS — J45909 Unspecified asthma, uncomplicated: Secondary | ICD-10-CM

## 2013-12-08 DIAGNOSIS — R05 Cough: Secondary | ICD-10-CM

## 2013-12-08 DIAGNOSIS — R059 Cough, unspecified: Secondary | ICD-10-CM

## 2013-12-08 DIAGNOSIS — E785 Hyperlipidemia, unspecified: Secondary | ICD-10-CM

## 2013-12-08 LAB — LIPID PANEL
CHOL/HDL RATIO: 3 ratio
Cholesterol: 202 mg/dL — ABNORMAL HIGH (ref 0–200)
HDL: 67 mg/dL (ref >39)
LDL Cholesterol: 109 mg/dL — ABNORMAL HIGH (ref 0–99)
Triglycerides: 131 mg/dL (ref <150)
VLDL: 26 mg/dL (ref 0–40)

## 2013-12-08 MED ORDER — BENZONATATE 100 MG PO CAPS: 100.0000 mg | ORAL_CAPSULE | Freq: Three times a day (TID) | ORAL | Status: DC | PRN

## 2013-12-08 NOTE — Telephone Encounter (Signed)
Pt called back & was notified to take zocor every other day & to call me back in 2-3 weeks.

## 2013-12-08 NOTE — Patient Instructions (Signed)
Lecithin 400mg  three times a day.   Will call with lipid level.

## 2013-12-08 NOTE — Progress Notes (Signed)
   Subjective:    Patient ID: Lindsey Fischer, female    DOB: December 15, 1940, 73 y.o.   MRN: [id number]  HPI The patient is a 73 year old white female who presents to the clinic to followup after urgent care visit on 12/06/2013. She was diagnosed with asthmatic bronchitis and given shot of Rocephin in office as well as Septra to be sent home, prednisone shot in office as well as prednisone to be sent home. Patient continues to take the prednisone and has 3 days left. She continues to take the antibiotic and has 7 day flow. She continues to use DuoNeb 3 times a day. She's taking Advair twice a day. She is still in 80% better today. She still has a cough that is mostly dry at this point. Denies any fever, chills. She worked yesterday and did fine.  hyperlipdiemia- zocor for over 4 months and still having muscle cramps. At night only. Went away when off statins for 1 month. Tried adding magnesium/potassium and did not help.    Review of Systems     Objective:   Physical Exam  Constitutional: She is oriented to person, place, and time. She appears well-developed and well-nourished.  HENT:  Head: Normocephalic and atraumatic.  Right Ear: External ear normal.  Left Ear: External ear normal.  Mouth/Throat: Oropharynx is clear and moist.  Eyes: Conjunctivae are normal. Right eye exhibits no discharge. Left eye exhibits no discharge.  Neck: Normal range of motion. Neck supple.  Cardiovascular: Normal rate, regular rhythm and normal heart sounds.   Pulmonary/Chest: Effort normal and breath sounds normal. She has no wheezes.  Pulse ox 94%.  Lymphadenopathy:    She has no cervical adenopathy.  Neurological: She is alert and oriented to person, place, and time.  Skin: Skin is dry.  Psychiatric: She has a normal mood and affect. Her behavior is normal.          Assessment & Plan:  Asthmatic bronchitis-reassured patient that she seems to be doing much better today. Pulse ox is 94% which is 1% up from  urgent care. Her lungs sound great. Continue with prednisone, antibiotics. I would decrease to Annette down to twice a day and then as improving once a day until not using any more. Continue with Advair twice a day for maintenance. For ongoing cough I did send over her Tessalon Perles to use. Call with any worsening of symptoms.  Hyperlipidemia-we have not checked lipid level in a while. We'll check today. I do not feel comfortable with patient continuing on Zocor with muscle cramps. We will try every other day taking zocor and see if muscle cramps resolve. Ok to try lecthin 400mg  three times a day. Will consider adding welchol in near future if not at goal.Pt made aware importance of statin to decrease cardiovascular event.

## 2013-12-08 NOTE — Telephone Encounter (Signed)
Please call pt: I want pt to try zocor every other day and see if helps with cramps. Call in next 2-3 weeks and see if any improvement.

## 2013-12-08 NOTE — Telephone Encounter (Signed)
LMOM for pt to return my call.

## 2013-12-14 ENCOUNTER — Other Ambulatory Visit: Payer: Self-pay | Admitting: Physician Assistant

## 2014-01-14 ENCOUNTER — Encounter: Payer: Self-pay | Admitting: Physician Assistant

## 2014-01-14 ENCOUNTER — Ambulatory Visit (INDEPENDENT_AMBULATORY_CARE_PROVIDER_SITE_OTHER): Payer: Medicare PPO | Admitting: Physician Assistant

## 2014-01-14 VITALS — BP 134/69 | HR 74 | Wt 158.0 lb

## 2014-01-14 DIAGNOSIS — I10 Essential (primary) hypertension: Secondary | ICD-10-CM

## 2014-01-14 DIAGNOSIS — E119 Type 2 diabetes mellitus without complications: Secondary | ICD-10-CM

## 2014-01-14 DIAGNOSIS — E785 Hyperlipidemia, unspecified: Secondary | ICD-10-CM

## 2014-01-14 LAB — POCT GLYCOSYLATED HEMOGLOBIN (HGB A1C): Hemoglobin A1C: 6.3

## 2014-01-14 NOTE — Patient Instructions (Signed)
Prevnar and Zostavax.

## 2014-01-16 NOTE — Progress Notes (Signed)
   Subjective:    Patient ID: Lindsey Fischer, female    DOB: 1940-12-08, 74 y.o.   MRN: [id number]  HPI Pt comes in for 6 month follow up on HTN/DM/Dyslipidemia.   HTN- denies any CP, palpitations, headaches or vision changes. Takes medication daily.   DM- does not regularly check sugars. Denies any hypoglycemic events. No open sores or unhealed wounds. UTD eye exam. No neuropathy. Feeling great. Takes metformin daily. Staying active. On ACE.   Dyslipidemia- she was previously having a lot of side effects with muscle cramping from statin. She started taking statin in morning and we switched dose and doing great. She is taking daily. Even though we had previously discussed every other day to see if she could tolerate.    Review of Systems     Objective:   Physical Exam  Constitutional: She is oriented to person, place, and time. She appears well-developed and well-nourished.  HENT:  Head: Normocephalic and atraumatic.  Cardiovascular: Normal rate, regular rhythm and normal heart sounds.   Pulmonary/Chest: Effort normal and breath sounds normal. She has no wheezes.  Neurological: She is alert and oriented to person, place, and time.  Skin: Skin is dry.  Psychiatric: She has a normal mood and affect. Her behavior is normal.          Assessment & Plan:  DM, controlled- .Marland Kitchen Lab Results  Component Value Date   HGBA1C 6.3 01/14/2014   Pt is running a stable A1C. Continue to keep balanced diet and take metformin daily. Need for prevnar. Discussed with pt today. She is going to consider and perhaps get at next visit. Stay active. Need zostavax as well. Pt is aware and has been given rx for it.   Dyslipidemia- pt is able to tolerate zocor daily in am. Continue on statin. Will monitor at cPE.   HTN- BP looking great. Continue on current dose.

## 2014-02-19 ENCOUNTER — Other Ambulatory Visit: Payer: Self-pay | Admitting: Physician Assistant

## 2014-03-26 ENCOUNTER — Other Ambulatory Visit: Payer: Self-pay | Admitting: Physician Assistant

## 2014-03-30 ENCOUNTER — Telehealth: Payer: Self-pay | Admitting: Physician Assistant

## 2014-03-30 NOTE — Telephone Encounter (Signed)
Per pt she reported colonoscopy in 2008 find out where and get copy to scan into chart. If she has not we need to get a colonoscopy.

## 2014-03-30 NOTE — Telephone Encounter (Signed)
Left detailed message for patient to return call with information.

## 2014-04-22 ENCOUNTER — Ambulatory Visit (INDEPENDENT_AMBULATORY_CARE_PROVIDER_SITE_OTHER): Payer: Medicare PPO | Admitting: Physician Assistant

## 2014-04-22 ENCOUNTER — Encounter: Payer: Self-pay | Admitting: Physician Assistant

## 2014-04-22 VITALS — BP 131/71 | HR 69 | Ht 63.0 in | Wt 159.0 lb

## 2014-04-22 DIAGNOSIS — M25512 Pain in left shoulder: Secondary | ICD-10-CM

## 2014-04-22 DIAGNOSIS — M25519 Pain in unspecified shoulder: Secondary | ICD-10-CM

## 2014-04-22 MED ORDER — MELOXICAM 15 MG PO TABS: ORAL_TABLET | ORAL | Status: DC

## 2014-04-22 NOTE — Patient Instructions (Signed)
Bursitis Bursitis is a swelling and soreness (inflammation) of a fluid-filled sac (bursa) that overlies and protects a joint. It can be caused by injury, overuse of the joint, arthritis or infection. The joints most likely to be affected are the elbows, shoulders, hips and knees. HOME CARE INSTRUCTIONS   Apply ice to the affected area for 15-20 minutes each hour while awake for 2 days. Put the ice in a plastic bag and place a towel between the bag of ice and your skin.  Rest the injured joint as much as possible, but continue to put the joint through a full range of motion, 4 times per day. (The shoulder joint especially becomes rapidly "frozen" if not used.) When the pain lessens, begin normal slow movements and usual activities.  Only take over-the-counter or prescription medicines for pain, discomfort or fever as directed by your caregiver.  Your caregiver may recommend draining the bursa and injecting medicine into the bursa. This may help the healing process.  Follow all instructions for follow-up with your caregiver. This includes any orthopedic referrals, physical therapy and rehabilitation. Any delay in obtaining necessary care could result in a delay or failure of the bursitis to heal and chronic pain. SEEK IMMEDIATE MEDICAL CARE IF:   Your pain increases even during treatment.  You develop an oral temperature above 102 F (38.9 C) and have heat and inflammation over the involved bursa. MAKE SURE YOU:   Understand these instructions.  Will watch your condition.  Will get help right away if you are not doing well or get worse. Document Released: 10/25/2000 Document Revised: 01/20/2012 Document Reviewed: 09/29/2009 [employer] Patient Information 2014 [employer].  

## 2014-04-22 NOTE — Progress Notes (Signed)
   Subjective:    Patient ID: [name], female    DOB: 02-21-1941, 73 y.o.   MRN: [id number]  HPI Patient is a 73 year old female who presents to the clinic with left shoulder pain and occasional left elbow pain. She reports the symptoms started 4 weeks ago. She has been using hot and cold compresses. She feels that the hot does better than a cold. She denies any known injury or trauma. The pain first started in the left shoulder and has moved down into the left elbow. Range of the elbow has not been effective. She has significant range of motion loss at her left shoulder. She tried aspirin and ibuprofen does help for couple days then did not seem to help as much. It does help to subside the pain. Any movement causes breath taking pain at points. It is making it very hard to work.    Review of Systems     Objective:   Physical Exam  Constitutional: She is oriented to person, place, and time.  Musculoskeletal:  Left shoulder- LROM abduction to 90 degrees with pain.  Strength 5/5.  Pain with palpation over left shoulder anterior and posteriorly. Pain over coracoid process.  No pain over clavice or acromion.  Negative drop arm.  Negative empty can.  Positive hawkins.     Left elbow Negative for tenderness to palpation. NROM.  Neurological: She is alert and oriented to person, place, and time.  Psychiatric: She has a normal mood and affect. Her behavior is normal.          Assessment & Plan:  Left shoulder pain/left elbow pain-patient did not want to get an x-ray today. I do feel like it is shoulder bursitis. I told her we could try treatment for a week and if not improving we need to get x-rays. Strength too goo for rotator cuff tear could be labrum tear but no hx of injury. Shoulder bursitis injection was given today. Mobic was sent pharmacy to use once daily for 2 weeks then as needed for pain. Patient was instructed not to use with any other NSAID products. Exercises were  given for patient to be tried as tolerated. Patient encouraged to ice the shoulder regularly. Followup if not improving or if worsening. Concerned about adhesive capsulitis setting in. Please follow up if not improving.   Subacromial Shoulder Injection Procedure Note  Pre-operative Diagnosis: left shoulder bursitis  Post-operative Diagnosis: same  Indications: pain  Anesthesia: ethyl chloride  Procedure Details   Verbal consent was obtained for the procedure. The shoulder was prepped with Betadine and the skin was anesthetized. A 27 gauge needle was advanced into the subacromial space through posterior approach without difficulty  The space was then injected with 9 ml 1% lidocaine and 1 ml of depo medrol 40mg . The injection site was cleansed with isopropyl alcohol and a dressing was applied.  Complications:  None; patient tolerated the procedure well.

## 2014-07-19 ENCOUNTER — Ambulatory Visit: Payer: Medicare PPO | Admitting: Physician Assistant

## 2014-07-22 ENCOUNTER — Ambulatory Visit (INDEPENDENT_AMBULATORY_CARE_PROVIDER_SITE_OTHER): Payer: Medicare PPO | Admitting: Physician Assistant

## 2014-07-22 ENCOUNTER — Encounter: Payer: Self-pay | Admitting: Physician Assistant

## 2014-07-22 VITALS — BP 146/74 | HR 68 | Ht 63.0 in | Wt 159.0 lb

## 2014-07-22 DIAGNOSIS — K429 Umbilical hernia without obstruction or gangrene: Secondary | ICD-10-CM | POA: Insufficient documentation

## 2014-07-22 DIAGNOSIS — Z0000 Encounter for general adult medical examination without abnormal findings: Secondary | ICD-10-CM

## 2014-07-22 DIAGNOSIS — I10 Essential (primary) hypertension: Secondary | ICD-10-CM

## 2014-07-22 DIAGNOSIS — R7301 Impaired fasting glucose: Secondary | ICD-10-CM

## 2014-07-22 LAB — POCT UA - MICROALBUMIN
Creatinine, POC: 50 mg/dL
Microalbumin Ur, POC: 10 mg/L

## 2014-07-22 LAB — POCT GLYCOSYLATED HEMOGLOBIN (HGB A1C): Hemoglobin A1C: 6.2

## 2014-07-22 MED ORDER — LISINOPRIL-HYDROCHLOROTHIAZIDE 20-25 MG PO TABS: ORAL_TABLET | ORAL | Status: DC

## 2014-07-22 MED ORDER — METFORMIN HCL 500 MG PO TABS: ORAL_TABLET | ORAL | Status: DC

## 2014-07-22 MED ORDER — GLUCOSE BLOOD VI STRP: ORAL_STRIP | Status: DC

## 2014-07-22 NOTE — Progress Notes (Signed)
Subjective:    Lindsey Fischer is a 73 y.o. female who presents for Medicare Annual/Subsequent preventive examination.  Preventive Screening-Counseling & Management  Tobacco History  Smoking status  . Former Smoker  . Quit date: 11/11/1980  Smokeless tobacco  . Never Used     Problems Prior to Visit 1.   Current Problems (verified) Patient Active Problem List   Diagnosis Date Noted  . DM (diabetes mellitus) 11/15/2012  . Metabolic syndrome X 76/16/0737  . GRIEF REACTION, ACUTE 03/27/2010  . ESSENTIAL HYPERTENSION, BENIGN 03/27/2010  . IMPAIRED FASTING GLUCOSE 03/27/2010  . DYSLIPIDEMIA 01/19/2010  . ASTHMA, UNSPECIFIED, UNSPECIFIED STATUS 01/19/2010    Medications Prior to Visit Current Outpatient Prescriptions on File Prior to Visit  Medication Sig Dispense Refill  . AMBULATORY NON FORMULARY MEDICATION Nebulizer machine and tubing  Dx:  COPD exacerbation  1 Units  0  . AMBULATORY NON FORMULARY MEDICATION zostavax Im injection once.  1 application  0  . ferrous sulfate 325 (65 FE) MG tablet Take 325 mg by mouth daily with breakfast.      . Fluticasone-Salmeterol (ADVAIR DISKUS) 250-50 MCG/DOSE AEPB Inhale 1 puff into the lungs every 12 (twelve) hours.  180 each  3  . ipratropium-albuterol (DUONEB) 0.5-2.5 (3) MG/3ML SOLN Take 3 mLs by nebulization every 6 (six) hours as needed.  360 mL  0  . lisinopril-hydrochlorothiazide (PRINZIDE,ZESTORETIC) 20-25 MG per tablet take 1 tablet by mouth daily  30 tablet  6  . magnesium 30 MG tablet Take 30 mg by mouth daily.      . meloxicam (MOBIC) 15 MG tablet Take one tablet daily for 2 weeks then as needed.  30 tablet  0  . metFORMIN (GLUCOPHAGE) 500 MG tablet take 1 tablet by mouth once daily  30 tablet  6  . ONE TOUCH ULTRA TEST test strip USE AS DIRECTED TO CHECK BLOOD SUGAR TWICE A DAY.  100 each  4  . PROAIR HFA 108 (90 BASE) MCG/ACT inhaler inhale 2 puffs INTO THE LUNGS daily  8.5 g  6  . simvastatin (ZOCOR) 20 MG tablet Take 1  tablet (20 mg total) by mouth every evening.  30 tablet  11   No current facility-administered medications on file prior to visit.    Current Medications (verified) Current Outpatient Prescriptions  Medication Sig Dispense Refill  . AMBULATORY NON FORMULARY MEDICATION Nebulizer machine and tubing  Dx:  COPD exacerbation  1 Units  0  . AMBULATORY NON FORMULARY MEDICATION zostavax Im injection once.  1 application  0  . AMBULATORY NON FORMULARY MEDICATION 2 (two) times daily. Osteobiflex      . ferrous sulfate 325 (65 FE) MG tablet Take 325 mg by mouth daily with breakfast.      . Fluticasone-Salmeterol (ADVAIR DISKUS) 250-50 MCG/DOSE AEPB Inhale 1 puff into the lungs every 12 (twelve) hours.  180 each  3  . ipratropium-albuterol (DUONEB) 0.5-2.5 (3) MG/3ML SOLN Take 3 mLs by nebulization every 6 (six) hours as needed.  360 mL  0  . lisinopril-hydrochlorothiazide (PRINZIDE,ZESTORETIC) 20-25 MG per tablet take 1 tablet by mouth daily  30 tablet  6  . magnesium 30 MG tablet Take 30 mg by mouth daily.      . meloxicam (MOBIC) 15 MG tablet Take one tablet daily for 2 weeks then as needed.  30 tablet  0  . metFORMIN (GLUCOPHAGE) 500 MG tablet take 1 tablet by mouth once daily  30 tablet  6  . ONE TOUCH ULTRA  TEST test strip USE AS DIRECTED TO CHECK BLOOD SUGAR TWICE A DAY.  100 each  4  . PROAIR HFA 108 (90 BASE) MCG/ACT inhaler inhale 2 puffs INTO THE LUNGS daily  8.5 g  6  . simvastatin (ZOCOR) 20 MG tablet Take 1 tablet (20 mg total) by mouth every evening.  30 tablet  11   No current facility-administered medications for this visit.     Allergies (verified) Livalo; Mucinex; Peanut-containing drug products; Pravastatin; Prednisone; and Zocor   PAST HISTORY  Family History Family History  Problem Relation Age of Onset  . Diabetes Mother   . Heart attack Father 59    deceased  . Cancer Sister     brain tumor  . Cancer Brother     prostrate and lung CA  . COPD Sister     Social  History History  Substance Use Topics  . Smoking status: Former Smoker    Quit date: 11/11/1980  . Smokeless tobacco: Never Used  . Alcohol Use: No     Are there smokers in your home (other than you)? No  Risk Factors Current exercise habits: walks at her job but no other formal exercise.   Dietary issues discussed: pt eating well and feeling fine.    Cardiac risk factors: advanced age (older than 87 for men, 51 for women) and dyslipidemia.  Depression Screen (Note: if answer to either of the following is "Yes", a more complete depression screening is indicated)   Over the past two weeks, have you felt down, depressed or hopeless? No  Over the past two weeks, have you felt little interest or pleasure in doing things? No  Have you lost interest or pleasure in daily life? No  Do you often feel hopeless? No  Do you cry easily over simple problems? No  Activities of Daily Living In your present state of health, do you have any difficulty performing the following activities?:  Driving? No Managing money?  No Feeding yourself? No Getting from bed to chair? No Climbing a flight of stairs? No Preparing food and eating?: No Bathing or showering? No Getting dressed: No Getting to the toilet? No Using the toilet:No Moving around from place to place: No In the past year have you fallen or had a near fall?:No   Are you sexually active?  No  Do you have more than one partner?  No  Hearing Difficulties: No Do you often ask people to speak up or repeat themselves? No Do you experience ringing or noises in your ears? No Do you have difficulty understanding soft or whispered voices? No   Do you feel that you have a problem with memory? No  Do you often misplace items? No  Do you feel safe at home?  Yes  Cognitive Testing  Alert? Yes  Normal Appearance?Yes  Oriented to person? Yes  Place? Yes   Time? Yes  Recall of three objects?  Yes  Can perform simple calculations?  Yes  Displays appropriate judgment?Yes  Can read the correct time from a watch face?Yes   Advanced Directives have been discussed with the patient? Yes  List the Names of Other Physician/Practitioners you currently use: 1.    Indicate any recent Medical Services you may have received from other than [facility] providers in the past year (date may be approximate).  Immunization History  Administered Date(s) Administered  . Influenza Whole 09/11/2010  . Influenza,inj,Quad PF,36+ Mos 07/16/2013  . Pneumococcal Polysaccharide-23 09/11/2010  . Tdap 03/05/2012  Screening Tests Health Maintenance  Topic Date Due  . Zostavax  08/14/2001  . Ophthalmology Exam  04/24/2014  . Urine Microalbumin  05/17/2014  . Foot Exam  07/16/2014  . Hemoglobin A1c  07/17/2014  . Influenza Vaccine  07/23/2015 (Originally 06/11/2014)  . Mammogram  09/22/2015  . Colonoscopy  06/11/2017  . Tetanus/tdap  03/05/2022  . Pneumococcal Polysaccharide Vaccine Age 58 And Over  Completed    All answers were reviewed with the patient and necessary referrals were made:  Iran Planas, PA-C   07/22/2014   History reviewed: allergies, current medications, past family history, past medical history, past social history, past surgical history and problem list  Review of Systems A comprehensive review of systems was negative.    Objective:    vision R- 20/40, L 20/15, B 20/25  Body mass index is 28.17 kg/(m^2). BP 146/74  Pulse 68  Ht 5\' 3"  (1.6 m)  Wt 159 lb (72.122 kg)  BMI 28.17 kg/m2  BP 146/74  Pulse 68  Ht 5\' 3"  (1.6 m)  Wt 159 lb (72.122 kg)  BMI 28.17 kg/m2  General Appearance:    Alert, cooperative, no distress, appears stated age  Head:    Normocephalic, without obvious abnormality, atraumatic  Eyes:    PERRL, conjunctiva/corneas clear, EOM's intact, fundi    benign, both eyes  Ears:    Normal TM's and external ear canals, both ears  Nose:   Nares normal, septum midline, mucosa normal, no  drainage    or sinus tenderness  Throat:   Lips, mucosa, and tongue normal; teeth and gums normal  Neck:   Supple, symmetrical, trachea midline, no adenopathy;    thyroid:  no enlargement/tenderness/nodules; no carotid   bruit or JVD  Back:     Symmetric, no curvature, ROM normal, no CVA tenderness  Lungs:     Clear to auscultation bilaterally, respirations unlabored  Chest Wall:    No tenderness or deformity   Heart:    Regular rate and rhythm, S1 and S2 normal, no murmur, rub   or gallop  Breast Exam:    No tenderness, masses, or nipple abnormality  Abdomen:     Soft, non-tender, bowel sounds active all four quadrants,  6cm by 4 3/7TK by 2 cm umbilical hernia, no organomegaly  Genitalia:  Not done.   Rectal:  Not done.   Extremities:   Extremities normal, atraumatic, no cyanosis or edema  Pulses:   2+ and symmetric all extremities  Skin:   Skin color, texture, turgor normal, no rashes or lesions  Lymph nodes:   Cervical, supraclavicular, and axillary nodes normal  Neurologic:   CNII-XII intact, normal strength, sensation and reflexes    throughout       Assessment:          Plan:     During the course of the visit the patient was educated and counseled about appropriate screening and preventive services including:   Pt up to date on mammogram and colonoscopy.   Pt declines shingles vaccine today.   She will get flu shot free at work.   Ordered fasting labs.   Impaired fasting glucose- .Marland Kitchen Lab Results  Component Value Date   HGBA1C 6.2 07/22/2014   Doing great. Continue on metformin, low carb/low sugar diet and exercise. Follow up in 1 year.   HTN- controlled refilled medication for 6 months. Did not take medication today due to fasting.   Umbilical hernia- pt does not want to do anything  about it. Discussed at least seeing a Psychologist, sport and exercise. Will refer. Discussed signs of incarceration and medical emergency. 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    Diet review for nutrition referral?  Not Indicated   Patient Instructions (the written plan) was given to the patient.  Medicare Attestati I have personally reviewed: The patient's medical and social history Their use of alcohol, tobacco or illicit drugs Their current medications and supplements The patient's functional ability including ADLs,fall risks, home safety risks, cognitive, and hearing and visual impairment Diet and physical activities Evidence for depression or mood disorders  The patient's weight, height, BMI, and visual acuity have been recorded in the chart.  I have made referrals, counseling, and provided education to the patient based on review of the above and I have provided the patient with a written personalized care plan for preventive services.     Iran Planas, PA-C   07/22/2014

## 2014-07-22 NOTE — Patient Instructions (Signed)

## 2014-09-01 ENCOUNTER — Other Ambulatory Visit: Payer: Self-pay | Admitting: Physician Assistant

## 2014-09-14 ENCOUNTER — Other Ambulatory Visit: Payer: Self-pay | Admitting: Physician Assistant

## 2014-09-14 DIAGNOSIS — Z9289 Personal history of other medical treatment: Secondary | ICD-10-CM

## 2014-09-29 ENCOUNTER — Ambulatory Visit (INDEPENDENT_AMBULATORY_CARE_PROVIDER_SITE_OTHER): Payer: Medicare PPO

## 2014-09-29 DIAGNOSIS — Z1231 Encounter for screening mammogram for malignant neoplasm of breast: Secondary | ICD-10-CM

## 2014-09-29 DIAGNOSIS — Z9289 Personal history of other medical treatment: Secondary | ICD-10-CM

## 2015-01-20 ENCOUNTER — Ambulatory Visit (INDEPENDENT_AMBULATORY_CARE_PROVIDER_SITE_OTHER): Payer: Medicare PPO | Admitting: Physician Assistant

## 2015-01-20 ENCOUNTER — Encounter: Payer: Self-pay | Admitting: Physician Assistant

## 2015-01-20 VITALS — BP 116/68 | HR 78 | Ht 63.0 in | Wt 160.0 lb

## 2015-01-20 DIAGNOSIS — I10 Essential (primary) hypertension: Secondary | ICD-10-CM

## 2015-01-20 DIAGNOSIS — J45909 Unspecified asthma, uncomplicated: Secondary | ICD-10-CM | POA: Diagnosis not present

## 2015-01-20 DIAGNOSIS — Z23 Encounter for immunization: Secondary | ICD-10-CM | POA: Diagnosis not present

## 2015-01-20 DIAGNOSIS — E118 Type 2 diabetes mellitus with unspecified complications: Secondary | ICD-10-CM | POA: Diagnosis not present

## 2015-01-20 LAB — POCT GLYCOSYLATED HEMOGLOBIN (HGB A1C): Hemoglobin A1C: 6.4

## 2015-01-20 MED ORDER — METFORMIN HCL 500 MG PO TABS: ORAL_TABLET | ORAL | Status: DC

## 2015-01-20 MED ORDER — LISINOPRIL-HYDROCHLOROTHIAZIDE 20-25 MG PO TABS: ORAL_TABLET | ORAL | Status: DC

## 2015-01-20 MED ORDER — MELOXICAM 15 MG PO TABS: ORAL_TABLET | ORAL | Status: DC

## 2015-01-20 NOTE — Progress Notes (Signed)
   Subjective:    Patient ID: Lindsey Fischer, female    DOB: 31-Dec-1940, 74 y.o.   MRN: [id number]  HPI Pt presents to the clinic for 6 month follow up.   DM- she does really check sugars. She works and tries to walk a few miles each day. Denies any hypoglycemia events. Feeling good no complaints. Not been to eye doctor yet. Never got shingles vaccines that was given.   HtN- denies any problems or concerns. No CP, palpitations, vision changes or HA's. Takes medication daily.   Asthma- controlled with advair. Albuterol as needed. Rarely using albuterol.    Review of Systems  All other systems reviewed and are negative.      Objective:   Physical Exam  Constitutional: She is oriented to person, place, and time. She appears well-developed and well-nourished.  HENT:  Head: Normocephalic and atraumatic.  Cardiovascular: Normal rate, regular rhythm and normal heart sounds.   Pulmonary/Chest: Effort normal and breath sounds normal.  Neurological: She is alert and oriented to person, place, and time.  Skin: Skin is dry.  Psychiatric: She has a normal mood and affect. Her behavior is normal.          Assessment & Plan:  DM, type 2, controlled- .Marland Kitchen Lab Results  Component Value Date   HGBA1C 6.4 01/20/2015   Controlled today.  Continue on metformin 500mg  once daily.  Foot exam normal.  Continue healthy diet and exercise plan.  prevnar 13 given today. Reminded to get eye exam.  Follow up in 6 months.   HtN- doing great. Refilled for 6 months.   Asthma- controlled. Refilled advair. Albuterol has needed.

## 2015-02-20 ENCOUNTER — Other Ambulatory Visit: Payer: Self-pay | Admitting: Physician Assistant

## 2015-07-24 ENCOUNTER — Encounter: Payer: Self-pay | Admitting: Physician Assistant

## 2015-07-24 ENCOUNTER — Ambulatory Visit (INDEPENDENT_AMBULATORY_CARE_PROVIDER_SITE_OTHER): Payer: Medicare PPO | Admitting: Physician Assistant

## 2015-07-24 VITALS — BP 156/65 | HR 68 | Ht 63.0 in | Wt 154.0 lb

## 2015-07-24 DIAGNOSIS — Z1239 Encounter for other screening for malignant neoplasm of breast: Secondary | ICD-10-CM

## 2015-07-24 DIAGNOSIS — Z0000 Encounter for general adult medical examination without abnormal findings: Secondary | ICD-10-CM

## 2015-07-24 DIAGNOSIS — E785 Hyperlipidemia, unspecified: Secondary | ICD-10-CM

## 2015-07-24 DIAGNOSIS — Z23 Encounter for immunization: Secondary | ICD-10-CM

## 2015-07-24 DIAGNOSIS — Z13820 Encounter for screening for osteoporosis: Secondary | ICD-10-CM

## 2015-07-24 DIAGNOSIS — E119 Type 2 diabetes mellitus without complications: Secondary | ICD-10-CM

## 2015-07-24 DIAGNOSIS — I10 Essential (primary) hypertension: Secondary | ICD-10-CM

## 2015-07-24 NOTE — Patient Instructions (Signed)

## 2015-07-24 NOTE — Progress Notes (Addendum)
Subjective:    Lindsey Fischer is a 74 y.o. female who presents for Medicare Annual/Subsequent preventive examination.  Preventive Screening-Counseling & Management  Tobacco History  Smoking status  . Former Smoker  . Quit date: 11/11/1980  Smokeless tobacco  . Never Used     Problems Prior to Visit 1.  None  Current Problems (verified) Patient Active Problem List   Diagnosis Date Noted  . Umbilical hernia 56/38/9373  . DM (diabetes mellitus) 11/15/2012  . Metabolic syndrome X 42/87/6811  . GRIEF REACTION, ACUTE 03/27/2010  . ESSENTIAL HYPERTENSION, BENIGN 03/27/2010  . IMPAIRED FASTING GLUCOSE 03/27/2010  . DYSLIPIDEMIA 01/19/2010  . ASTHMA, UNSPECIFIED, UNSPECIFIED STATUS 01/19/2010    Medications Prior to Visit Current Outpatient Prescriptions on File Prior to Visit  Medication Sig Dispense Refill  . ADVAIR DISKUS 250-50 MCG/DOSE AEPB inhale 1 puff INTO THE LUNGS every 12 hours 180 each 3  . AMBULATORY NON FORMULARY MEDICATION Nebulizer machine and tubing  Dx:  COPD exacerbation 1 Units 0  . AMBULATORY NON FORMULARY MEDICATION zostavax Im injection once. 1 application 0  . AMBULATORY NON FORMULARY MEDICATION 2 (two) times daily. Osteobiflex    . ferrous sulfate 325 (65 FE) MG tablet Take 325 mg by mouth daily with breakfast.    . glucose blood (ONE TOUCH ULTRA TEST) test strip USE AS DIRECTED TO CHECK BLOOD SUGAR TWICE A DAY.  DX impaired fasting glucose 100 each 4  . ipratropium-albuterol (DUONEB) 0.5-2.5 (3) MG/3ML SOLN Take 3 mLs by nebulization every 6 (six) hours as needed. 360 mL 0  . lisinopril-hydrochlorothiazide (PRINZIDE,ZESTORETIC) 20-25 MG per tablet take 1 tablet by mouth daily 30 tablet 6  . magnesium 30 MG tablet Take 30 mg by mouth daily.    . meloxicam (MOBIC) 15 MG tablet Take one tablet daily for 2 weeks then as needed. 30 tablet 1  . metFORMIN (GLUCOPHAGE) 500 MG tablet take 1 tablet by mouth once daily 30 tablet 6  . PROAIR HFA 108 (90 BASE)  MCG/ACT inhaler inhale 2 puffs by mouth daily 8.5 g 6  . simvastatin (ZOCOR) 20 MG tablet take 1 tablet by mouth every evening 30 tablet 11   No current facility-administered medications on file prior to visit.    Current Medications (verified) Current Outpatient Prescriptions  Medication Sig Dispense Refill  . ADVAIR DISKUS 250-50 MCG/DOSE AEPB inhale 1 puff INTO THE LUNGS every 12 hours 180 each 3  . AMBULATORY NON FORMULARY MEDICATION Nebulizer machine and tubing  Dx:  COPD exacerbation 1 Units 0  . AMBULATORY NON FORMULARY MEDICATION zostavax Im injection once. 1 application 0  . AMBULATORY NON FORMULARY MEDICATION 2 (two) times daily. Osteobiflex    . ferrous sulfate 325 (65 FE) MG tablet Take 325 mg by mouth daily with breakfast.    . glucose blood (ONE TOUCH ULTRA TEST) test strip USE AS DIRECTED TO CHECK BLOOD SUGAR TWICE A DAY.  DX impaired fasting glucose 100 each 4  . ipratropium-albuterol (DUONEB) 0.5-2.5 (3) MG/3ML SOLN Take 3 mLs by nebulization every 6 (six) hours as needed. 360 mL 0  . lisinopril-hydrochlorothiazide (PRINZIDE,ZESTORETIC) 20-25 MG per tablet take 1 tablet by mouth daily 30 tablet 6  . magnesium 30 MG tablet Take 30 mg by mouth daily.    . meloxicam (MOBIC) 15 MG tablet Take one tablet daily for 2 weeks then as needed. 30 tablet 1  . metFORMIN (GLUCOPHAGE) 500 MG tablet take 1 tablet by mouth once daily 30 tablet 6  . PROAIR  HFA 108 (90 BASE) MCG/ACT inhaler inhale 2 puffs by mouth daily 8.5 g 6  . simvastatin (ZOCOR) 20 MG tablet take 1 tablet by mouth every evening 30 tablet 11   No current facility-administered medications for this visit.     Allergies (verified) Livalo; Mucinex; Peanut-containing drug products; Pravastatin; Prednisone; and Zocor   PAST HISTORY  Family History Family History  Problem Relation Age of Onset  . Diabetes Mother   . Heart attack Father 57    deceased  . Cancer Sister     brain tumor  . Cancer Brother      prostrate and lung CA  . COPD Sister     Social History Social History  Substance Use Topics  . Smoking status: Former Smoker    Quit date: 11/11/1980  . Smokeless tobacco: Never Used  . Alcohol Use: No     Are there smokers in your home (other than you)? Nono  Risk Factors Current exercise habits: The patient has a physically strenuous job, but has no regular exercise apart from work.  Mowing the yard and walking Dietary issues discussed: **Patient feels she maintains a healthy diet   Cardiac risk factors: advanced age (older than 80 for men, 43 for women).  Depression Screen (Note: if answer to either of the following is "Yes", a more complete depression screening is indicated)   Over the past two weeks, have you felt down, depressed or hopeless? No  Over the past two weeks, have you felt little interest or pleasure in doing things? No  Have you lost interest or pleasure in daily life? No  Do you often feel hopeless? No  Do you cry easily over simple problems? No  Activities of Daily Living In your present state of health, do you have any difficulty performing the following activities?:  Driving? Yes Managing money?  Yes Feeding yourself? Yes Getting from bed to chair? YesBP 156/65 mmHg  Pulse 68  Ht 5\' 3"  (1.6 m)  Wt 154 lb (69.854 kg)  BMI 27.29 kg/m2                                                               Climbing a flight of stairs? Yes Preparing food and eating?: Yes Bathing or showering? Yes Getting dressed: Yes Getting to the toilet? Yes Using the toilet:Yes Moving around from place to place: Yes In the past year have you fallen or had a near fall?:No   Are you sexually active?  No  Do you have more than one partner?  No  Hearing Difficulties: No Do you often ask people to speak up or repeat themselves? No Do you experience ringing or noises in your ears? No Do you have difficulty understanding soft or whispered voices?  No   Do you feel that you have a problem with memory? No  Do you often misplace items? No  Do you feel safe at home?  Yes  Cognitive Testing  Alert? Yes  Normal Appearance?Yes  Oriented to person? Yes  Place? Yes   Time? Yes  Recall of three objects?  Yes  Can perform simple calculations? Yes yes  Displays appropriate judgment?Yes  Can read the correct time from a watch face?Yes   Advanced Directives have been discussed with the patient?  No  List the Names of Other Physician/Practitioners you currently use: 1.    Indicate any recent Medical Services you may have received from other than [facility] providers in the past year (date may be approximate).  Immunization History  Administered Date(s) Administered  . Influenza Whole 09/11/2010  . Influenza,inj,Quad PF,36+ Mos 07/16/2013, 07/24/2015  . Influenza-Unspecified 07/31/2012, 08/04/2014  . Pneumococcal Conjugate-13 01/20/2015  . Pneumococcal Polysaccharide-23 09/11/2010  . Tdap 03/05/2012    Screening Tests Health Maintenance  Topic Date Due  . OPHTHALMOLOGY EXAM  07/20/2014  . HEMOGLOBIN A1C  07/23/2015  . URINE MICROALBUMIN  07/23/2015  . ZOSTAVAX  07/22/2024 (Originally 08/14/2001)  . FOOT EXAM  01/20/2016  . INFLUENZA VACCINE  06/11/2016  . MAMMOGRAM  09/29/2016  . COLONOSCOPY  06/11/2017  . TETANUS/TDAP  03/05/2022  . DEXA SCAN  Completed  . PNA vac Low Risk Adult  Completed    All answers were reviewed with the patient and necessary referrals were made:  Iran Planas, PA-C   07/24/2015   History reviewed: She  has a past medical history of Asthma; Hypertension; Hyperlipidemia; and Diabetes mellitus.  Review of Systems Pertinent items are noted in HPI.    Objective:     Vision by Snellen chart: right eye:20/20, left eye:20/20  Body mass index is 27.29 kg/(m^2). BP 156/65 mmHg  Pulse 68  Ht 5\' 3"  (1.6 m)  Wt 154 lb (69.854 kg)  BMI 27.29 kg/m2  BP 156/65 mmHg  Pulse 68  Ht 5\' 3"  (1.6 m)  Wt 154  lb (69.854 kg)  BMI 27.29 kg/m2  General Appearance:    Alert, cooperative, no distress, appears stated age  Head:    Normocephalic, without obvious abnormality, atraumatic  Eyes:    PERRL, conjunctiva/corneas clear, EOM's intact, fundi    benign, both eyes  Ears:    Normal TM's and external ear canals, both ears  Nose:   Nares normal, septum midline, mucosa normal, no drainage    or sinus tenderness  Throat:   Lips, mucosa, and tongue normal; teeth and gums normal  Neck:   Supple, symmetrical, trachea midline, no adenopathy;    thyroid:  no enlargement/tenderness/nodules; no carotid   bruit or JVD  Back:     Symmetric, no curvature, ROM normal, no CVA tenderness  Lungs:     Clear to auscultation bilaterally, respirations unlabored  Chest Wall:    No tenderness or deformity   Heart:    Regular rate and rhythm, S1 and S2 normal, no murmur, rub   or gallop  Breast Exam:    No tenderness, masses, or nipple abnormality  Abdomen:     Soft, non-tender, bowel sounds active all four quadrants,    no masses, no organomegaly  Genitalia:  Not done.   Rectal:  Not done.   Extremities:   Extremities normal, atraumatic, no cyanosis or edema  Pulses:   2+ and symmetric all extremities  Skin:   Skin color, texture, turgor normal, no rashes or lesions  Lymph nodes:   Cervical, supraclavicular, and axillary nodes normal  Neurologic:   CNII-XII intact, normal strength, sensation and reflexes    throughout       Assessment:           Plan:     During the course of the visit the patient was educated and counseled about appropriate screening and preventive services including:    Influenza vaccine   Dexa scan- will order last exam 2013.   Mammogram- not  due until November 2016.   Hemoccult cards given for annual screening. Last colonoscopy 2008.   Discussed needs to bring in amount of calcium and vitamin D taking. HO given with recommendation. 6CIT- 0/28   Hyperlipidemia- will recheck  lipid.   HTN- did not take BP medications today. Hx of controlled. No refills needed today.   DM- hx of controlled will check A!C. Continue metformin. Recheck in 3 months. Pt aware needs eye exam.   Asthma- controlled with advair and occasional proair use.    Diet review for nutrition referral? Not Indicated ____   Patient Instructions (the written plan) was given to the patient.  Medicare Attestation I have personally reviewed: The patient's medical and social history Their use of alcohol, tobacco or illicit drugs Their current medications and supplements The patient's functional ability including ADLs,fall risks, home safety risks, cognitive, and hearing and visual impairment Diet and physical activities Evidence for depression or mood disorders  The patient's weight, height, BMI, and visual acuity have been recorded in the chart.  I have made referrals, counseling, and provided education to the patient based on review of the above and I have provided the patient with a written personalized care plan for preventive services.     Iran Planas, PA-C   07/24/2015

## 2015-07-24 NOTE — Addendum Note (Signed)
Addended by: [name] on: 07/24/2015 11:27 AM   Modules accepted: Orders

## 2015-07-25 LAB — LIPID PANEL
CHOL/HDL RATIO: 6.6 ratio — AB (ref <=5.0)
Cholesterol: 277 mg/dL — ABNORMAL HIGH (ref 125–200)
HDL: 42 mg/dL — ABNORMAL LOW (ref >=46)
LDL CALC: 167 mg/dL — AB (ref <130)
Triglycerides: 342 mg/dL — ABNORMAL HIGH (ref <150)
VLDL: 68 mg/dL — ABNORMAL HIGH (ref <30)

## 2015-07-25 LAB — COMPLETE METABOLIC PANEL WITH GFR
ALT: 12 U/L (ref 6–29)
AST: 19 U/L (ref 10–35)
Albumin: 3.9 g/dL (ref 3.6–5.1)
Alkaline Phosphatase: 45 U/L (ref 33–130)
BUN: 21 mg/dL (ref 7–25)
CO2: 26 mmol/L (ref 20–31)
CREATININE: 0.92 mg/dL (ref 0.60–0.93)
Calcium: 9.6 mg/dL (ref 8.6–10.4)
Chloride: 102 mmol/L (ref 98–110)
GFR, Est African American: 71 mL/min (ref >=60)
GFR, Est Non African American: 62 mL/min (ref >=60)
Glucose, Bld: 102 mg/dL — ABNORMAL HIGH (ref 65–99)
POTASSIUM: 3.8 mmol/L (ref 3.5–5.3)
Sodium: 139 mmol/L (ref 135–146)
Total Bilirubin: 0.3 mg/dL (ref 0.2–1.2)
Total Protein: 6.6 g/dL (ref 6.1–8.1)

## 2015-07-25 LAB — HEMOGLOBIN A1C
Hgb A1c MFr Bld: 6.3 % — ABNORMAL HIGH (ref <5.7)
MEAN PLASMA GLUCOSE: 134 mg/dL — AB (ref <117)

## 2015-07-28 ENCOUNTER — Other Ambulatory Visit: Payer: Self-pay | Admitting: *Deleted

## 2015-07-28 MED ORDER — EZETIMIBE 10 MG PO TABS: 10.0000 mg | ORAL_TABLET | Freq: Every day | ORAL | Status: DC

## 2015-07-31 ENCOUNTER — Other Ambulatory Visit: Payer: Self-pay

## 2015-07-31 ENCOUNTER — Other Ambulatory Visit: Payer: Self-pay | Admitting: Physician Assistant

## 2015-07-31 DIAGNOSIS — Z0000 Encounter for general adult medical examination without abnormal findings: Secondary | ICD-10-CM

## 2015-07-31 LAB — POC HEMOCCULT BLD/STL (HOME/3-CARD/SCREEN)
Card #2 Fecal Occult Blod, POC: NEGATIVE
Card #3 Fecal Occult Blood, POC: NEGATIVE
FECAL OCCULT BLD: NEGATIVE

## 2015-10-11 ENCOUNTER — Ambulatory Visit: Payer: Medicare PPO

## 2015-10-11 ENCOUNTER — Other Ambulatory Visit: Payer: Medicare PPO

## 2015-10-18 ENCOUNTER — Other Ambulatory Visit: Payer: Medicare PPO

## 2015-10-18 ENCOUNTER — Ambulatory Visit: Payer: Medicare PPO

## 2015-10-23 ENCOUNTER — Encounter: Payer: Self-pay | Admitting: Physician Assistant

## 2015-10-23 ENCOUNTER — Ambulatory Visit (INDEPENDENT_AMBULATORY_CARE_PROVIDER_SITE_OTHER): Payer: Medicare PPO | Admitting: Physician Assistant

## 2015-10-23 VITALS — BP 140/68 | HR 83 | Ht 63.0 in | Wt 154.0 lb

## 2015-10-23 DIAGNOSIS — I10 Essential (primary) hypertension: Secondary | ICD-10-CM

## 2015-10-23 DIAGNOSIS — B079 Viral wart, unspecified: Secondary | ICD-10-CM

## 2015-10-23 DIAGNOSIS — J4520 Mild intermittent asthma, uncomplicated: Secondary | ICD-10-CM

## 2015-10-23 DIAGNOSIS — E118 Type 2 diabetes mellitus with unspecified complications: Secondary | ICD-10-CM | POA: Diagnosis not present

## 2015-10-23 DIAGNOSIS — E785 Hyperlipidemia, unspecified: Secondary | ICD-10-CM | POA: Diagnosis not present

## 2015-10-23 LAB — POCT UA - MICROALBUMIN
Creatinine, POC: 100 mg/dL
MICROALBUMIN (UR) POC: 30 mg/L

## 2015-10-23 LAB — POCT GLYCOSYLATED HEMOGLOBIN (HGB A1C): Hemoglobin A1C: 6.1

## 2015-10-23 MED ORDER — FLUTICASONE-SALMETEROL 250-50 MCG/DOSE IN AEPB: INHALATION_SPRAY | RESPIRATORY_TRACT | Status: DC

## 2015-10-23 MED ORDER — PRAVASTATIN SODIUM 40 MG PO TABS: 40.0000 mg | ORAL_TABLET | Freq: Every day | ORAL | Status: DC

## 2015-10-23 MED ORDER — LISINOPRIL-HYDROCHLOROTHIAZIDE 20-25 MG PO TABS: ORAL_TABLET | ORAL | Status: DC

## 2015-10-23 MED ORDER — METFORMIN HCL 500 MG PO TABS: ORAL_TABLET | ORAL | Status: DC

## 2015-10-23 NOTE — Progress Notes (Signed)
Subjective:    Patient ID: Lindsey Fischer, female    DOB: 1940-12-16, 74 y.o.   MRN: [id number]  HPI Patient is a 74 year old female who presents to the clinic for medication follow-up.  Diabetes-patient is doing well. She is watching what she eats and walking daily. She continues to work daily and she gets a lot of exercise that way. She is not checking her sugars at home. She is taking metformin twice a day. She denies any concerns or complaints with medication. She denies any open sores or wounds. She denies any hypoglycemic events. She feels good.  Hypertension-patient has not taken her medication in 2 days. She denies any chest pains, palpitations, headaches or vision changes.  She does have a papule on her right thumb tip. She has put Neosporin on it but will not go away. It does not hurt. It irritates her and gets caught on things. She has not tried anything else to get rid of this. She has never had anything like this before.  Asthma-will controlled taking Advair 1 puff daily. She is never wore very rarely using her air.  Dyslipidemia-patient had started Saturday of the cut she had had previous intolerance to statins. Her insurance will not cover Zettie and is very expensive. She would like to try another statin. She has been drinking tonic water and thinks it could help with her muscle cramps.     Review of Systems  All other systems reviewed and are negative.      Objective:   Physical Exam  Constitutional: She is oriented to person, place, and time. She appears well-developed and well-nourished.  HENT:  Head: Normocephalic and atraumatic.  Cardiovascular: Normal rate, regular rhythm and normal heart sounds.   Pulmonary/Chest: Effort normal and breath sounds normal. She has no wheezes.  Neurological: She is alert and oriented to person, place, and time.  Skin: Skin is dry.  Lateral distal tip of right thumb 1 cm x 5 mm verrucous raised papule.   Psychiatric: She has a  normal mood and affect. Her behavior is normal.          Assessment & Plan:  DM, controlled- A!C last was 6.4 and today 6.1. Looks great.  continue on metformin twice a day. Patient has been controlled for the last couple of checks. We'll recheck A1c in 6 months. mircoalbumin is normal today. Pt on ACE. Pneumonia vaccine up-to-date. Patient aware she needs exam.  Pneumonia vaccines up-to-date. Flu vaccine given for this year.   HTN- did not take medication today or yesterday. Second blood pressure was slightly better than first. Still over 140/90. She does meet the guidelines for 150/90 which is standard for 65 and older. Patient was encouraged to continue taken her medication daily.  Wart of right thumb-  Cryotherapy Procedure Note  Pre-operative Diagnosis: wart  Post-operative Diagnosis: wart   Locations: distal tip of right thumb at the lateral nail edge.   Indications: irritation  Anesthesia: not required   Procedure Details  History of allergy to iodine: no. Pacemaker? no.  Patient informed of risks (permanent scarring, infection, light or dark discoloration, bleeding, infection, weakness, numbness and recurrence of the lesion) and benefits of the procedure and verbal informed consent obtained.  The areas are treated with liquid nitrogen therapy, frozen until ice ball extended 2 mm beyond lesion, allowed to thaw, and treated again. The patient tolerated procedure well.  The patient was instructed on post-op care, warned that there may be blister formation, redness  and pain. Recommend OTC analgesia as needed for pain.  Condition: Stable  Complications: pain.  Plan: 1. Instructed to keep the area dry and covered for 24-48h and clean thereafter. 2. Warning signs of infection were reviewed.   3. Recommended that the patient use OTC acetaminophen as needed for pain.  4. Return in 2 weeks.   Asthma- will controlled refilled Advair for 6  months.  Dyslipidemia-we'll try Pravachol 3 times a week. We'll recheck a lipid level in 3-6 months. Patient does not feel like she can afford synovia and wants to see if she can tolerate a less frequent dose of a statin without causing muscle cramps.

## 2015-10-26 ENCOUNTER — Ambulatory Visit (INDEPENDENT_AMBULATORY_CARE_PROVIDER_SITE_OTHER): Payer: Medicare PPO

## 2015-10-26 ENCOUNTER — Other Ambulatory Visit: Payer: Medicare PPO

## 2015-10-26 ENCOUNTER — Ambulatory Visit: Payer: Medicare PPO

## 2015-10-26 DIAGNOSIS — Z1231 Encounter for screening mammogram for malignant neoplasm of breast: Secondary | ICD-10-CM | POA: Diagnosis not present

## 2015-10-26 DIAGNOSIS — Z13820 Encounter for screening for osteoporosis: Secondary | ICD-10-CM

## 2015-10-26 DIAGNOSIS — M8580 Other specified disorders of bone density and structure, unspecified site: Secondary | ICD-10-CM

## 2015-10-26 DIAGNOSIS — Z1239 Encounter for other screening for malignant neoplasm of breast: Secondary | ICD-10-CM

## 2015-10-28 ENCOUNTER — Other Ambulatory Visit: Payer: Self-pay | Admitting: Physician Assistant

## 2015-12-01 ENCOUNTER — Other Ambulatory Visit: Payer: Self-pay | Admitting: Physician Assistant

## 2015-12-11 ENCOUNTER — Other Ambulatory Visit: Payer: Self-pay

## 2015-12-11 MED ORDER — IPRATROPIUM-ALBUTEROL 0.5-2.5 (3) MG/3ML IN SOLN: 3.0000 mL | Freq: Four times a day (QID) | RESPIRATORY_TRACT | Status: DC | PRN

## 2015-12-19 ENCOUNTER — Ambulatory Visit (INDEPENDENT_AMBULATORY_CARE_PROVIDER_SITE_OTHER): Payer: Medicare PPO | Admitting: Physician Assistant

## 2015-12-19 ENCOUNTER — Encounter: Payer: Self-pay | Admitting: Physician Assistant

## 2015-12-19 VITALS — BP 129/62 | HR 75 | Temp 97.9°F | Ht 63.0 in | Wt 159.0 lb

## 2015-12-19 DIAGNOSIS — R1013 Epigastric pain: Secondary | ICD-10-CM

## 2015-12-19 DIAGNOSIS — R198 Other specified symptoms and signs involving the digestive system and abdomen: Secondary | ICD-10-CM | POA: Insufficient documentation

## 2015-12-19 DIAGNOSIS — B9681 Helicobacter pylori [H. pylori] as the cause of diseases classified elsewhere: Secondary | ICD-10-CM | POA: Diagnosis not present

## 2015-12-19 DIAGNOSIS — R1115 Cyclical vomiting syndrome unrelated to migraine: Secondary | ICD-10-CM

## 2015-12-19 DIAGNOSIS — R197 Diarrhea, unspecified: Secondary | ICD-10-CM

## 2015-12-19 DIAGNOSIS — R10826 Epigastric rebound abdominal tenderness: Secondary | ICD-10-CM

## 2015-12-19 DIAGNOSIS — K2970 Gastritis, unspecified, without bleeding: Principal | ICD-10-CM

## 2015-12-19 DIAGNOSIS — G43A0 Cyclical vomiting, not intractable: Secondary | ICD-10-CM

## 2015-12-19 LAB — COMPLETE METABOLIC PANEL WITH GFR
ALBUMIN: 4.2 g/dL (ref 3.6–5.1)
ALK PHOS: 51 U/L (ref 33–130)
ALT: 10 U/L (ref 6–29)
AST: 15 U/L (ref 10–35)
BUN: 24 mg/dL (ref 7–25)
CALCIUM: 9.8 mg/dL (ref 8.6–10.4)
CO2: 29 mmol/L (ref 20–31)
CREATININE: 1.12 mg/dL — AB (ref 0.60–0.93)
Chloride: 100 mmol/L (ref 98–110)
GFR, EST AFRICAN AMERICAN: 56 mL/min — AB (ref >=60)
GFR, Est Non African American: 49 mL/min — ABNORMAL LOW (ref >=60)
Glucose, Bld: 115 mg/dL — ABNORMAL HIGH (ref 65–99)
POTASSIUM: 4.6 mmol/L (ref 3.5–5.3)
Sodium: 138 mmol/L (ref 135–146)
Total Bilirubin: 0.4 mg/dL (ref 0.2–1.2)
Total Protein: 6.9 g/dL (ref 6.1–8.1)

## 2015-12-19 LAB — CBC WITH DIFFERENTIAL/PLATELET
Basophils Absolute: 0.1 10*3/uL (ref 0.0–0.1)
Basophils Relative: 2 % — ABNORMAL HIGH (ref 0–1)
Eosinophils Absolute: 0.2 10*3/uL (ref 0.0–0.7)
Eosinophils Relative: 4 % (ref 0–5)
HEMATOCRIT: 36.2 % (ref 36.0–46.0)
HEMOGLOBIN: 12.3 g/dL (ref 12.0–15.0)
LYMPHS ABS: 3.2 10*3/uL (ref 0.7–4.0)
LYMPHS PCT: 55 % — AB (ref 12–46)
MCH: 28 pg (ref 26.0–34.0)
MCHC: 34 g/dL (ref 30.0–36.0)
MCV: 82.3 fL (ref 78.0–100.0)
MONO ABS: 0.8 10*3/uL (ref 0.1–1.0)
MONOS PCT: 13 % — AB (ref 3–12)
MPV: 9.7 fL (ref 8.6–12.4)
NEUTROS ABS: 1.5 10*3/uL — AB (ref 1.7–7.7)
Neutrophils Relative %: 26 % — ABNORMAL LOW (ref 43–77)
Platelets: 262 10*3/uL (ref 150–400)
RBC: 4.4 MIL/uL (ref 3.87–5.11)
RDW: 14.1 % (ref 11.5–15.5)
WBC: 5.9 10*3/uL (ref 4.0–10.5)

## 2015-12-19 LAB — LIPASE: LIPASE: 48 U/L (ref 7–60)

## 2015-12-19 MED ORDER — ONDANSETRON HCL 4 MG PO TABS: 4.0000 mg | ORAL_TABLET | Freq: Three times a day (TID) | ORAL | Status: DC | PRN

## 2015-12-19 MED ORDER — SUCRALFATE 1 G PO TABS: 1.0000 g | ORAL_TABLET | Freq: Four times a day (QID) | ORAL | Status: DC

## 2015-12-19 MED ORDER — OMEPRAZOLE 40 MG PO CPDR: 40.0000 mg | DELAYED_RELEASE_CAPSULE | Freq: Every day | ORAL | Status: DC

## 2015-12-19 NOTE — Patient Instructions (Signed)
Peptic Ulcer ° °A peptic ulcer is a sore in the lining of your esophagus (esophageal ulcer), stomach (gastric ulcer), or in the first part of your small intestine (duodenal ulcer). The ulcer causes erosion into the deeper tissue. °CAUSES  °Normally, the lining of the stomach and the small intestine protects itself from the acid that digests food. The protective lining can be damaged by: °· An infection caused by a bacterium called Helicobacter pylori (H. pylori). °· Regular use of nonsteroidal anti-inflammatory drugs (NSAIDs), such as ibuprofen or aspirin. °· Smoking tobacco. °Other risk factors include being older than 50, drinking alcohol excessively, and having a family history of ulcer disease.  °SYMPTOMS  °· Burning pain or gnawing in the area between the chest and the belly button. °· Heartburn. °· Nausea and vomiting. °· Bloating. °The pain can be worse on an empty stomach and at night. If the ulcer results in bleeding, it can cause: °· Black, tarry stools. °· Vomiting of bright red blood. °· Vomiting of coffee-ground-looking materials. °DIAGNOSIS  °A diagnosis is usually made based upon your history and an exam. Other tests and procedures may be performed to find the cause of the ulcer. Finding a cause will help determine the best treatment. Tests and procedures may include: °· Blood tests, stool tests, or breath tests to check for the bacterium H. pylori. °· An upper gastrointestinal (GI) series of the esophagus, stomach, and small intestine. °· An endoscopy to examine the esophagus, stomach, and small intestine. °· A biopsy. °TREATMENT  °Treatment may include: °· Eliminating the cause of the ulcer, such as smoking, NSAIDs, or alcohol. °· Medicines to reduce the amount of acid in your digestive tract. °· Antibiotic medicines if the ulcer is caused by the H. pylori bacterium. °· An upper endoscopy to treat a bleeding ulcer. °· Surgery if the bleeding is severe or if the ulcer created a hole somewhere in the  digestive system. °HOME CARE INSTRUCTIONS  °· Avoid tobacco, alcohol, and caffeine. Smoking can increase the acid in the stomach, and continued smoking will impair the healing of ulcers. °· Avoid foods and drinks that seem to cause discomfort or aggravate your ulcer. °· Only take medicines as directed by your caregiver. Do not substitute over-the-counter medicines for prescription medicines without talking to your caregiver. °· Keep any follow-up appointments and tests as directed. °SEEK MEDICAL CARE IF:  °· Your do not improve within 7 days of starting treatment. °· You have ongoing indigestion or heartburn. °SEEK IMMEDIATE MEDICAL CARE IF:  °· You have sudden, sharp, or persistent abdominal pain. °· You have bloody or dark black, tarry stools. °· You vomit blood or vomit that looks like coffee grounds. °· You become light-headed, weak, or feel faint. °· You become sweaty or clammy. °MAKE SURE YOU:  °· Understand these instructions. °· Will watch your condition. °· Will get help right away if you are not doing well or get worse. °  °This information is not intended to replace advice given to you by your health care provider. Make sure you discuss any questions you have with your health care provider. °  °Document Released: 10/25/2000 Document Revised: 11/18/2014 Document Reviewed: 05/27/2012 °Elsevier Interactive Patient Education ©2016 [employer] ° °

## 2015-12-19 NOTE — Progress Notes (Addendum)
   Subjective:    Patient ID: Lindsey Fischer, female    DOB: [birth year], 75 y.o.   MRN: [id number]  HPI  Patient presents with non-radiating, 10/10, sharp, epigastric pain with associated nausea, vomiting, diarrhea, and abdominal distension for the past three weeks. Patient states that pain is not worsened or alleviated by eating. Patient states that she normally has pain at 5:00 pm every day. However, today she experienced the pain at 5:00 am. Patient states that she has taken Maalox which has provided limited relief. Patient has been afebrile. Patient denies cough, a history of GERD, back pain, black tarry stools or stools with mucous.   Review of Systems Please see HPI     Objective:   Physical Exam  Constitutional: She is oriented to person, place, and time. She appears well-developed and well-nourished. No distress.  HENT:  Head: Normocephalic and atraumatic.  Right Ear: External ear normal.  Left Ear: External ear normal.  Eyes: Conjunctivae and EOM are normal. Pupils are equal, round, and reactive to light. Right eye exhibits no discharge. Left eye exhibits no discharge.  Patient has pinpoint pupils.   Neck: Normal range of motion. Neck supple.  Abdominal:  Patient has a visible umbilical hernia with no other scars and lesions. Patient has positive bowl sounds in all four quadrants. Patient exhibits tenderness in guarding in all four quadrants but is the most tender in the epigastric region.   Musculoskeletal: Normal range of motion.  Neurological: She is alert and oriented to person, place, and time.  Skin: Skin is warm and dry. She is not diaphoretic.  Psychiatric: She has a normal mood and affect. Her behavior is normal. Judgment and thought content normal.         Assessment & Plan:   1. Epigastric Abdominal Pain with rebound/abdominal guadring/diarrhea/nausea and vomiting- Differential diagnosis includes suspected peptic ulcer, hiatal hernia, diverticulitis,pancreatitis  and small bowl obstruction. Index of suspicion is strong for peptic ulcer. Patient underwent H. Pylori testing in office today and will undergo a CT of the abdomen pending appropriate CMP results. Patient took GI cocktail in the office in order to relieve pain. zofran sent to pharmacy for nausea.CBC and lipase. Sent to pharmacy omeprazole and carafate to start treatment for PUD. Discussed certain foods that could exacerbate.  Will call with results.

## 2015-12-19 NOTE — Progress Notes (Signed)
   Subjective:    Patient ID: Lindsey Fischer, female    DOB: Apr 05, 1941, 75 y.o.   MRN: [id number]  HPI    Review of Systems     Objective:   Physical Exam        Assessment & Plan:

## 2015-12-20 ENCOUNTER — Ambulatory Visit (HOSPITAL_BASED_OUTPATIENT_CLINIC_OR_DEPARTMENT_OTHER)
Admission: RE | Admit: 2015-12-20 | Discharge: 2015-12-20 | Disposition: A | Discharge status: Home / Self Care | Payer: Medicare FFS | Source: Ambulatory Visit | Attending: Physician Assistant | Admitting: Physician Assistant

## 2015-12-20 ENCOUNTER — Encounter (HOSPITAL_BASED_OUTPATIENT_CLINIC_OR_DEPARTMENT_OTHER): Payer: Self-pay

## 2015-12-20 DIAGNOSIS — G43A0 Cyclical vomiting, not intractable: Secondary | ICD-10-CM | POA: Diagnosis not present

## 2015-12-20 DIAGNOSIS — R197 Diarrhea, unspecified: Secondary | ICD-10-CM | POA: Insufficient documentation

## 2015-12-20 DIAGNOSIS — R10826 Epigastric rebound abdominal tenderness: Secondary | ICD-10-CM | POA: Diagnosis not present

## 2015-12-20 DIAGNOSIS — R1115 Cyclical vomiting syndrome unrelated to migraine: Secondary | ICD-10-CM

## 2015-12-20 DIAGNOSIS — K2970 Gastritis, unspecified, without bleeding: Secondary | ICD-10-CM | POA: Insufficient documentation

## 2015-12-20 DIAGNOSIS — B9681 Helicobacter pylori [H. pylori] as the cause of diseases classified elsewhere: Secondary | ICD-10-CM | POA: Insufficient documentation

## 2015-12-20 DIAGNOSIS — R109 Unspecified abdominal pain: Secondary | ICD-10-CM | POA: Diagnosis present

## 2015-12-20 DIAGNOSIS — R110 Nausea: Secondary | ICD-10-CM | POA: Diagnosis not present

## 2015-12-20 DIAGNOSIS — K760 Fatty (change of) liver, not elsewhere classified: Secondary | ICD-10-CM | POA: Diagnosis not present

## 2015-12-20 DIAGNOSIS — K5750 Diverticulosis of both small and large intestine without perforation or abscess without bleeding: Secondary | ICD-10-CM

## 2015-12-20 DIAGNOSIS — R198 Other specified symptoms and signs involving the digestive system and abdomen: Secondary | ICD-10-CM | POA: Diagnosis not present

## 2015-12-20 DIAGNOSIS — K5790 Diverticulosis of intestine, part unspecified, without perforation or abscess without bleeding: Secondary | ICD-10-CM | POA: Insufficient documentation

## 2015-12-20 DIAGNOSIS — K2990 Gastroduodenitis, unspecified, without bleeding: Secondary | ICD-10-CM

## 2015-12-20 LAB — H. PYLORI BREATH TEST: H. pylori Breath Test: DETECTED — AB

## 2015-12-20 MED ORDER — CLARITHROMYCIN 500 MG PO TABS: 500.0000 mg | ORAL_TABLET | Freq: Two times a day (BID) | ORAL | Status: DC

## 2015-12-20 MED ORDER — METRONIDAZOLE 500 MG PO TABS: 500.0000 mg | ORAL_TABLET | Freq: Two times a day (BID) | ORAL | Status: DC

## 2015-12-20 MED ORDER — IOHEXOL 300 MG/ML  SOLN
100.0000 mL | Freq: Once | INTRAMUSCULAR | Status: AC | PRN
  Administered 2015-12-20: 100 mL via INTRAVENOUS

## 2015-12-20 NOTE — Addendum Note (Signed)
Addended by: [name] on: 12/20/2015 01:10 PM   Modules accepted: Orders

## 2015-12-21 ENCOUNTER — Telehealth: Payer: Self-pay | Admitting: *Deleted

## 2015-12-21 DIAGNOSIS — R7989 Other specified abnormal findings of blood chemistry: Secondary | ICD-10-CM

## 2015-12-21 NOTE — Telephone Encounter (Signed)
Labs ordered to check BMP.

## 2015-12-23 LAB — BASIC METABOLIC PANEL WITH GFR
BUN: 15 mg/dL (ref 7–25)
CALCIUM: 9.4 mg/dL (ref 8.6–10.4)
CO2: 30 mmol/L (ref 20–31)
Chloride: 99 mmol/L (ref 98–110)
Creat: 0.89 mg/dL (ref 0.60–0.93)
GFR, EST AFRICAN AMERICAN: 74 mL/min (ref >=60)
GFR, EST NON AFRICAN AMERICAN: 64 mL/min (ref >=60)
GLUCOSE: 97 mg/dL (ref 65–99)
Potassium: 4.8 mmol/L (ref 3.5–5.3)
Sodium: 138 mmol/L (ref 135–146)

## 2016-01-05 ENCOUNTER — Ambulatory Visit: Payer: Medicare FFS | Admitting: Physician Assistant

## 2016-01-23 ENCOUNTER — Encounter: Payer: Self-pay | Admitting: Physician Assistant

## 2016-01-23 ENCOUNTER — Ambulatory Visit (INDEPENDENT_AMBULATORY_CARE_PROVIDER_SITE_OTHER): Payer: Medicare FFS | Admitting: Physician Assistant

## 2016-01-23 DIAGNOSIS — A048 Other specified bacterial intestinal infections: Secondary | ICD-10-CM | POA: Diagnosis not present

## 2016-01-23 DIAGNOSIS — B9681 Helicobacter pylori [H. pylori] as the cause of diseases classified elsewhere: Secondary | ICD-10-CM | POA: Diagnosis not present

## 2016-01-23 DIAGNOSIS — I10 Essential (primary) hypertension: Secondary | ICD-10-CM | POA: Diagnosis not present

## 2016-01-23 DIAGNOSIS — J069 Acute upper respiratory infection, unspecified: Secondary | ICD-10-CM

## 2016-01-23 DIAGNOSIS — B9789 Other viral agents as the cause of diseases classified elsewhere: Secondary | ICD-10-CM

## 2016-01-23 DIAGNOSIS — K2970 Gastritis, unspecified, without bleeding: Principal | ICD-10-CM

## 2016-01-23 MED ORDER — BENZONATATE 100 MG PO CAPS: 100.0000 mg | ORAL_CAPSULE | Freq: Three times a day (TID) | ORAL | Status: DC | PRN

## 2016-01-23 NOTE — Progress Notes (Signed)
   Subjective:    Patient ID: Lindsey Fischer, female    DOB: 07-Nov-1941, 75 y.o.   MRN: [id number]  HPI Pt presents to the clinic for breath test to make sure h.pylori has cleared. She is feeling much better. No more abdominal pain, nausea, or bad taste in mouth. She finished abx 2 weeks ago. She continues to take carafate and omeprazole.   She did start having URI symptoms 2 days ago. She has dry cough. No fever, chills, wheezing, SOB. She is taking OTC cough meds that help some. She denies any sinus pressure, ST, or ear pain.    Review of Systems    see HPI.  Objective:   Physical Exam  Constitutional: She is oriented to person, place, and time. She appears well-developed and well-nourished.  HENT:  Head: Normocephalic and atraumatic.  Right Ear: External ear normal.  Left Ear: External ear normal.  Mouth/Throat: Oropharynx is clear and moist. No oropharyngeal exudate.  TM's clear.  Oropharynx erythematous. No tonsil swelling or exudate.  Bilateral nasal turbinates red and swollen.   Eyes:  Slightly injected conjunctiva with watery discharge.   Neck: Normal range of motion. Neck supple.  Cardiovascular: Normal rate, regular rhythm and normal heart sounds.   Pulmonary/Chest: Effort normal and breath sounds normal. She has no wheezes.  Abdominal: Soft. Bowel sounds are normal.  Mild tenderness over LUQ. No guarding or rebound.   Lymphadenopathy:    She has no cervical adenopathy.  Neurological: She is alert and oriented to person, place, and time.  Psychiatric: She has a normal mood and affect. Her behavior is normal.          Assessment & Plan:  H.pylori infection- cannot do breath test today since pt is still on PPI. She was instructed to stop carafate and omeprazole and follow up for breath test in 2 weeks. Exam today reassuring h.pylori cleared.   HTN- elevated today. Did not take BP medication this morning. Taking OTC medications for URI.  Will recheck in 2 weeks.  Make sure take BP medication.   Viral URI with cough- discussed symptomatic care with OTC mucinex. Stay hydrated. Stay away from sudafed with HTN. Use albuterol inhaler if needed. Follow up if symptoms worsen or do not improve.

## 2016-02-06 ENCOUNTER — Ambulatory Visit (INDEPENDENT_AMBULATORY_CARE_PROVIDER_SITE_OTHER): Payer: Medicare FFS | Admitting: Physician Assistant

## 2016-02-06 VITALS — BP 145/65 | HR 76 | Wt 156.0 lb

## 2016-02-06 DIAGNOSIS — B9681 Helicobacter pylori [H. pylori] as the cause of diseases classified elsewhere: Secondary | ICD-10-CM

## 2016-02-06 DIAGNOSIS — K253 Acute gastric ulcer without hemorrhage or perforation: Secondary | ICD-10-CM

## 2016-02-06 NOTE — Progress Notes (Signed)
Lindsey Fischer is here for a breath test to check for resolution of h. Pylori. Sample collected without complications.

## 2016-02-07 LAB — H. PYLORI BREATH TEST: H. pylori Breath Test: NOT DETECTED

## 2016-03-09 ENCOUNTER — Other Ambulatory Visit: Payer: Self-pay | Admitting: Physician Assistant

## 2016-04-03 LAB — HM DIABETES EYE EXAM

## 2016-04-22 ENCOUNTER — Ambulatory Visit (INDEPENDENT_AMBULATORY_CARE_PROVIDER_SITE_OTHER): Payer: Medicare FFS | Admitting: Physician Assistant

## 2016-04-22 ENCOUNTER — Encounter: Payer: Self-pay | Admitting: Physician Assistant

## 2016-04-22 VITALS — BP 135/65 | HR 75 | Ht 63.0 in | Wt 160.0 lb

## 2016-04-22 DIAGNOSIS — E119 Type 2 diabetes mellitus without complications: Secondary | ICD-10-CM

## 2016-04-22 DIAGNOSIS — J4520 Mild intermittent asthma, uncomplicated: Secondary | ICD-10-CM | POA: Diagnosis not present

## 2016-04-22 DIAGNOSIS — I10 Essential (primary) hypertension: Secondary | ICD-10-CM | POA: Diagnosis not present

## 2016-04-22 DIAGNOSIS — E785 Hyperlipidemia, unspecified: Secondary | ICD-10-CM | POA: Diagnosis not present

## 2016-04-22 LAB — LIPID PANEL
CHOL/HDL RATIO: 4.7 ratio (ref <=5.0)
Cholesterol: 236 mg/dL — ABNORMAL HIGH (ref 125–200)
HDL: 50 mg/dL (ref >=46)
LDL CALC: 133 mg/dL — AB (ref <130)
TRIGLYCERIDES: 266 mg/dL — AB (ref <150)
VLDL: 53 mg/dL — AB (ref <30)

## 2016-04-22 LAB — COMPLETE METABOLIC PANEL WITH GFR
ALT: 11 U/L (ref 6–29)
AST: 15 U/L (ref 10–35)
Albumin: 3.9 g/dL (ref 3.6–5.1)
Alkaline Phosphatase: 49 U/L (ref 33–130)
BUN: 17 mg/dL (ref 7–25)
CHLORIDE: 102 mmol/L (ref 98–110)
CO2: 28 mmol/L (ref 20–31)
CREATININE: 0.93 mg/dL (ref 0.60–0.93)
Calcium: 9.4 mg/dL (ref 8.6–10.4)
GFR, EST AFRICAN AMERICAN: 70 mL/min (ref >=60)
GFR, Est Non African American: 61 mL/min (ref >=60)
Glucose, Bld: 117 mg/dL — ABNORMAL HIGH (ref 65–99)
POTASSIUM: 4 mmol/L (ref 3.5–5.3)
Sodium: 138 mmol/L (ref 135–146)
Total Bilirubin: 0.4 mg/dL (ref 0.2–1.2)
Total Protein: 6.5 g/dL (ref 6.1–8.1)

## 2016-04-22 LAB — POCT GLYCOSYLATED HEMOGLOBIN (HGB A1C): Hemoglobin A1C: 6.3

## 2016-04-22 MED ORDER — METFORMIN HCL 500 MG PO TABS: ORAL_TABLET | ORAL | Status: DC

## 2016-04-22 MED ORDER — FLUTICASONE-SALMETEROL 250-50 MCG/DOSE IN AEPB: INHALATION_SPRAY | RESPIRATORY_TRACT | Status: DC

## 2016-04-22 MED ORDER — LISINOPRIL-HYDROCHLOROTHIAZIDE 20-25 MG PO TABS: ORAL_TABLET | ORAL | Status: DC

## 2016-04-22 NOTE — Progress Notes (Signed)
   Subjective:    Patient ID: Lindsey Fischer, female    DOB: Dec 17, 1940, 75 y.o.   MRN: [id number]  HPI  Pt is a 75 yo female who presents to the clinic for 6 month follow up.   She started pravachol. She is able to tolerate it. She has not been strict about any diet changes. Need to check lipid.   She is on metformin. She does not check sugars. She denies any hypoglycemic events. She does not have any wounds or wounds. She just had eye exam at [place].   Asthma is controlled with advair. She only uses ventolin as needed once or twice a week.   No CP, palpitations, headaches or dizziness. Takes hyzaar for BP.    Review of Systems  All other systems reviewed and are negative.      Objective:   Physical Exam  Constitutional: She is oriented to person, place, and time. She appears well-developed and well-nourished.  HENT:  Head: Normocephalic and atraumatic.  Cardiovascular: Normal rate, regular rhythm and normal heart sounds.   Pulmonary/Chest: Effort normal and breath sounds normal.  Neurological: She is alert and oriented to person, place, and time.  Skin: Skin is dry.  Psychiatric: She has a normal mood and affect. Her behavior is normal.          Assessment & Plan:  Hyperlipidemia- last rechecked 07/2015. Will recheck today since started pravachol. Doing well without side efffects.   DM, type II- A!C up to 6.3 from 6.1. Discussed low sugar/low carb diet. Continue on metformin. Will call for copy of eye exam. Follow up in 6 months. On STATIN on ACE.   Asthma- refilled advair. Ventolin as needed.   HTN- refilled hyzaar. cmp ordered. Follow up in 6 months.

## 2016-04-23 ENCOUNTER — Other Ambulatory Visit: Payer: Self-pay | Admitting: Physician Assistant

## 2016-05-01 ENCOUNTER — Other Ambulatory Visit: Payer: Self-pay | Admitting: *Deleted

## 2016-05-01 MED ORDER — LISINOPRIL-HYDROCHLOROTHIAZIDE 20-25 MG PO TABS: ORAL_TABLET | ORAL | Status: DC

## 2016-05-01 MED ORDER — PRAVASTATIN SODIUM 40 MG PO TABS: 40.0000 mg | ORAL_TABLET | Freq: Every day | ORAL | Status: DC

## 2016-05-23 ENCOUNTER — Encounter: Payer: Self-pay | Admitting: Physician Assistant

## 2016-05-29 ENCOUNTER — Ambulatory Visit (INDEPENDENT_AMBULATORY_CARE_PROVIDER_SITE_OTHER): Payer: Medicare FFS | Admitting: Family Medicine

## 2016-05-29 ENCOUNTER — Encounter: Payer: Self-pay | Admitting: Family Medicine

## 2016-05-29 VITALS — BP 123/69 | HR 81 | Temp 98.1°F | Wt 155.0 lb

## 2016-05-29 DIAGNOSIS — J45901 Unspecified asthma with (acute) exacerbation: Secondary | ICD-10-CM

## 2016-05-29 MED ORDER — METHYLPREDNISOLONE ACETATE 80 MG/ML IJ SUSP: 80.0000 mg | Freq: Once | INTRAMUSCULAR | Status: DC

## 2016-05-29 MED ORDER — DOXYCYCLINE HYCLATE 100 MG PO TABS: ORAL_TABLET | ORAL | Status: AC

## 2016-05-29 NOTE — Progress Notes (Signed)
CC: Lindsey Fischer is a 75 y.o. female is here for Cough and Fatigue   Subjective: HPI:  For the past 3 weeks she's been experiencing productive cough with fatigue and occasional wheezing. Symptoms are temporarily improved with the DuoNeb. She is using Advair as well. She is not sure what makes symptoms better or worse other than that described above. She did she may have had a fever yesterday. She denies any chills. She denies any shortness of breath or chest discomfort. She denies nasal congestion or sinus pressure.   Review Of Systems Outlined In HPI  Past Medical History  Diagnosis Date  . Asthma   . Hypertension   . Hyperlipidemia   . Diabetes mellitus     pre diabetes    Past Surgical History  Procedure Laterality Date  . Ovarian cyst removal    . Appendectomy     Family History  Problem Relation Age of Onset  . Diabetes Mother   . Heart attack Father 63    deceased  . Cancer Sister     brain tumor  . Cancer Brother     prostrate and lung CA  . COPD Sister     Social History   Social History  . Marital Status: Married    Spouse Name: N/A  . Number of Children: N/A  . Years of Education: N/A   Occupational History  . Not on file.   Social History Main Topics  . Smoking status: Former Smoker    Quit date: 11/11/1980  . Smokeless tobacco: Never Used  . Alcohol Use: No  . Drug Use: No  . 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   Other Topics Concern  . Not on file   Social History Narrative     Objective: BP 123/69 mmHg  Pulse 81  Temp(Src) 98.1 F (36.7 C) (Oral)  Wt 155 lb (70.308 kg)  SpO2 94%  General: Alert and Oriented, No Acute Distress HEENT: Pupils equal, round, reactive to light. Conjunctivae clear.  External ears unremarkable, canals clear with intact TMs with appropriate landmarks.  Middle ear appears open without effusion. Pink inferior turbinates.  Moist mucous membranes, pharynx  without inflammation nor lesions.  Neck supple without palpable lymphadenopathy nor abnormal masses. Lungs: Clear to auscultation bilaterally, no wheezing/ronchi/rales.  Comfortable work of breathing. Good air movement. Cardiac: Regular rate and rhythm. Normal S1/S2.  No murmurs, rubs, nor gallops.   Mental Status: No depression, anxiety, nor agitation. Skin: Warm and dry.  Assessment & Plan: Lindsey Fischer was seen today for cough and fatigue.  Diagnoses and all orders for this visit:  Asthma with exacerbation, unspecified asthma severity -     methylPREDNISolone acetate (DEPO-MEDROL) injection 80 mg; Inject 1 mL (80 mg total) into the muscle once.  Other orders -     doxycycline (VIBRA-TABS) 100 MG tablet; One by mouth twice a day for ten days.  Asthma exacerbation: Start doxycycline encouraged her to also take a corticosteroid she preferred IM delivery as opposed to prednisone.  Return if symptoms worsen or fail to improve.

## 2016-10-22 ENCOUNTER — Encounter: Payer: Self-pay | Admitting: Physician Assistant

## 2016-10-22 ENCOUNTER — Ambulatory Visit (INDEPENDENT_AMBULATORY_CARE_PROVIDER_SITE_OTHER): Payer: Medicare FFS | Admitting: Physician Assistant

## 2016-10-22 VITALS — BP 142/76 | HR 77 | Ht 63.0 in | Wt 160.0 lb

## 2016-10-22 DIAGNOSIS — R0789 Other chest pain: Secondary | ICD-10-CM | POA: Insufficient documentation

## 2016-10-22 DIAGNOSIS — E118 Type 2 diabetes mellitus with unspecified complications: Secondary | ICD-10-CM

## 2016-10-22 DIAGNOSIS — G4452 New daily persistent headache (NDPH): Secondary | ICD-10-CM | POA: Insufficient documentation

## 2016-10-22 DIAGNOSIS — G44219 Episodic tension-type headache, not intractable: Secondary | ICD-10-CM

## 2016-10-22 LAB — GLUCOSE, POCT (MANUAL RESULT ENTRY): POC GLUCOSE: 116 mg/dL — AB (ref 70–99)

## 2016-10-22 LAB — POCT GLYCOSYLATED HEMOGLOBIN (HGB A1C): HEMOGLOBIN A1C: 6.4

## 2016-10-22 NOTE — Progress Notes (Addendum)
Subjective:    Patient ID: Lindsey Fischer, female    DOB: 1941-02-06, 75 y.o.   MRN: [id number]  HPI Patient is a 75 yo female coming in today for a type 2 diabetes mellitus follow-up. Patient reports her blood sugars run around 94-116. Patient denies hypoglycemic episodes. Patient denies foot ulcers.   Patient reports that she has been experiencing a sharp pain in the middle of her chest for two months. She reports that the pain lasts a few seconds and then goes away. She reports that the pain does not radiate. She denies shortness of breath and diaphoresis. She notices that the chest pain occurs more at work. 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   She also reports a headache in the back of right head that started two days ago. Dull pain, rates the pain a 4/10. She reports it went away and came back yesterday. She took two 325 mg of aspirin with relief. Patient denies dizziness or lead-headedness. She denies numbness or tingling of extremities.  Past Medical History:  Diagnosis Date  . Asthma   . Diabetes mellitus    pre diabetes  . Hyperlipidemia   . Hypertension    Outpatient Encounter Prescriptions as of 10/22/2016  Medication Sig  . AMBULATORY NON FORMULARY MEDICATION Nebulizer machine and tubing  Dx:  COPD exacerbation  . AMBULATORY NON FORMULARY MEDICATION zostavax Im injection once.  . AMBULATORY NON FORMULARY MEDICATION 2 (two) times daily. Osteobiflex  . ferrous sulfate 325 (65 FE) MG tablet Take 325 mg by mouth daily with breakfast.  . Fluticasone-Salmeterol (ADVAIR DISKUS) 250-50 MCG/DOSE AEPB inhale 1 puff INTO THE LUNGS every 12 hours  . ipratropium-albuterol (DUONEB) 0.5-2.5 (3) MG/3ML SOLN Take 3 mLs by nebulization every 6 (six) hours as needed.  Marland Kitchen lisinopril-hydrochlorothiazide (PRINZIDE,ZESTORETIC) 20-25 MG tablet take 1 tablet by mouth daily  . magnesium 30 MG tablet Take 30 mg by mouth daily.  . metFORMIN  (GLUCOPHAGE) 500 MG tablet take 1 tablet by mouth once daily  . ondansetron (ZOFRAN) 4 MG tablet Take 1 tablet (4 mg total) by mouth every 8 (eight) hours as needed for nausea or vomiting.  . ONE TOUCH ULTRA TEST test strip USE TO CHECK BLOOD SUGAR TWICE A DAY  . pravastatin (PRAVACHOL) 40 MG tablet Take 1 tablet (40 mg total) by mouth daily.  . VENTOLIN HFA 108 (90 Base) MCG/ACT inhaler INHALE 2 PUFFS BY MOUTH ONCE DAILY AS DIRECTED   No facility-administered encounter medications on file as of 10/22/2016.    Allergies  Allergen Reactions  . Livalo [Pitavastatin]     Muscle cramps  . Mucinex [Guaifenesin Er]     Made blood sugar go up to 212.   Marland Kitchen Peanut-Containing Drug Products     REACTION: Restricted breathing  . Pravastatin     Muscle cramps  . Prednisone     Bloating and abdominal pain.   Marland Kitchen Zocor [Simvastatin]     Muscle cramps      Review of Systems  All other systems reviewed and are negative.      Objective:   Physical Exam  Constitutional: She is oriented to person, place, and time. She appears well-developed and well-nourished.  Cardiovascular: Normal rate and regular rhythm.  Exam reveals no gallop and no friction rub.   No murmur heard. Pulmonary/Chest: Effort normal and breath sounds normal. No respiratory distress. She has no wheezes. She has no  rales. She exhibits no tenderness.  Neurological: She is alert and oriented to person, place, and time.  Psychiatric: She has a normal mood and affect. Her behavior is normal.  Diabetic foot exam: Full sensation to vibration and monofilament of right and left feet.   EKG: Normal rate, Non-specific changes noted in the RS segment of lead II, V3, V6. Last EKG was 2013 and changed from 2013.  Hgb A1c: 6.4     Assessment & Plan:  Yuriria was seen today for diabetes.  Diagnoses and all orders for this visit:  1. Type 2 diabetes mellitus with complication, without long-term current use of insulin (HCC) -POCT HgB A1C  result 6.4- Patient is well controlled on Metformin. Continue monitoring fasting glucose daily. - POCT Glucose (CBG) 160  -Follow-up in 6 months to re-check Hgb A1c.   2. Atypical chest pain -EKG 12-Lead due to patient experiencing chest pain for 2 months.  -Exercise Tolerance test due to patient experiencing recurrent chest pain for 2 months and non-specific EKG changes.  -Will follow-up after stress test   3.  Headache        -Advised patient to take NSAID as needed for headache. Consider rice bag to help with muscle tension of neck muscles.

## 2016-10-23 ENCOUNTER — Encounter: Payer: Self-pay | Admitting: Physician Assistant

## 2016-10-24 ENCOUNTER — Ambulatory Visit (INDEPENDENT_AMBULATORY_CARE_PROVIDER_SITE_OTHER): Payer: Medicare FFS

## 2016-10-24 DIAGNOSIS — R0789 Other chest pain: Secondary | ICD-10-CM | POA: Diagnosis not present

## 2016-10-24 LAB — EXERCISE TOLERANCE TEST
CSEPED: 4 min
CSEPEW: 6.4 METS
CSEPHR: 144 %
CSEPPHR: 144 {beats}/min
Exercise duration (sec): 30 s
MPHR: 145 {beats}/min
RPE: 17
Rest HR: 68 {beats}/min

## 2016-10-25 NOTE — Progress Notes (Signed)
Call pt: stress test was negative for any signs of issues with heart. BP did increase with exercise as expected.

## 2016-11-12 ENCOUNTER — Emergency Department (INDEPENDENT_AMBULATORY_CARE_PROVIDER_SITE_OTHER)
Admission: EM | Admit: 2016-11-12 | Discharge: 2016-11-12 | Disposition: A | Discharge status: Home / Self Care | Payer: Medicare FFS | Source: Home / Self Care | Attending: Family Medicine | Admitting: Family Medicine

## 2016-11-12 DIAGNOSIS — J069 Acute upper respiratory infection, unspecified: Secondary | ICD-10-CM

## 2016-11-12 DIAGNOSIS — B9789 Other viral agents as the cause of diseases classified elsewhere: Secondary | ICD-10-CM

## 2016-11-12 DIAGNOSIS — J9801 Acute bronchospasm: Secondary | ICD-10-CM

## 2016-11-12 MED ORDER — METHYLPREDNISOLONE ACETATE 80 MG/ML IJ SUSP
80.0000 mg | Freq: Once | INTRAMUSCULAR | Status: AC
  Administered 2016-11-12: 80 mg via INTRAMUSCULAR

## 2016-11-12 MED ORDER — AMOXICILLIN 875 MG PO TABS: 875.0000 mg | ORAL_TABLET | Freq: Two times a day (BID) | ORAL | 0 refills | Status: DC

## 2016-11-12 NOTE — ED Triage Notes (Signed)
Started Sunday night.  Had coughing, head ache, and sneezing yesterday.

## 2016-11-12 NOTE — ED Provider Notes (Signed)
[facility]    CSN: [id number] Arrival date & time: 11/12/16  1506     History   Chief Complaint Chief Complaint  Patient presents with  . Cough  . Nasal Congestion    HPI Lindsey Fischer is a 76 y.o. female.   Patient complains of 3 to 4 day history of typical cold-like symptoms developing over several days, including mild sore throat, sinus congestion, headache, fatigue, low grade fever, and cough.  She has a history of asthma.   The history is provided by the patient.    Past Medical History:  Diagnosis Date  . Asthma   . Diabetes mellitus    pre diabetes  . Hyperlipidemia   . Hypertension     Patient Active Problem List   Diagnosis Date Noted  . Atypical chest pain 10/22/2016  . New daily persistent headache 10/22/2016  . Hyperlipidemia 04/22/2016  . Diverticulosis 12/20/2015  . Gastritis and gastroduodenitis 12/20/2015  . Helicobacter pylori gastritis 12/20/2015  . Epigastric abdominal tenderness with rebound tenderness 12/19/2015  . Non-intractable cyclical vomiting with nausea 12/19/2015  . Abdominal guarding 12/19/2015  . Diarrhea 12/19/2015  . Umbilical hernia [id number]  . DM (diabetes mellitus) ([place]) 11/15/2012  . Metabolic syndrome X [id number]  . GRIEF REACTION, ACUTE 03/27/2010  . ESSENTIAL HYPERTENSION, BENIGN 03/27/2010  . IMPAIRED FASTING GLUCOSE 03/27/2010  . Dyslipidemia 01/19/2010  . Asthma 01/19/2010    Past Surgical History:  Procedure Laterality Date  . APPENDECTOMY    . OVARIAN CYST REMOVAL      OB History    No data available       Home Medications    Prior to Admission medications   Medication Sig Start Date End Date Taking? Authorizing Provider  AMBULATORY NON FORMULARY MEDICATION Nebulizer machine and tubing  Dx:  COPD exacerbation 05/06/11   [name], DO  AMBULATORY NON FORMULARY MEDICATION zostavax Im injection once. 05/17/13   [name], PA-C  AMBULATORY NON FORMULARY MEDICATION 2 (two)  times daily. Osteobiflex    Historical Provider, MD  amoxicillin (AMOXIL) 875 MG tablet Take 1 tablet (875 mg total) by mouth 2 (two) times daily. (Rx void after 11/20/16) 11/12/16   Kandra Nicolas, MD  ferrous sulfate 325 (65 FE) MG tablet Take 325 mg by mouth daily with breakfast.    Historical Provider, MD  Fluticasone-Salmeterol (ADVAIR DISKUS) 250-50 MCG/DOSE AEPB inhale 1 puff INTO THE LUNGS every 12 hours 04/22/16   [name], PA-C  ipratropium-albuterol (DUONEB) 0.5-2.5 (3) MG/3ML SOLN Take 3 mLs by nebulization every 6 (six) hours as needed. 12/11/15   [name], PA-C  lisinopril-hydrochlorothiazide (PRINZIDE,ZESTORETIC) 20-25 MG tablet take 1 tablet by mouth daily 05/01/16   [name], PA-C  magnesium 30 MG tablet Take 30 mg by mouth daily.    Historical Provider, MD  metFORMIN (GLUCOPHAGE) 500 MG tablet take 1 tablet by mouth once daily 04/22/16   [name], PA-C  ondansetron (ZOFRAN) 4 MG tablet Take 1 tablet (4 mg total) by mouth every 8 (eight) hours as needed for nausea or vomiting. 12/19/15   [name], PA-C  ONE TOUCH ULTRA TEST test strip USE TO CHECK BLOOD SUGAR TWICE A DAY 10/30/15   Sean Hommel, DO  pravastatin (PRAVACHOL) 40 MG tablet Take 1 tablet (40 mg total) by mouth daily. 05/01/16   [name], PA-C  VENTOLIN HFA 108 (90 Base) MCG/ACT inhaler INHALE 2 PUFFS BY MOUTH ONCE DAILY AS DIRECTED  03/12/16   [name], PA-C    Family History Family History  Problem Relation Age of Onset  . Diabetes Mother   . Heart attack Father 31    deceased  . Cancer Sister     brain tumor  . Cancer Brother     prostrate and lung CA  . COPD Sister     Social History Social History  Substance Use Topics  . Smoking status: Former Smoker    Quit date: 11/11/1980  . Smokeless tobacco: Never Used  . Alcohol use No     Allergies   Livalo [pitavastatin]; Mucinex [guaifenesin er]; Peanut-containing drug products; Pravastatin; Prednisone; and Zocor  [simvastatin]   Review of Systems Review of Systems + sore throat + cough No pleuritic pain ? wheezing + nasal congestion + post-nasal drainage No sinus pain/pressure No itchy/red eyes No earache No hemoptysis No SOB + fever, + chills No nausea No vomiting No abdominal pain No diarrhea No urinary symptoms No skin rash + fatigue No myalgias + headache Used OTC meds without relief   Physical Exam Triage Vital Signs ED Triage Vitals  Enc Vitals Group     BP 11/12/16 1638 152/74     Pulse Rate 11/12/16 1638 85     Resp --      Temp 11/12/16 1638 97.7 F (36.5 C)     Temp Source 11/12/16 1638 Oral     SpO2 11/12/16 1638 95 %     Weight 11/12/16 1639 160 lb 6.4 oz (72.8 kg)     Height 11/12/16 1639 5\' 3"  (1.6 m)     Head Circumference --      Peak Flow --      Pain Score 11/12/16 1640 0     Pain Loc --      Pain Edu? --      Excl. in [place]? --    No data found.   Updated Vital Signs BP 152/74 (BP Location: Left Arm)   Pulse 85   Temp 97.7 F (36.5 C) (Oral)   Ht 5\' 3"  (1.6 m)   Wt 160 lb 6.4 oz (72.8 kg)   SpO2 95%   BMI 28.41 kg/m   Visual Acuity Right Eye Distance:   Left Eye Distance:   Bilateral Distance:    Right Eye Near:   Left Eye Near:    Bilateral Near:     Physical Exam Nursing notes and Vital Signs reviewed. Appearance:  Patient appears stated age, and in no acute distress Eyes:  Pupils are equal, round, and reactive to light and accomodation.  Extraocular movement is intact.  Conjunctivae are not inflamed  Ears:  Canals normal.  Tympanic membranes normal.  Nose:  Mildly congested turbinates.  No sinus tenderness.  Pharynx:  Normal Neck:  Supple.  Tender enlarged posterior/lateral nodes are palpated bilaterally  Lungs:  Few scattered faint wheezes posteriorly.  Breath sounds are equal.  Moving air well. Heart:  Regular rate and rhythm without murmurs, rubs, or gallops.  Abdomen:  Nontender without masses or hepatosplenomegaly.  Bowel  sounds are present.  No CVA or flank tenderness.  Extremities:  No edema.  Skin:  No rash present.    UC Treatments / Results  Labs (all labs ordered are listed, but only abnormal results are displayed) Labs Reviewed - No data to display  EKG  EKG Interpretation None       Radiology No results found.  Procedures Procedures (including critical care time)  Medications Ordered  in UC Medications  methylPREDNISolone acetate (DEPO-MEDROL) injection 80 mg (not administered)     Initial Impression / Assessment and Plan / UC Course  I have reviewed the triage vital signs and the nursing notes.  Pertinent labs & imaging results that were available during my care of the patient were reviewed by me and considered in my medical decision making (see chart for details).  Clinical Course   There is no evidence of bacterial infection today.   Administered Depo Medrol 80mg  IM  Take plain guaifenesin (1200mg  extended release tabs such as Mucinex) twice daily, with plenty of water, for cough and congestion.  Get adequate rest.   May use Afrin nasal spray (or generic oxymetazoline) twice daily for about 5 days and then discontinue.  Also recommend using saline nasal spray several times daily and saline nasal irrigation (AYR is a common brand).  Use Flonase nasal spray each morning after using Afrin nasal spray and saline nasal irrigation. Try warm salt water gargles for sore throat.  Stop all antihistamines for now, and other non-prescription cough/cold preparations. May take Delsym Cough Suppressant at bedtime for nighttime cough.  Increase Advair to twice daily for about two weeks, then resume once daily when improved. Continue albuterol inhaler as needed. Begin Amoxicillin if not improving about one week or if persistent fever develops (Given a prescription to hold, with an expiration date)  Follow-up with family doctor if not improving about10 days.      Final Clinical Impressions(s) /  UC Diagnoses   Final diagnoses:  Viral URI with cough  Bronchospasm, acute    New Prescriptions New Prescriptions   AMOXICILLIN (AMOXIL) 875 MG TABLET    Take 1 tablet (875 mg total) by mouth 2 (two) times daily. (Rx void after 11/20/16)     Kandra Nicolas, MD 12/02/16 1902

## 2016-11-12 NOTE — Discharge Instructions (Signed)
Take plain guaifenesin (1200mg  extended release tabs such as Mucinex) twice daily, with plenty of water, for cough and congestion.  Get adequate rest.   May use Afrin nasal spray (or generic oxymetazoline) twice daily for about 5 days and then discontinue.  Also recommend using saline nasal spray several times daily and saline nasal irrigation (AYR is a common brand).  Use Flonase nasal spray each morning after using Afrin nasal spray and saline nasal irrigation. Try warm salt water gargles for sore throat.  Stop all antihistamines for now, and other non-prescription cough/cold preparations. May take Delsym Cough Suppressant at bedtime for nighttime cough.  Increase Advair to twice daily for about two weeks, then resume once daily when improved. Continue albuterol inhaler as needed. Begin Amoxicillin if not improving about one week or if persistent fever develops   Follow-up with family doctor if not improving about10 days.

## 2016-11-13 ENCOUNTER — Ambulatory Visit: Payer: Medicare FFS | Admitting: Physician Assistant

## 2016-12-11 ENCOUNTER — Other Ambulatory Visit: Payer: Self-pay | Admitting: Physician Assistant

## 2016-12-18 ENCOUNTER — Other Ambulatory Visit: Payer: Self-pay | Admitting: Physician Assistant

## 2016-12-18 DIAGNOSIS — Z1239 Encounter for other screening for malignant neoplasm of breast: Secondary | ICD-10-CM

## 2017-01-07 ENCOUNTER — Ambulatory Visit (INDEPENDENT_AMBULATORY_CARE_PROVIDER_SITE_OTHER): Payer: Medicare FFS

## 2017-01-07 DIAGNOSIS — Z1231 Encounter for screening mammogram for malignant neoplasm of breast: Secondary | ICD-10-CM | POA: Diagnosis not present

## 2017-01-07 DIAGNOSIS — Z1239 Encounter for other screening for malignant neoplasm of breast: Secondary | ICD-10-CM

## 2017-02-25 ENCOUNTER — Other Ambulatory Visit: Payer: Self-pay | Admitting: *Deleted

## 2017-02-25 MED ORDER — AMBULATORY NON FORMULARY MEDICATION: 1 refills | Status: DC

## 2017-03-19 ENCOUNTER — Other Ambulatory Visit: Payer: Self-pay | Admitting: Physician Assistant

## 2017-04-22 ENCOUNTER — Encounter: Payer: Self-pay | Admitting: Physician Assistant

## 2017-04-22 ENCOUNTER — Ambulatory Visit (INDEPENDENT_AMBULATORY_CARE_PROVIDER_SITE_OTHER): Payer: Medicare FFS | Admitting: Physician Assistant

## 2017-04-22 VITALS — BP 138/80 | HR 79 | Ht 63.0 in | Wt 162.0 lb

## 2017-04-22 DIAGNOSIS — Z1211 Encounter for screening for malignant neoplasm of colon: Secondary | ICD-10-CM

## 2017-04-22 DIAGNOSIS — E118 Type 2 diabetes mellitus with unspecified complications: Secondary | ICD-10-CM | POA: Diagnosis not present

## 2017-04-22 DIAGNOSIS — I10 Essential (primary) hypertension: Secondary | ICD-10-CM

## 2017-04-22 DIAGNOSIS — E7800 Pure hypercholesterolemia, unspecified: Secondary | ICD-10-CM | POA: Insufficient documentation

## 2017-04-22 DIAGNOSIS — J4520 Mild intermittent asthma, uncomplicated: Secondary | ICD-10-CM

## 2017-04-22 LAB — COMPLETE METABOLIC PANEL WITH GFR
ALBUMIN: 3.6 g/dL (ref 3.6–5.1)
ALT: 14 U/L (ref 6–29)
AST: 19 U/L (ref 10–35)
Alkaline Phosphatase: 46 U/L (ref 33–130)
BILIRUBIN TOTAL: 0.3 mg/dL (ref 0.2–1.2)
BUN: 25 mg/dL (ref 7–25)
CO2: 26 mmol/L (ref 20–31)
Calcium: 9.5 mg/dL (ref 8.6–10.4)
Chloride: 102 mmol/L (ref 98–110)
Creat: 1.09 mg/dL — ABNORMAL HIGH (ref 0.60–0.93)
GFR, EST AFRICAN AMERICAN: 57 mL/min — AB (ref >=60)
GFR, EST NON AFRICAN AMERICAN: 50 mL/min — AB (ref >=60)
Glucose, Bld: 107 mg/dL — ABNORMAL HIGH (ref 65–99)
POTASSIUM: 4.1 mmol/L (ref 3.5–5.3)
Sodium: 139 mmol/L (ref 135–146)
TOTAL PROTEIN: 6.3 g/dL (ref 6.1–8.1)

## 2017-04-22 LAB — LIPID PANEL
CHOL/HDL RATIO: 5 ratio — AB (ref <5.0)
Cholesterol: 203 mg/dL — ABNORMAL HIGH (ref <200)
HDL: 41 mg/dL — AB (ref >50)
LDL Cholesterol: 87 mg/dL (ref <100)
TRIGLYCERIDES: 376 mg/dL — AB (ref <150)
VLDL: 75 mg/dL — ABNORMAL HIGH (ref <30)

## 2017-04-22 LAB — POCT GLYCOSYLATED HEMOGLOBIN (HGB A1C): HEMOGLOBIN A1C: 6.3

## 2017-04-22 MED ORDER — LISINOPRIL-HYDROCHLOROTHIAZIDE 20-25 MG PO TABS: ORAL_TABLET | ORAL | 1 refills | Status: DC

## 2017-04-22 MED ORDER — FLUTICASONE-SALMETEROL 250-50 MCG/DOSE IN AEPB: INHALATION_SPRAY | RESPIRATORY_TRACT | 3 refills | Status: DC

## 2017-04-22 MED ORDER — PRAVASTATIN SODIUM 40 MG PO TABS: 40.0000 mg | ORAL_TABLET | Freq: Every day | ORAL | 1 refills | Status: DC

## 2017-04-22 MED ORDER — METFORMIN HCL 500 MG PO TABS: 500.0000 mg | ORAL_TABLET | Freq: Every day | ORAL | 1 refills | Status: DC

## 2017-04-22 NOTE — Progress Notes (Signed)
Subjective:    Patient ID: Lindsey Fischer, female    DOB: 1941-04-23, 76 y.o.   MRN: [id number]  HPI Pt is a 76 yo female for 6 month follow up on DM. She is checking her fasting sugars and ranging from 96-115. She denies any hypoglycemia. She is on metformin only. She denies any open sores or wounds. She feels great. She works full time and stays active.   HTN- she is checking BP about twice a week and running 140's over 80's. Denies any SOB, CP, palpitations, headaches, or vision changes.   Asthma- doing great. No problems on advair. No recent exacerbations. Not using rescue inhaler.   .. Active Ambulatory Problems    Diagnosis Date Noted  . Dyslipidemia 01/19/2010  . GRIEF REACTION, ACUTE 03/27/2010  . ESSENTIAL HYPERTENSION, BENIGN 03/27/2010  . Asthma 01/19/2010  . IMPAIRED FASTING GLUCOSE 03/27/2010  . Metabolic syndrome X 75/08/2584  . DM (diabetes mellitus) ([place]) 11/15/2012  . Umbilical hernia 27/78/2423  . Diverticulosis 12/20/2015  . Gastritis and gastroduodenitis 12/20/2015  . Helicobacter pylori gastritis 12/20/2015  . Hyperlipidemia 04/22/2016  . New daily persistent headache 10/22/2016  . Pure hypercholesterolemia 04/22/2017   Resolved Ambulatory Problems    Diagnosis Date Noted  . Hypoxemia 05/22/2010  . Pneumonia 05/06/2011  . Epigastric abdominal tenderness with rebound tenderness 12/19/2015  . Non-intractable cyclical vomiting with nausea 12/19/2015  . Abdominal guarding 12/19/2015  . Diarrhea 12/19/2015  . Atypical chest pain 10/22/2016   Past Medical History:  Diagnosis Date  . Asthma   . Diabetes mellitus   . Hyperlipidemia   . Hypertension    .    Review of Systems  All other systems reviewed and are negative.      Objective:   Physical Exam  Constitutional: She is oriented to person, place, and time. She appears well-developed and well-nourished.  HENT:  Head: Normocephalic and atraumatic.  Cardiovascular: Normal rate, regular rhythm  and normal heart sounds.   Pulmonary/Chest: Effort normal and breath sounds normal.  Neurological: She is alert and oriented to person, place, and time.  Psychiatric: She has a normal mood and affect. Her behavior is normal.          Assessment & Plan:  Marland KitchenMarland KitchenDeserea was seen today for diabetes.  Diagnoses and all orders for this visit:  Type 2 diabetes mellitus with complication, without long-term current use of insulin (HCC) -     POCT HgB A1C -     metFORMIN (GLUCOPHAGE) 500 MG tablet; Take 1 tablet (500 mg total) by mouth daily with breakfast. -     COMPLETE METABOLIC PANEL WITH GFR  Essential hypertension, benign -     lisinopril-hydrochlorothiazide (PRINZIDE,ZESTORETIC) 20-25 MG tablet; take 1 tablet by mouth daily -     COMPLETE METABOLIC PANEL WITH GFR  Pure hypercholesterolemia -     pravastatin (PRAVACHOL) 40 MG tablet; Take 1 tablet (40 mg total) by mouth daily. -     Lipid panel  Colon cancer screening -     Ambulatory referral to Gastroenterology  Mild intermittent asthma without complication -     Fluticasone-Salmeterol (ADVAIR DISKUS) 250-50 MCG/DOSE AEPB; inhale 1 puff INTO THE LUNGS every 12 hours   .Marland Kitchen Lab Results  Component Value Date   HGBA1C 6.3 04/22/2017   a1c looks great.  Continue on metformin.  On ACE.  BP not to goal at 130/80. Will watch over next few months. Follow up in 3 months for BP check. Discussed DASH diet.  On STATIN.

## 2017-04-23 ENCOUNTER — Other Ambulatory Visit: Payer: Self-pay | Admitting: Physician Assistant

## 2017-04-23 ENCOUNTER — Encounter: Payer: Self-pay | Admitting: Physician Assistant

## 2017-04-23 DIAGNOSIS — I10 Essential (primary) hypertension: Secondary | ICD-10-CM

## 2017-04-23 DIAGNOSIS — R7989 Other specified abnormal findings of blood chemistry: Secondary | ICD-10-CM | POA: Insufficient documentation

## 2017-04-23 NOTE — Progress Notes (Signed)
Call pt:  Cholesterol is improving. LDL better.  TG went up some. Are you taking fish oil?  Kidney function is up indicating if stays this way is chronic kidney disease. Are you taking NSAIDs OTC? If so stop. Stay hydrated and recheck in 6 weeks.

## 2017-04-29 ENCOUNTER — Other Ambulatory Visit: Payer: Self-pay | Admitting: Physician Assistant

## 2017-05-22 ENCOUNTER — Encounter: Payer: Self-pay | Admitting: Physician Assistant

## 2017-05-27 ENCOUNTER — Encounter: Payer: Self-pay | Admitting: Physician Assistant

## 2017-05-27 DIAGNOSIS — H18603 Keratoconus, unspecified, bilateral: Secondary | ICD-10-CM

## 2017-05-27 DIAGNOSIS — H43813 Vitreous degeneration, bilateral: Secondary | ICD-10-CM | POA: Insufficient documentation

## 2017-05-27 DIAGNOSIS — H25813 Combined forms of age-related cataract, bilateral: Secondary | ICD-10-CM

## 2017-05-27 HISTORY — DX: Combined forms of age-related cataract, bilateral: H25.813

## 2017-05-27 HISTORY — DX: Keratoconus, unspecified, bilateral: H18.603

## 2017-05-27 HISTORY — DX: Vitreous degeneration, bilateral: H43.813

## 2017-06-02 ENCOUNTER — Ambulatory Visit (INDEPENDENT_AMBULATORY_CARE_PROVIDER_SITE_OTHER): Payer: Medicare FFS | Admitting: Family Medicine

## 2017-06-02 ENCOUNTER — Encounter: Payer: Self-pay | Admitting: Family Medicine

## 2017-06-02 VITALS — BP 144/57 | HR 69 | Ht 63.0 in | Wt 160.0 lb

## 2017-06-02 DIAGNOSIS — R7989 Other specified abnormal findings of blood chemistry: Secondary | ICD-10-CM | POA: Diagnosis not present

## 2017-06-02 DIAGNOSIS — E118 Type 2 diabetes mellitus with unspecified complications: Secondary | ICD-10-CM

## 2017-06-02 LAB — POCT UA - MICROALBUMIN
Creatinine, POC: 50 mg/dL
Microalbumin Ur, POC: 10 mg/L

## 2017-06-02 NOTE — Progress Notes (Signed)
   Subjective:    Patient ID: Lindsey Fischer, female    DOB: Aug 20, 1941, 76 y.o.   MRN: [id number]  HPI 76 year old diabetic female comes in today to follow-up on recent elevated renal function. She did a CMP on June 12. Her creatinine was 1.09 and BUN was 25. Previous creatinine from the year before was 0.93. Her GFR calculates to approximately 50. She was encouraged to stop all NSAIDs and then come back in 6 weeks to recheck.   Diabetes-currently on an ACE inhibitor and a statin. Well-controlled on metformin. Lab Results  Component Value Date   HGBA1C 6.3 04/22/2017     Review of Systems     Objective:   Physical Exam  Constitutional: She is oriented to person, place, and time. She appears well-developed and well-nourished.  HENT:  Head: Normocephalic and atraumatic.  Eyes: Conjunctivae and EOM are normal.  Cardiovascular: Normal rate.   Pulmonary/Chest: Effort normal.  Neurological: She is alert and oriented to person, place, and time.  Skin: Skin is dry. No pallor.  Psychiatric: She has a normal mood and affect. Her behavior is normal.  Vitals reviewed.       Assessment & Plan:  Elevated renal function-I reviewed her results with her. Her diabetes over all is well controlled. We'll get a urine microalbumin today. It did show a small amount of protein in the urine. We'll recheck creatinine and BUN today and call with results once available. If the creatinine is stable then she technically we'll have CKD 3 and will plan to recheck renal function in 3 months just to make sure that it's not increasing.. If it is back to baseline then will monitor periodically. Gave her a copy of the lab work and went over in detail with her today. On an ACEi.   Diabetes-recommend keep follow-up [name] in September. She is on an ACE inhibitor already.  Colon cancer screening-also discussed Colocort with her as an options that she does typically do yearly stool cards. She declined today and says she  has to much going on and wants to wait until she sees [name] at the next office visit.

## 2017-06-03 ENCOUNTER — Encounter: Payer: Self-pay | Admitting: Family Medicine

## 2017-06-03 DIAGNOSIS — N183 Chronic kidney disease, stage 3 unspecified: Secondary | ICD-10-CM | POA: Insufficient documentation

## 2017-06-03 LAB — BASIC METABOLIC PANEL WITH GFR
BUN: 16 mg/dL (ref 7–25)
CO2: 26 mmol/L (ref 20–31)
CREATININE: 0.97 mg/dL — AB (ref 0.60–0.93)
Calcium: 9.2 mg/dL (ref 8.6–10.4)
Chloride: 104 mmol/L (ref 98–110)
GFR, EST AFRICAN AMERICAN: 66 mL/min (ref >=60)
GFR, Est Non African American: 57 mL/min — ABNORMAL LOW (ref >=60)
Glucose, Bld: 114 mg/dL — ABNORMAL HIGH (ref 65–99)
Potassium: 4.3 mmol/L (ref 3.5–5.3)
SODIUM: 139 mmol/L (ref 135–146)

## 2017-06-18 LAB — HM DIABETES EYE EXAM

## 2017-07-16 ENCOUNTER — Encounter: Payer: Self-pay | Admitting: Physician Assistant

## 2017-07-23 ENCOUNTER — Ambulatory Visit (INDEPENDENT_AMBULATORY_CARE_PROVIDER_SITE_OTHER): Payer: Medicare FFS | Admitting: Physician Assistant

## 2017-07-23 ENCOUNTER — Encounter: Payer: Self-pay | Admitting: Physician Assistant

## 2017-07-23 VITALS — BP 126/49 | HR 66 | Wt 157.0 lb

## 2017-07-23 DIAGNOSIS — G4452 New daily persistent headache (NDPH): Secondary | ICD-10-CM | POA: Diagnosis not present

## 2017-07-23 DIAGNOSIS — E118 Type 2 diabetes mellitus with unspecified complications: Secondary | ICD-10-CM | POA: Diagnosis not present

## 2017-07-23 DIAGNOSIS — I10 Essential (primary) hypertension: Secondary | ICD-10-CM | POA: Diagnosis not present

## 2017-07-23 LAB — POCT GLYCOSYLATED HEMOGLOBIN (HGB A1C): Hemoglobin A1C: 6.1

## 2017-07-23 MED ORDER — METFORMIN HCL 500 MG PO TABS: 500.0000 mg | ORAL_TABLET | Freq: Every day | ORAL | 1 refills | Status: DC

## 2017-07-23 MED ORDER — BACLOFEN 10 MG PO TABS: ORAL_TABLET | ORAL | 0 refills | Status: DC

## 2017-07-23 NOTE — Progress Notes (Signed)
Subjective:    Patient ID: Lindsey Fischer, female    DOB: 1941-01-31, 76 y.o.   MRN: [id number]  HPI  Pt is a 76 yo female who presents to the clinic for 3 month follow up.   DM-not checking sugars. On metformin. Denies hypoglycemia. No open sores or wounds.   New headache- for the last 6 months she has had a headache that starts on the right side of neck and radiates into her right temple. Denies any auras, n/v, no vision changes, limb weakness, decreased hand grip, radiation of pain down arms.   .. Active Ambulatory Problems    Diagnosis Date Noted  . Dyslipidemia 01/19/2010  . GRIEF REACTION, ACUTE 03/27/2010  . ESSENTIAL HYPERTENSION, BENIGN 03/27/2010  . Asthma 01/19/2010  . Metabolic syndrome X 67/89/3810  . DM (diabetes mellitus) ([place]) 11/15/2012  . Umbilical hernia 17/51/0258  . Diverticulosis 12/20/2015  . Gastritis and gastroduodenitis 12/20/2015  . Helicobacter pylori gastritis 12/20/2015  . Hyperlipidemia 04/22/2016  . New daily persistent headache 10/22/2016  . Pure hypercholesterolemia 04/22/2017  . Elevated serum creatinine 04/23/2017  . Combined forms of age-related cataract of both eyes 05/27/2017  . 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 Posterior vitreous detachment of both eyes 05/27/2017  . CKD stage G3a/A2, GFR 45-59 and albumin creatinine ratio 30-299 mg/g 06/03/2017   Resolved Ambulatory Problems    Diagnosis Date Noted  . Impaired fasting glucose 03/27/2010  . Hypoxemia 05/22/2010  . Pneumonia 05/06/2011  . Epigastric abdominal tenderness with rebound tenderness 12/19/2015  . Non-intractable cyclical vomiting with nausea 12/19/2015  . Abdominal guarding 12/19/2015  . Diarrhea 12/19/2015  . Atypical chest pain 10/22/2016   Past Medical History:  Diagnosis Date  . Asthma   . Combined forms of age-related cataract of both eyes 05/27/2017  . Diabetes mellitus   . Hyperlipidemia   . Hypertension   . 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  Posterior vitreous detachment of both eyes 05/27/2017        Review of Systems  All other systems reviewed and are negative.      Objective:   Physical Exam  Constitutional: She is oriented to person, place, and time. She appears well-developed and well-nourished.  HENT:  Head: Normocephalic and atraumatic.  Cardiovascular: Normal rate, regular rhythm and normal heart sounds.   Pulmonary/Chest: Effort normal and breath sounds normal.  Musculoskeletal: Normal range of motion.  NROM of neck.  Tightness of bilateral Paraspinous muscles of cervical region.  No tenderness over c-spine.  Normal ROM of shoulders.  No tenderness over temples.   Neurological: She is alert and oriented to person, place, and time.  Psychiatric: She has a normal mood and affect. Her behavior is normal.          Assessment & Plan:  Marland KitchenMarland KitchenFina was seen today for hypertension and diabetes.  Diagnoses and all orders for this visit:  Type 2 diabetes mellitus with complication, without long-term current use of insulin (HCC) -     POCT HgB A1C -     metFORMIN (GLUCOPHAGE) 500 MG tablet; Take 1 tablet (500 mg total) by mouth daily with breakfast.  Essential hypertension, benign  New daily persistent headache -     baclofen (LIORESAL) 10 MG tablet; Take 1/2 to 1 tablet up to twice a day.    A!C was 6.1   Controlled.  On ACE.  On STATIN.  Continue metoformin.  Eye exam up to date.  Vaccines up to date.  BP  controlled.   Unclear etiology of headache. No temple tenderness. Seems to be orginating in neck. Discussed neck ROM, heat, biofreeze try baclofen at bedtime. If not improving in next few weeks follow up. No red flag symptoms.

## 2017-07-25 ENCOUNTER — Encounter: Payer: Self-pay | Admitting: Physician Assistant

## 2017-07-25 ENCOUNTER — Telehealth: Payer: Self-pay | Admitting: Physician Assistant

## 2017-07-25 NOTE — Telephone Encounter (Signed)
a1c results need to be entered.

## 2017-07-31 NOTE — Telephone Encounter (Signed)
Done

## 2017-07-31 NOTE — Addendum Note (Signed)
Addended by: Terance Hart on: 07/31/2017 10:48 AM   Modules accepted: Orders

## 2017-08-20 ENCOUNTER — Encounter: Payer: Self-pay | Admitting: Physician Assistant

## 2017-08-20 ENCOUNTER — Ambulatory Visit (INDEPENDENT_AMBULATORY_CARE_PROVIDER_SITE_OTHER): Payer: Medicare FFS | Admitting: Physician Assistant

## 2017-08-20 VITALS — BP 141/55 | HR 72 | Ht 63.0 in | Wt 157.0 lb

## 2017-08-20 DIAGNOSIS — G4489 Other headache syndrome: Secondary | ICD-10-CM | POA: Diagnosis not present

## 2017-08-20 DIAGNOSIS — M436 Torticollis: Secondary | ICD-10-CM | POA: Diagnosis not present

## 2017-08-20 DIAGNOSIS — M62838 Other muscle spasm: Secondary | ICD-10-CM

## 2017-08-20 DIAGNOSIS — G4452 New daily persistent headache (NDPH): Secondary | ICD-10-CM

## 2017-08-20 DIAGNOSIS — R200 Anesthesia of skin: Secondary | ICD-10-CM | POA: Insufficient documentation

## 2017-08-20 MED ORDER — DICLOFENAC SODIUM 1 % TD GEL: 4.0000 g | Freq: Four times a day (QID) | TRANSDERMAL | 1 refills | Status: DC

## 2017-08-20 NOTE — Progress Notes (Signed)
Subjective:    Patient ID: Lindsey Fischer, female    DOB: 12/16/1940, 76 y.o.   MRN: [id number]  HPI  Pt is a 76 yo female who presents to the clinic for follow up on right sided headaches for a little over 6 months she has had a headache that starts on the right side of neck and radiates into her right temple. Denies any auras, n/v, no vision changes, limb weakness, decreased hand grip, radiation of pain down arms. She does report some numbness over the right parietal region of scalp at times. She denies any light, sound, smell sensitivity.   .. Active Ambulatory Problems    Diagnosis Date Noted  . Dyslipidemia 01/19/2010  . GRIEF REACTION, ACUTE 03/27/2010  . ESSENTIAL HYPERTENSION, BENIGN 03/27/2010  . Asthma 01/19/2010  . Metabolic syndrome X 37/16/9678  . DM (diabetes mellitus) ([place]) 11/15/2012  . Umbilical hernia 93/81/0175  . Diverticulosis 12/20/2015  . Gastritis and gastroduodenitis 12/20/2015  . Helicobacter pylori gastritis 12/20/2015  . Hyperlipidemia 04/22/2016  . New daily persistent headache 10/22/2016  . Pure hypercholesterolemia 04/22/2017  . Elevated serum creatinine 04/23/2017  . Combined forms of age-related cataract of both eyes 05/27/2017  . 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 Posterior vitreous detachment of both eyes 05/27/2017  . 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 Neck muscle spasm 08/20/2017  . Neck stiffness 08/20/2017  . Numbness 08/20/2017  . Other headache syndrome 08/20/2017   Resolved Ambulatory Problems    Diagnosis Date Noted  . Impaired fasting glucose 03/27/2010  . Hypoxemia 05/22/2010  . Pneumonia 05/06/2011  . Epigastric abdominal tenderness with rebound tenderness 12/19/2015  . Non-intractable cyclical vomiting with nausea 12/19/2015  . Abdominal guarding 12/19/2015  . Diarrhea 12/19/2015  . Atypical chest pain 10/22/2016   Past Medical History:  Diagnosis Date  . Asthma    . Combined forms of age-related cataract of both eyes 05/27/2017  . Diabetes mellitus   . Hyperlipidemia   . Hypertension   . 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 Posterior vitreous detachment of both eyes 05/27/2017   .Marland Kitchen Family History  Problem Relation Age of Onset  . Diabetes Mother   . Heart attack Father 75       deceased  . Cancer Sister        brain tumor  . Cancer Brother        prostrate and lung CA  . COPD Sister      Review of Systems  All other systems reviewed and are negative.      Objective:   Physical Exam  Constitutional: She is oriented to person, place, and time. She appears well-developed and well-nourished.  HENT:  Head: Normocephalic and atraumatic.  Mouth/Throat: Oropharynx is clear and moist.  Uvula midline.   Eyes: Pupils are equal, round, and reactive to light. EOM are normal.  Cardiovascular: Normal rate, regular rhythm and normal heart sounds.   Pulmonary/Chest: Effort normal and breath sounds normal.  Musculoskeletal:  Loss of ROM due to pain able to turn to the left 45 degrees and to the right 55 degrees. No pain to palpation over the cervical spine.  Very tight paraspinal mucles of upper back and neck.  Negative spurlings.   Upper extremity strength 5/5.   Lymphadenopathy:    She has no cervical adenopathy.  Neurological: She is alert and oriented to person, place, and time. No cranial nerve deficit. Coordination normal.  Intact  rapid alternating movements.  Gait intact.    Psychiatric: She has a normal mood and affect. Her behavior is normal.          Assessment & Plan:  Marland KitchenMarland KitchenJamese was seen today for headache.  Diagnoses and all orders for this visit:  New daily persistent headache -     diclofenac sodium (VOLTAREN) 1 % GEL; Apply 4 g topically 4 (four) times daily. -     Ambulatory referral to Physical Therapy -     MR BRAIN WO CONTRAST; Future -     MR BRAIN WO CONTRAST  Neck muscle spasm -     diclofenac sodium  (VOLTAREN) 1 % GEL; Apply 4 g topically 4 (four) times daily. -     Ambulatory referral to Physical Therapy  Neck stiffness -     diclofenac sodium (VOLTAREN) 1 % GEL; Apply 4 g topically 4 (four) times daily. -     Ambulatory referral to Physical Therapy  Other headache syndrome -     diclofenac sodium (VOLTAREN) 1 % GEL; Apply 4 g topically 4 (four) times daily.  Numbness  Other orders -     Cancel: MR MRA HEAD WO CONTRAST; Future   Pt had episode of elevated serum creatine. Will avoid NSAIDs. Diclofenac given to use over neck. I think she will benefit from PT. Discussed heat and massage.   Due to numbness and persistent headache will get MRI of head to evaluate persistent headache. Pt has not had any neurological findings except for numbness of parietal head on right side.

## 2017-08-20 NOTE — Patient Instructions (Signed)
Will get MRI.  Will get set up for PT.  Try diclofenac with baclofen.  Follow up after PT.

## 2017-09-01 ENCOUNTER — Ambulatory Visit (INDEPENDENT_AMBULATORY_CARE_PROVIDER_SITE_OTHER): Payer: Medicare FFS

## 2017-09-01 DIAGNOSIS — G4452 New daily persistent headache (NDPH): Secondary | ICD-10-CM | POA: Diagnosis not present

## 2017-09-01 DIAGNOSIS — R200 Anesthesia of skin: Secondary | ICD-10-CM | POA: Diagnosis not present

## 2017-09-09 ENCOUNTER — Ambulatory Visit (INDEPENDENT_AMBULATORY_CARE_PROVIDER_SITE_OTHER): Payer: Medicare FFS | Admitting: Rehabilitative and Restorative Service Providers"

## 2017-09-09 ENCOUNTER — Encounter: Payer: Self-pay | Admitting: Rehabilitative and Restorative Service Providers"

## 2017-09-09 DIAGNOSIS — G4489 Other headache syndrome: Secondary | ICD-10-CM | POA: Diagnosis present

## 2017-09-09 DIAGNOSIS — M542 Cervicalgia: Secondary | ICD-10-CM

## 2017-09-09 DIAGNOSIS — R29898 Other symptoms and signs involving the musculoskeletal system: Secondary | ICD-10-CM | POA: Diagnosis not present

## 2017-09-09 DIAGNOSIS — R293 Abnormal posture: Secondary | ICD-10-CM | POA: Diagnosis not present

## 2017-09-09 NOTE — Therapy (Signed)
[place], [place], [address] Phone: [phone]   Fax:  [phone]  Physical Therapy Evaluation  Patient Details  Name: Lindsey Fischer MRN: [id number] Date of Birth: September 24, 1941 Referring Provider: Iran Planas, PA-C  Encounter Date: 09/09/2017      PT End of Session - 09/09/17 1248    Visit Number 1   Number of Visits 12   Date for PT Re-Evaluation 10/21/17   PT Start Time 1102   PT Stop Time 1200   PT Time Calculation (min) 58 min   Activity Tolerance Patient tolerated treatment well      Past Medical History:  Diagnosis Date  . Asthma   . Combined forms of age-related cataract of both eyes 05/27/2017  . Diabetes mellitus    pre diabetes  . Hyperlipidemia   . Hypertension   . 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 Posterior vitreous detachment of both eyes 05/27/2017    Past Surgical History:  Procedure Laterality Date  . APPENDECTOMY    . CATARACT EXTRACTION Right   . OVARIAN CYST REMOVAL      There were no vitals filed for this visit.       Subjective Assessment - 09/09/17 1107    Subjective Patient reports gradual onset of neck pain and headache in the past 6 months. She has no known injury or cause of pain. She has severe pain in the neck. She has persistent pain which improves some with meds but does not fully resolve. Symptoms are present daily.    Pertinent History HTN; AODM   Diagnostic tests MRI (-)   Patient Stated Goals get rid of pain if at all possible - tired of the pain    Currently in Pain? Yes   Pain Score 6    Pain Location Head   Pain Orientation Right   Pain Descriptors / Indicators Headache;Squeezing;Tightness   Pain Type Chronic pain   Pain Radiating Towards back of neck and up the side of her head    Pain Onset More than a month ago   Pain Frequency Constant   Aggravating Factors  unknown    Pain Relieving Factors medicine helps some; cream; hot  towels             [facility] PT Assessment - 09/09/17 0001      Assessment   Medical Diagnosis Headache; cervical pain    Referring Provider Iran Planas, PA-C   Onset Date/Surgical Date 02/09/17   Hand Dominance Right   Next MD Visit PRN    Prior Therapy none     Precautions   Precautions None     Balance Screen   Has the patient fallen in the past 6 months No   Has the patient had a decrease in activity level because of a fear of falling?  No   Is the patient reluctant to leave their home because of a fear of falling?  No     Home Environment   Additional Comments lives alone      Prior Function   Level of Independence Independent   Vocation Full time employment   Vocation Requirements [facility] assisted [facility] - some lifting but most patients are mobile    Leisure household chores; yard work      Observation/Other Assessments   Focus on Therapeutic Outcomes (FOTO)  46% limitation      Sensation   Additional Comments WFL's per pt report  Posture/Postural Control   Posture Comments head forward; shoudlers rounded and elevated; head of the humerus anterior in orientatin; scapulae abducted and rotated along the thoracic wall      AROM   Right/Left Shoulder --  end range tightness Lt > Rt    Cervical Flexion 46  stretching pain midline   Cervical Extension 28  stretching pain in the front of neck and to the RT   Cervical - Right Side Bend 15  pain Rt > Lt    Cervical - Left Side Bend 19  pain Rt   Cervical - Right Rotation 24  pain bilat Rt > Lt    Cervical - Left Rotation 20  pain Rt > Lt      Strength   Overall Strength Comments bilat UE's grossly 5/5 throughtout      Palpation   Spinal mobility hypomobile upper thoracic and cervical spine with CPA and lateral mobs    Palpation comment significant muscular tightness through ant/lat/post cervical musculature; pecs; upper traps; leveator Rt > Lt             Objective measurements  completed on examination: See above findings.          [facility] Adult PT Treatment/Exercise - 09/09/17 0001      Therapeutic Activites    Therapeutic Activities --  myofacial ball release work standing     Neuro Re-ed    Neuro Re-ed Details  postural education and correction initiated      Neck Exercises: Standing   Neck Retraction --  axial extension 5 sec x 5 to pt tolerance      Neck Exercises: Supine   Neck Retraction 5 reps;5 secs  head supported on pillow    Other Supine Exercise trial of occipital inhibition with massage blocks ~1 min      Shoulder Exercises: Standing   Other Standing Exercises chest lift with scap squeeze 2-3 sec x 5 with and without noodle - to pt tolerance      Moist Heat Therapy   Number Minutes Moist Heat 20 Minutes   Moist Heat Location Cervical  thoracic bilat     Electrical Stimulation   Electrical Stimulation Location bilat upper cervical; Rt upper trap/leveator    Electrical Stimulation Action IFC   Electrical Stimulation Parameters to tolerance   Electrical Stimulation Goals Tone;Pain                PT Education - 09/09/17 1149    Education provided Yes   Education Details HEP TENS unit    Person(s) Educated Patient   Methods Explanation;Demonstration;Tactile cues;Verbal cues;Handout   Comprehension Verbalized understanding;Verbal cues required;Tactile cues required;Returned demonstration             PT Long Term Goals - 09/09/17 1255      PT LONG TERM GOAL #1   Title Improve posture and alignment with patient to demonstrate improved upright posture engaging posterior shoudler girdle musculature 10/21/17   Time 6   Period Weeks   Status New     PT LONG TERM GOAL #2   Title Increase cervical moblity and ROM by 5-10 deg in all planes with pain free motion 10/21/17   Time 6   Period Weeks   Status New     PT LONG TERM GOAL #3   Title Decrease frequency, intensity and duration of headaches and cervical pain by  50-70% per pt report 10/21/17   Time 6   Period Weeks   Status New  PT LONG TERM GOAL #4   Title Independent in HEP 10/21/17   Time 6   Period Weeks   Status New     PT LONG TERM GOAL #5   Title Improve FOTO to </= 40% limitation 10/21/17   Time 6   Period Weeks   Status New                Plan - 2017-09-29 1249    Clinical Impression Statement Lindsey Fischer presents with chronic Rt sided cervical pain and headache. Symptoms have gradually increased in frequency and intensity over the past 6 months with no known injury of cause. She now has constant daily headaches and neck pain. Lindsey Fischer presents with poor posture and alignment with increased thoracic kyphosis and head forward posture. She has poor spinal mobiilty in PA and lateral planes, decreased cervical ROM, muscular tightness to palpation, pain on a constant basis of variable intensity. Patient will benefit from PT to address problems identified.    Clinical Presentation Evolving   Clinical Presentation due to: chronic symptoms    Clinical Decision Making Low   Rehab Potential Good   PT Frequency 2x / week   PT Duration 6 weeks   PT Treatment/Interventions Patient/family education;ADLs/Self Care Home Management;Cryotherapy;Electrical Stimulation;Iontophoresis 4mg /ml Dexamethasone;Moist Heat;Traction;Ultrasound;Dry needling;Manual techniques;Therapeutic activities;Therapeutic exercise;Neuromuscular re-education   PT Next Visit Plan manual work - thoracic and cervical mobs; deep tissue work through [place] upper quarter; manual traction and stretching to tolerance; postural correction; modlaities as indicated; possibly DN    Consulted and Agree with Plan of Care Patient      Patient will benefit from skilled therapeutic intervention in order to improve the following deficits and impairments:  Postural dysfunction, Improper body mechanics, Pain, Decreased range of motion, Decreased mobility, Increased fascial restricitons, Increased  muscle spasms, Decreased activity tolerance  Visit Diagnosis: Other headache syndrome - Plan: PT plan of care cert/re-cert  Cervicalgia - Plan: PT plan of care cert/re-cert  Other symptoms and signs involving the musculoskeletal system - Plan: PT plan of care cert/re-cert  Abnormal posture - Plan: PT plan of care cert/re-cert      [facility] PT PB G-CODES - 09-29-17 1259    Functional Assessment Tool Used  Evaluation; FOTO; clinical assessment    Functional Limitations Changing and maintaining body position   Changing and Maintaining Body Position Current Status (X9371) At least 40 percent but less than 60 percent impaired, limited or restricted   Changing and Maintaining Body Position Goal Status (I9678) At least 20 percent but less than 40 percent impaired, limited or restricted       Problem List Patient Active Problem List   Diagnosis Date Noted  . Neck muscle spasm 08/20/2017  . Neck stiffness 08/20/2017  . Numbness 08/20/2017  . Other headache syndrome 08/20/2017  . CKD stage G3a/A2, GFR 45-59 and albumin creatinine ratio 30-299 mg/g ([place]) 06/03/2017  . Combined forms of age-related cataract of both eyes 05/27/2017  . 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 Posterior vitreous detachment of both eyes 05/27/2017  . Elevated serum creatinine 04/23/2017  . Pure hypercholesterolemia 04/22/2017  . New daily persistent headache 10/22/2016  . Hyperlipidemia 04/22/2016  . Diverticulosis 12/20/2015  . Gastritis and gastroduodenitis 12/20/2015  . Helicobacter pylori gastritis 12/20/2015  . Umbilical hernia 93/81/0175  . DM (diabetes mellitus) ([place]) 11/15/2012  . Metabolic syndrome X 09/04/8526  . GRIEF REACTION, ACUTE 03/27/2010  . ESSENTIAL HYPERTENSION, BENIGN 03/27/2010  . Dyslipidemia 01/19/2010  . Asthma 01/19/2010    Billal Rollo  Nilda Simmer PT, MPH  09/09/2017, 1:02 PM  [facility] [place], [place],  [address] Phone: [phone]   Fax:  [phone]  Name: Lindsey Fischer MRN: [id number] Date of Birth: 11/25/1940

## 2017-09-09 NOTE — Patient Instructions (Addendum)
Self massage with ~3 inch plastic ball.  Lying on pillow in comfortable position - 2 golf balls one on either side of the spine - stay for 1-2 min as tolerated   Axial Extension (Chin Tuck)    Pull chin in and lengthen back of neck. Hold __5__ seconds while counting out loud. Repeat __10__ times. Do __several__ sessions per day. Sitting, standing and lying down at tolerated   Shoulder Blade Squeeze    Rotate shoulders back, then squeeze shoulder blades down and back. Hold 10 sec Repeat __5-10__ times. Do __several __ sessions per day. Working on posture and alignment throughout the day  Lift chest   TENS UNIT: This is helpful for muscle pain and spasm.   Search and Purchase a TENS 7000 2nd edition at www.tenspros.com. It should be less than $30.     TENS unit instructions: Do not shower or bathe with the unit on Turn the unit off before removing electrodes or batteries If the electrodes lose stickiness add a drop of water to the electrodes after they are disconnected from the unit and place on plastic sheet. If you continued to have difficulty, call the TENS unit company to purchase more electrodes. Do not apply lotion on the skin area prior to use. Make sure the skin is clean and dry as this will help prolong the life of the electrodes. After use, always check skin for unusual red areas, rash or other skin difficulties. If there are any skin problems, does not apply electrodes to the same area. Never remove the electrodes from the unit by pulling the wires. Do not use the TENS unit or electrodes other than as directed. Do not change electrode placement without consultating your therapist or physician. Keep 2 fingers with between each electrode.

## 2017-09-12 ENCOUNTER — Ambulatory Visit (INDEPENDENT_AMBULATORY_CARE_PROVIDER_SITE_OTHER): Payer: Medicare FFS | Admitting: Rehabilitative and Restorative Service Providers"

## 2017-09-12 ENCOUNTER — Encounter: Payer: Self-pay | Admitting: Rehabilitative and Restorative Service Providers"

## 2017-09-12 DIAGNOSIS — M542 Cervicalgia: Secondary | ICD-10-CM | POA: Diagnosis not present

## 2017-09-12 DIAGNOSIS — R29898 Other symptoms and signs involving the musculoskeletal system: Secondary | ICD-10-CM | POA: Diagnosis not present

## 2017-09-12 DIAGNOSIS — G4489 Other headache syndrome: Secondary | ICD-10-CM

## 2017-09-12 DIAGNOSIS — R293 Abnormal posture: Secondary | ICD-10-CM

## 2017-09-12 NOTE — Patient Instructions (Signed)
Axial Extension (Chin Tuck)    Pull chin in and lengthen back of neck. Hold __5__ seconds while counting out loud. Repeat __10__ times. Do __several__ sessions per day.  Shoulder Blade Squeeze    Rotate shoulders back, then squeeze shoulder blades together. Repeat ____ times. Do ____ sessions per day.  Upper Back Strength: Lower Trapezius / Rotator Cuff " L's "     Arms in waitress pose, palms up. Press hands back and slide shoulder blades down. Hold for __5__ seconds. Repeat _10___ times. 1-2 times per day.    Scapular Retraction: Elbow Flexion (Standing)  "W's"     With elbows bent to 90, pinch shoulder blades together and rotate arms out, keeping elbows bent. Repeat __10__ times per set. Do __1-2__ sets per session. Do _several ___ sessions per day.  Scapula Adduction With Pectoralis Stretch: Low - Standing   Shoulders at 45 hands even with shoulders, keeping weight through legs, shift weight forward until you feel pull or stretch through the front of your chest. Hold _30__ seconds. Do _3__ times, _2-4__ times per day.   Scapula Adduction With Pectoralis Stretch: Mid-Range - Standing   Shoulders at 90 elbows even with shoulders, keeping weight through legs, shift weight forward until you feel pull or strength through the front of your chest. Hold __30_ seconds. Do _3__ times, __2-4_ times per day.   Scapula Adduction With Pectoralis Stretch: High - Standing   Shoulders at 120 hands up high on the doorway, keeping weight on feet, shift weight forward until you feel pull or stretch through the front of your chest. Hold _30__ seconds. Do _3__ times, _2-3__ times per day.   [facility] at [facility] [place], [place] [address]  [phone] (office) [phone] (fax)

## 2017-09-12 NOTE — Therapy (Signed)
[place] [address] [place], [place], [address] Phone: [phone]   Fax:  [phone]  Physical Therapy Treatment  Patient Details  Name: Lindsey Fischer MRN: [id number] Date of Birth: Nov 05, 1941 Referring Provider: Iran Planas, PA-C  Encounter Date: 09/12/2017      PT End of Session - 09/12/17 1024    Visit Number 2   Number of Visits 12   Date for PT Re-Evaluation 10/21/17   PT Start Time 5277   PT Stop Time 1112   PT Time Calculation (min) 54 min      Past Medical History:  Diagnosis Date  . Asthma   . Combined forms of age-related cataract of both eyes 05/27/2017  . Diabetes mellitus    pre diabetes  . Hyperlipidemia   . Hypertension   . 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 Posterior vitreous detachment of both eyes 05/27/2017    Past Surgical History:  Procedure Laterality Date  . APPENDECTOMY    . CATARACT EXTRACTION Right   . OVARIAN CYST REMOVAL      There were no vitals filed for this visit.      Subjective Assessment - 09/12/17 1043    Subjective Feeling an improvement - pleased with progress. Pain in the neck and head hav e not been as bad. Can tell she is making progress.    Currently in Pain? Yes   Pain Score 2    Pain Location Head   Pain Orientation Right   Pain Descriptors / Indicators Headache;Tightness;Squeezing   Pain Type Chronic pain   Pain Onset More than a month ago   Pain Frequency Constant                         [facility] Adult PT Treatment/Exercise - 09/12/17 0001      Neck Exercises: Standing   Neck Retraction --  axial extension 5 sec x 5 to pt tolerance      Neck Exercises: Supine   Neck Retraction 5 reps;5 secs  head supported on pillow      Shoulder Exercises: Standing   Other Standing Exercises chest lift with scap squeeze 2-3 sec x 5 with and without noodle - to pt tolerance    Other Standing Exercises L'sand W's x 10 each with swim  noodle      Shoulder Exercises: Stretch   Other Shoulder Stretches 3 way doorway stretch 30 sec x 2 each    Other Shoulder Stretches supine prolonged snow angel ~ 1 min      Moist Heat Therapy   Number Minutes Moist Heat 20 Minutes   Moist Heat Location Cervical  thoracic bilat     Electrical Stimulation   Electrical Stimulation Location Rt upper cervical; Rt upper trap/leveator    Electrical Stimulation Action IFC   Electrical Stimulation Parameters to tolerance   Electrical Stimulation Goals Tone;Pain     Ultrasound   Ultrasound Location Rt cervical to occipital    Ultrasound Parameters 1.5 w/cm2; 1 mHz; 100%; 8 min    Ultrasound Goals Pain;Other (Comment)  muscular tightness      Manual Therapy   Manual Therapy --  scap retraction sitting w/noodle PT pulliing shds back    Manual therapy comments pt sitting and supine    Joint Mobilization CPA mobs; lateral mobs upper thoracic and cervical spine - tight mid cervical Lt    Soft tissue mobilization deep tissue work through anterior/lateral/posterior cervical musculature;  upper trap; leveator; pecs    Myofascial Release lateral cervical to upper trap on Rt    Manual Traction Rt UE pull through long arm to release tightness through the upper traps                PT Education - 09/12/17 1042    Education provided Yes   Education Details HEP    Person(s) Educated Patient   Methods Explanation;Demonstration;Tactile cues;Verbal cues;Handout   Comprehension Verbalized understanding;Returned demonstration;Verbal cues required;Tactile cues required;Need further instruction             PT Long Term Goals - 09/12/17 1025      PT LONG TERM GOAL #1   Title Improve posture and alignment with patient to demonstrate improved upright posture engaging posterior shoudler girdle musculature 10/21/17   Time 6   Period Weeks   Status On-going     PT LONG TERM GOAL #2   Title Increase cervical moblity and ROM by 5-10 deg in  all planes with pain free motion 10/21/17   Time 6   Period Weeks   Status On-going     PT LONG TERM GOAL #3   Title Decrease frequency, intensity and duration of headaches and cervical pain by 50-70% per pt report 10/21/17   Time 6   Period Weeks   Status On-going     PT LONG TERM GOAL #4   Title Independent in HEP 10/21/17   Time 6   Period Weeks   Status On-going     PT LONG TERM GOAL #5   Title Improve FOTO to </= 40% limitation 10/21/17   Time 6   Period Weeks   Status On-going               Plan - 09/12/17 1045    Clinical Impression Statement Excellent response to initial treatment. Decreased neck pain and headache. Good soft tissue release noted with [place] followed by manual work with pt in sitting and supine.     Rehab Potential Good   PT Frequency 2x / week   PT Duration 6 weeks   PT Treatment/Interventions Patient/family education;ADLs/Self Care Home Management;Cryotherapy;Electrical Stimulation;Iontophoresis 4mg /ml Dexamethasone;Moist Heat;Traction;Ultrasound;Dry needling;Manual techniques;Therapeutic activities;Therapeutic exercise;Neuromuscular re-education   PT Next Visit Plan manual work - thoracic and cervical mobs; deep tissue work through [name] upper quarter; manual traction and stretching to tolerance; postural correction; modlaities as indicated; possibly DN    Consulted and Agree with Plan of Care Patient      Patient will benefit from skilled therapeutic intervention in order to improve the following deficits and impairments:  Postural dysfunction, Improper body mechanics, Pain, Decreased range of motion, Decreased mobility, Increased fascial restricitons, Increased muscle spasms, Decreased activity tolerance  Visit Diagnosis: Other headache syndrome  Cervicalgia  Other symptoms and signs involving the musculoskeletal system  Abnormal posture     Problem List Patient Active Problem List   Diagnosis Date Noted  . Neck muscle spasm 08/20/2017   . Neck stiffness 08/20/2017  . Numbness 08/20/2017  . Other headache syndrome 08/20/2017  . CKD stage G3a/A2, GFR 45-59 and albumin creatinine ratio 30-299 mg/g ([place]) 06/03/2017  . Combined forms of age-related cataract of both eyes 05/27/2017  . 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 Posterior vitreous detachment of both eyes 05/27/2017  . Elevated serum creatinine 04/23/2017  . Pure hypercholesterolemia 04/22/2017  . New daily persistent headache 10/22/2016  . Hyperlipidemia 04/22/2016  . Diverticulosis 12/20/2015  . Gastritis and gastroduodenitis 12/20/2015  . Helicobacter  pylori gastritis 12/20/2015  . Umbilical hernia 93/81/8299  . DM (diabetes mellitus) ([place]) 11/15/2012  . Metabolic syndrome X 37/16/9678  . GRIEF REACTION, ACUTE 03/27/2010  . ESSENTIAL HYPERTENSION, BENIGN 03/27/2010  . Dyslipidemia 01/19/2010  . Asthma 01/19/2010    Jakell Trusty Nilda Simmer PT, MPH  09/12/2017, 12:57 PM  [facility] [place], [place], [address] Phone: [phone]   Fax:  [phone]  Name: Lindsey Fischer MRN: [id number] Date of Birth: [birth year]

## 2017-09-16 ENCOUNTER — Ambulatory Visit: Payer: Medicare FFS | Admitting: Rehabilitative and Restorative Service Providers"

## 2017-09-16 ENCOUNTER — Encounter: Payer: Self-pay | Admitting: Rehabilitative and Restorative Service Providers"

## 2017-09-16 DIAGNOSIS — G4489 Other headache syndrome: Secondary | ICD-10-CM

## 2017-09-16 DIAGNOSIS — M542 Cervicalgia: Secondary | ICD-10-CM

## 2017-09-16 DIAGNOSIS — R29898 Other symptoms and signs involving the musculoskeletal system: Secondary | ICD-10-CM

## 2017-09-16 DIAGNOSIS — R293 Abnormal posture: Secondary | ICD-10-CM

## 2017-09-16 NOTE — Therapy (Signed)
[place], [place], [address] Phone: [phone]   Fax:  [phone]  Physical Therapy Treatment  Patient Details  Name: Lindsey Fischer MRN: [id number] Date of Birth: February 16, 1941 Referring Provider: Iran Planas, PA-C   Encounter Date: 09/16/2017  PT End of Session - 09/16/17 0855    Visit Number  3    Number of Visits  12    Date for PT Re-Evaluation  10/21/17    PT Start Time  0850    PT Stop Time  0952    PT Time Calculation (min)  62 min    Activity Tolerance  Patient tolerated treatment well       Past Medical History:  Diagnosis Date  . Asthma   . Combined forms of age-related cataract of both eyes 05/27/2017  . Diabetes mellitus    pre diabetes  . Hyperlipidemia   . Hypertension   . 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 Posterior vitreous detachment of both eyes 05/27/2017    Past Surgical History:  Procedure Laterality Date  . APPENDECTOMY    . CATARACT EXTRACTION Right   . OVARIAN CYST REMOVAL      There were no vitals filed for this visit.  Subjective Assessment - 09/16/17 0855    Pain Frequency  Intermittent                      [facility] Adult PT Treatment/Exercise - 09/16/17 0001      Neck Exercises: Standing   Neck Retraction  -- axial extension 5 sec x 5 to pt tolerance    axial extension 5 sec x 5 to pt tolerance      Neck Exercises: Supine   Neck Retraction  5 reps;5 secs head supported on pillow    head supported on pillow    Other Supine Exercise  occipital inhibition with massage blocks ~1 min       Shoulder Exercises: Standing   Other Standing Exercises  chest lift with scap squeeze 2-3 sec x 5 with and without noodle - to pt tolerance     Other Standing Exercises  L'sand W's x 10 each with swim noodle       Shoulder Exercises: Stretch   Other Shoulder Stretches  3 way doorway stretch 30 sec x 2 each     Other Shoulder Stretches  supine  prolonged snow angel ~ 1 min       Moist Heat Therapy   Number Minutes Moist Heat  20 Minutes    Moist Heat Location  Cervical thoracic bilat   thoracic bilat     Electrical Stimulation   Electrical Stimulation Location  Rt upper cervical; Rt upper trap/leveator     Electrical Stimulation Action  IFC    Electrical Stimulation Parameters  to tolerance    Electrical Stimulation Goals  Tone;Pain      Ultrasound   Ultrasound Location  Rt occipital and cervical musculature     Ultrasound Parameters  1.5 w/cm2; 1 mHz; 100%; 8 min     Ultrasound Goals  Pain;Other (Comment) muscular tightness    muscular tightness      Manual Therapy   Manual Therapy  -- scap retraction sitting w/noodle PT pulliing shds back    scap retraction sitting w/noodle PT pulliing shds back    Manual therapy comments  pt sitting and supine     Joint Mobilization  CPA mobs; lateral mobs upper thoracic and  cervical spine - tight mid cervical Lt     Soft tissue mobilization  deep tissue work through anterior/lateral/posterior cervical musculature; upper trap; leveator; pecs     Myofascial Release  lateral cervical to upper trap on Rt     Manual Traction  Rt UE pull through long arm to release tightness through the upper traps                  PT Long Term Goals - 09/12/17 1025      PT LONG TERM GOAL #1   Title  Improve posture and alignment with patient to demonstrate improved upright posture engaging posterior shoudler girdle musculature 10/21/17    Time  6    Period  Weeks    Status  On-going      PT LONG TERM GOAL #2   Title  Increase cervical moblity and ROM by 5-10 deg in all planes with pain free motion 10/21/17    Time  6    Period  Weeks    Status  On-going      PT LONG TERM GOAL #3   Title  Decrease frequency, intensity and duration of headaches and cervical pain by 50-70% per pt report 10/21/17    Time  6    Period  Weeks    Status  On-going      PT LONG TERM GOAL #4   Title   Independent in HEP 10/21/17    Time  6    Period  Weeks    Status  On-going      PT LONG TERM GOAL #5   Title  Improve FOTO to </= 40% limitation 10/21/17    Time  6    Period  Weeks    Status  On-going            Plan - 09/16/17 0934    Clinical Impression Statement  Patient now has intermittent headaches instead of constant. She feels she is turning her head better and that the muscular tightness is improving. Patient has persistent muscular tightness through the posterior cervical and shoulder girdle areas on Rt. She has responded well to interventions and is working well with the home program. Patient is working to make changes with posture at work and home. Progressing well toward stated goals of therapy.     Rehab Potential  Good    PT Frequency  2x / week    PT Duration  6 weeks    PT Treatment/Interventions  Patient/family education;ADLs/Self Care Home Management;Cryotherapy;Electrical Stimulation;Iontophoresis 4mg /ml Dexamethasone;Moist Heat;Traction;Ultrasound;Dry needling;Manual techniques;Therapeutic activities;Therapeutic exercise;Neuromuscular re-education    PT Next Visit Plan  manual work - thoracic and cervical mobs; deep tissue work through [place] upper quarter; manual traction and stretching to tolerance; postural correction; modlaities as indicated; possibly DN     Consulted and Agree with Plan of Care  Patient       Patient will benefit from skilled therapeutic intervention in order to improve the following deficits and impairments:  Postural dysfunction, Improper body mechanics, Pain, Decreased range of motion, Decreased mobility, Increased fascial restricitons, Increased muscle spasms, Decreased activity tolerance  Visit Diagnosis: Other headache syndrome  Cervicalgia  Other symptoms and signs involving the musculoskeletal system  Abnormal posture     Problem List Patient Active Problem List   Diagnosis Date Noted  . Neck muscle spasm 08/20/2017  .  Neck stiffness 08/20/2017  . Numbness 08/20/2017  . Other headache syndrome 08/20/2017  . CKD stage G3a/A2, GFR 45-59 and  albumin creatinine ratio 30-299 mg/g ([place]) 06/03/2017  . Combined forms of age-related cataract of both eyes 05/27/2017  . 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 Posterior vitreous detachment of both eyes 05/27/2017  . Elevated serum creatinine 04/23/2017  . Pure hypercholesterolemia 04/22/2017  . New daily persistent headache 10/22/2016  . Hyperlipidemia 04/22/2016  . Diverticulosis 12/20/2015  . Gastritis and gastroduodenitis 12/20/2015  . Helicobacter pylori gastritis 12/20/2015  . Umbilical hernia 69/62/9528  . DM (diabetes mellitus) ([place]) 11/15/2012  . Metabolic syndrome X 41/32/4401  . GRIEF REACTION, ACUTE 03/27/2010  . ESSENTIAL HYPERTENSION, BENIGN 03/27/2010  . Dyslipidemia 01/19/2010  . Asthma 01/19/2010    Celyn Nilda Simmer PT, MPH  09/16/2017, 11:17 AM  [facility] [place], [place], [address] Phone: [phone]   Fax:  [phone]  Name: Lindsey Fischer MRN: [id number] Date of Birth: 1941/04/14

## 2017-09-23 ENCOUNTER — Ambulatory Visit: Payer: Medicare FFS | Admitting: Rehabilitative and Restorative Service Providers"

## 2017-09-23 DIAGNOSIS — R293 Abnormal posture: Secondary | ICD-10-CM | POA: Diagnosis not present

## 2017-09-23 DIAGNOSIS — G4489 Other headache syndrome: Secondary | ICD-10-CM | POA: Diagnosis present

## 2017-09-23 DIAGNOSIS — R29898 Other symptoms and signs involving the musculoskeletal system: Secondary | ICD-10-CM

## 2017-09-23 DIAGNOSIS — M542 Cervicalgia: Secondary | ICD-10-CM

## 2017-09-23 NOTE — Therapy (Signed)
[place], [place], [address] Phone: [phone]   Fax:  [phone]  Physical Therapy Treatment  Patient Details  Name: Lindsey Fischer MRN: [id number] Date of Birth: 12-Oct-1941 Referring Provider: Rolland Bimler PA-C   Encounter Date: 09/23/2017    Past Medical History:  Diagnosis Date  . Asthma   . Combined forms of age-related cataract of both eyes 05/27/2017  . Diabetes mellitus    pre diabetes  . Hyperlipidemia   . Hypertension   . 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 Posterior vitreous detachment of both eyes 05/27/2017    Past Surgical History:  Procedure Laterality Date  . APPENDECTOMY    . CATARACT EXTRACTION Right   . OVARIAN CYST REMOVAL      There were no vitals filed for this visit.  Subjective Assessment - 09/23/17 0940    Subjective  Improving. Less frequent HA. Neck pain is present with turning head. Pleased with progress Pain does not go up her head lkie it used to. No pain on the side of the head.     Currently in Pain?  Yes    Pain Score  3     Pain Location  Neck    Pain Orientation  Right    Pain Descriptors / Indicators  Tightness;Aching    Pain Type  Chronic pain    Pain Onset  More than a month ago    Pain Frequency  Intermittent         [facility] PT Assessment - 09/23/17 0001      Assessment   Medical Diagnosis  Headache; cervical pain     Referring Provider  Rolland Bimler PA-C    Onset Date/Surgical Date  02/09/17    Hand Dominance  Right    Next MD Visit  PRN     Prior Therapy  none      AROM   Right/Left Shoulder  -- end range tightness bilat Lt > Rt     Cervical Flexion  48    Cervical Extension  31    Cervical - Right Side Bend  17 pain Rt > Lt     Cervical - Left Side Bend  19 pain Rt    Cervical - Right Rotation  25 pain bilat Rt > Lt     Cervical - Left Rotation  23 pain Rt > Lt       Palpation   Spinal mobility  hypomobile upper thoracic  and cervical spine with CPA and lateral mobs     Palpation comment  significant but improving muscular tightness through ant/lat/post cervical musculature; pecs; upper traps; leveator Rt > Lt                   [facility] Adult PT Treatment/Exercise - 09/23/17 0001      Neck Exercises: Supine   Neck Retraction  5 reps;5 secs head supported on pillow     Other Supine Exercise  occipital inhibition with massage blocks ~1 min       Shoulder Exercises: Standing   Other Standing Exercises  chest lift with scap squeeze 2-3 sec x 5 with and without noodle - to pt tolerance     Other Standing Exercises  L'sand W's x 10 each with swim noodle       Shoulder Exercises: Stretch   Other Shoulder Stretches  3 way doorway stretch 30 sec x 2 each     Other Shoulder Stretches  supine prolonged snow angel ~ 1 min       Moist Heat Therapy   Number Minutes Moist Heat  20 Minutes    Moist Heat Location  Cervical thoracic bilat      Electrical Stimulation   Electrical Stimulation Location  Rt upper cervical; Rt upper trap/leveator     Electrical Stimulation Action  IFC    Electrical Stimulation Parameters  to tolerance    Electrical Stimulation Goals  Tone;Pain      Ultrasound   Ultrasound Location  Rt occipital to lateral cervival/upper trap    Ultrasound Parameters  1.5 w/cm2; 1 mHz; 100%; 8 min     Ultrasound Goals  Pain;Other (Comment) muscular tightness       Manual Therapy   Manual Therapy  -- scap retraction sitting w/noodle PT pulliing shds back     Manual therapy comments  pt sitting and supine     Joint Mobilization  CPA mobs; lateral mobs upper thoracic and cervical spine - tight mid cervical Lt     Soft tissue mobilization  deep tissue work through anterior/lateral/posterior cervical musculature; upper trap; leveator; pecs     Myofascial Release  lateral cervical to upper trap on Rt     Manual Traction  Rt UE pull through long arm to release tightness through the upper traps                   PT Long Term Goals - 09/23/17 1015      PT LONG TERM GOAL #1   Title  Improve posture and alignment with patient to demonstrate improved upright posture engaging posterior shoudler girdle musculature 10/21/17    Time  6    Period  Weeks    Status  On-going      PT LONG TERM GOAL #2   Title  Increase cervical moblity and ROM by 5-10 deg in all planes with pain free motion 10/21/17    Time  6    Period  Weeks    Status  On-going      PT LONG TERM GOAL #3   Title  Decrease frequency, intensity and duration of headaches and cervical pain by 50-70% per pt report 10/21/17    Time  6    Period  Weeks    Status  Partially Met      PT LONG TERM GOAL #4   Title  Independent in HEP 10/21/17    Time  6    Period  Weeks    Status  On-going      PT LONG TERM GOAL #5   Title  Improve FOTO to </= 40% limitation 10/21/17    Time  6    Period  Weeks    Status  On-going            Plan - 09/23/17 1028    Clinical Impression Statement  No headaches in the past week. Some intermittent neck pain. Patient continues to have significant muscular tightness to palpation through the Rt lateral and posterior cervical musculature as well as into the pecs and anterior shoulder. She is progressing well toward goals of therapy.     Rehab Potential  Good    PT Frequency  2x / week    PT Duration  6 weeks    PT Treatment/Interventions  Patient/family education;ADLs/Self Care Home Management;Cryotherapy;Electrical Stimulation;Iontophoresis 40m/ml Dexamethasone;Moist Heat;Traction;Ultrasound;Dry needling;Manual techniques;Therapeutic activities;Therapeutic exercise;Neuromuscular re-education    PT Next Visit Plan  manual work - thoracic and cervical  mobs; deep tissue work through [place] upper quarter; manual traction and stretching to tolerance; postural correction; modlaities as indicated; possibly DN     Consulted and Agree with Plan of Care  Patient       Patient will benefit  from skilled therapeutic intervention in order to improve the following deficits and impairments:  Postural dysfunction, Improper body mechanics, Pain, Decreased range of motion, Decreased mobility, Increased fascial restricitons, Increased muscle spasms, Decreased activity tolerance  Visit Diagnosis: Other headache syndrome  Cervicalgia  Other symptoms and signs involving the musculoskeletal system  Abnormal posture     Problem List Patient Active Problem List   Diagnosis Date Noted  . Neck muscle spasm 08/20/2017  . Neck stiffness 08/20/2017  . Numbness 08/20/2017  . Other headache syndrome 08/20/2017  . CKD stage G3a/A2, GFR 45-59 and albumin creatinine ratio 30-299 mg/g ([place]) 06/03/2017  . Combined forms of age-related cataract of both eyes 05/27/2017  . 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 Posterior vitreous detachment of both eyes 05/27/2017  . Elevated serum creatinine 04/23/2017  . Pure hypercholesterolemia 04/22/2017  . New daily persistent headache 10/22/2016  . Hyperlipidemia 04/22/2016  . Diverticulosis 12/20/2015  . Gastritis and gastroduodenitis 12/20/2015  . Helicobacter pylori gastritis 12/20/2015  . Umbilical hernia 49/67/5916  . DM (diabetes mellitus) ([place]) 11/15/2012  . Metabolic syndrome X 38/46/6599  . GRIEF REACTION, ACUTE 03/27/2010  . ESSENTIAL HYPERTENSION, BENIGN 03/27/2010  . Dyslipidemia 01/19/2010  . Asthma 01/19/2010    Lindsey Fischer PT, MPH  09/23/2017, 10:35 AM  [facility] [place], [place], [address] Phone: [phone]   Fax:  [phone]  Name: Lindsey Fischer MRN: [id number] Date of Birth: [birth year]

## 2017-09-25 ENCOUNTER — Encounter: Payer: Self-pay | Admitting: Physical Therapy

## 2017-09-25 ENCOUNTER — Ambulatory Visit: Payer: Medicare FFS | Admitting: Physical Therapy

## 2017-09-25 DIAGNOSIS — G4489 Other headache syndrome: Secondary | ICD-10-CM

## 2017-09-25 DIAGNOSIS — R29898 Other symptoms and signs involving the musculoskeletal system: Secondary | ICD-10-CM

## 2017-09-25 DIAGNOSIS — R293 Abnormal posture: Secondary | ICD-10-CM

## 2017-09-25 DIAGNOSIS — M542 Cervicalgia: Secondary | ICD-10-CM | POA: Diagnosis present

## 2017-09-25 NOTE — Therapy (Signed)
[facility], [place], [address] Phone: [phone]   Fax:  [phone]  Physical Therapy Treatment  Patient Details  Name: Lindsey Fischer MRN: [id number] Date of Birth: 04/03/1941 Referring Provider: Iran Planas, PA-C   Encounter Date: 09/25/2017  PT End of Session - 09/25/17 1027    Visit Number  4    Number of Visits  12    Date for PT Re-Evaluation  10/21/17    PT Start Time  1020    PT Stop Time  1122    PT Time Calculation (min)  62 min    Activity Tolerance  Patient tolerated treatment well    Behavior During Therapy  [facility] for tasks assessed/performed       Past Medical History:  Diagnosis Date  . Asthma   . Combined forms of age-related cataract of both eyes 05/27/2017  . Diabetes mellitus    pre diabetes  . Hyperlipidemia   . Hypertension   . 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 Posterior vitreous detachment of both eyes 05/27/2017    Past Surgical History:  Procedure Laterality Date  . APPENDECTOMY    . CATARACT EXTRACTION Right   . OVARIAN CYST REMOVAL      There were no vitals filed for this visit.  Subjective Assessment - 09/25/17 1027    Subjective  She reports she had no pain in the head yesterday at work. "The pain is leaving my head, thank God".  She mostly has pain in neck with attemtps to turn head.      Currently in Pain?  Yes    Pain Score  3     Pain Location  Neck    Pain Orientation  Right    Pain Descriptors / Indicators  Aching;Sore;Tightness    Aggravating Factors   prolonged sitting, forward head position    Pain Relieving Factors  medicine, hot towels, cream         [facility] PT Assessment - 09/25/17 0001      Assessment   Medical Diagnosis  Headache; cervical pain     Referring Provider  Iran Planas, PA-C    Onset Date/Surgical Date  02/09/17    Hand Dominance  Right    Next MD Visit  PRN         [facility] Adult PT Treatment/Exercise - 09/25/17  0001      Neck Exercises: Seated   Cervical Rotation  Right;Left;10 reps to tolerance, VC for posture    Other Seated Exercise  L's x 5 sec hold x 10, W's x 10 sec hold x 10 reps       Neck Exercises: Supine   Neck Retraction  5 reps;5 secs head supported on pillow     Other Supine Exercise  thoracic lifts x 10 reps x 10 sec holds.       Moist Heat Therapy   Number Minutes Moist Heat  20 Minutes    Moist Heat Location  Cervical thoracic bilat      Electrical Stimulation   Electrical Stimulation Location  Rt upper cervical; Rt upper trap/leveator     Electrical Stimulation Action  IFC    Electrical Stimulation Parameters  to tolerance     Electrical Stimulation Goals  Tone;Pain      Manual Therapy   Manual Therapy  Passive ROM    Manual therapy comments  Pt supine    Soft tissue mobilization  STM to bilat cervical paraspinals  and upper trap, TPR to Rt levator, deep tissue through cervical paraspinals, scalenes and ant cervical musculature    Myofascial Release  Rt pec, platysma, suboccipital release, upper trap    Passive ROM  flexion; flexion with slight rotation     Manual Traction  cervical traction x 30 sec x 3 reps; cervical traction with Rt scapular depression         Trigger Point Dry Needling - 09/25/17 1107    Consent Given?  Yes    Education Handout Provided  Yes    Muscles Treated Upper Body  Suboccipitals muscle group scaleni Rt - all needling on Rt     SubOccipitals Response  Palpable increased muscle length           PT Education - 09/25/17 1055    Education provided  Yes    Education Details  DN info.     Person(s) Educated  Patient    Methods  Explanation;Handout    Comprehension  Verbalized understanding          PT Long Term Goals - 09/23/17 1015      PT LONG TERM GOAL #1   Title  Improve posture and alignment with patient to demonstrate improved upright posture engaging posterior shoudler girdle musculature 10/21/17    Time  6    Period   Weeks    Status  On-going      PT LONG TERM GOAL #2   Title  Increase cervical moblity and ROM by 5-10 deg in all planes with pain free motion 10/21/17    Time  6    Period  Weeks    Status  On-going      PT LONG TERM GOAL #3   Title  Decrease frequency, intensity and duration of headaches and cervical pain by 50-70% per pt report 10/21/17    Time  6    Period  Weeks    Status  Partially Met      PT LONG TERM GOAL #4   Title  Independent in HEP 10/21/17    Time  6    Period  Weeks    Status  On-going      PT LONG TERM GOAL #5   Title  Improve FOTO to </= 40% limitation 10/21/17    Time  6    Period  Weeks    Status  On-going            Plan - 09/25/17 1051    Clinical Impression Statement  Pt has had positive response to manual work last few sessions; reporting elimination of headaches, less neck pain.  She continues with significant muscular tightness through Rt lateral / posterior cervical musculature.  Soft tissue release with DN, in scalene and Rt cervical paraspinals (provided by Gillermo Murdoch, PT).  Pt progressing well towards goals.     Rehab Potential  Good    PT Frequency  2x / week    PT Duration  6 weeks    PT Treatment/Interventions  Patient/family education;ADLs/Self Care Home Management;Cryotherapy;Electrical Stimulation;Iontophoresis 41m/ml Dexamethasone;Moist Heat;Traction;Ultrasound;Dry needling;Manual techniques;Therapeutic activities;Therapeutic exercise;Neuromuscular re-education    PT Next Visit Plan  assess response to DN.  Continue manual therapy including thoracic/cervical mobs and deep tissue work.     Consulted and Agree with Plan of Care  Patient       Patient will benefit from skilled therapeutic intervention in order to improve the following deficits and impairments:  Postural dysfunction, Improper body mechanics, Pain, Decreased range of  motion, Decreased mobility, Increased fascial restricitons, Increased muscle spasms, Decreased activity  tolerance  Visit Diagnosis: Cervicalgia  Other symptoms and signs involving the musculoskeletal system  Abnormal posture  Other headache syndrome     Problem List Patient Active Problem List   Diagnosis Date Noted  . Neck muscle spasm 08/20/2017  . Neck stiffness 08/20/2017  . Numbness 08/20/2017  . Other headache syndrome 08/20/2017  . CKD stage G3a/A2, GFR 45-59 and albumin creatinine ratio 30-299 mg/g ([place]) 06/03/2017  . Combined forms of age-related cataract of both eyes 05/27/2017  . 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 Posterior vitreous detachment of both eyes 05/27/2017  . Elevated serum creatinine 04/23/2017  . Pure hypercholesterolemia 04/22/2017  . New daily persistent headache 10/22/2016  . Hyperlipidemia 04/22/2016  . Diverticulosis 12/20/2015  . Gastritis and gastroduodenitis 12/20/2015  . Helicobacter pylori gastritis 12/20/2015  . Umbilical hernia 20/08/711  . DM (diabetes mellitus) ([place]) 11/15/2012  . Metabolic syndrome X 19/75/8832  . GRIEF REACTION, ACUTE 03/27/2010  . ESSENTIAL HYPERTENSION, BENIGN 03/27/2010  . Dyslipidemia 01/19/2010  . Asthma 01/19/2010   Kerin Perna, PTA 09/25/17 11:09 AM  Celyn P. Helene Kelp PT, MPH 09/25/17 11:09 AM   [facility] [place], [place], [address] Phone: [phone]   Fax:  [phone]  Name: NATALI LAVALLEE MRN: [id number] Date of Birth: 28-Aug-1941

## 2017-09-25 NOTE — Patient Instructions (Signed)
Trigger Point Dry Needling  . What is Trigger Point Dry Needling (DN)? o DN is a physical therapy technique used to treat muscle pain and dysfunction. Specifically, DN helps deactivate muscle trigger points (muscle knots).  o A thin filiform needle is used to penetrate the skin and stimulate the underlying trigger point. The goal is for a local twitch response (LTR) to occur and for the trigger point to relax. No medication of any kind is injected during the procedure.   . What Does Trigger Point Dry Needling Feel Like?  o The procedure feels different for each individual patient. Some patients report that they do not actually feel the needle enter the skin and overall the process is not painful. Very mild bleeding may occur. However, many patients feel a deep cramping in the muscle in which the needle was inserted. This is the local twitch response.   Marland Kitchen How Will I feel after the treatment? o Soreness is normal, and the onset of soreness may not occur for a few hours. Typically this soreness does not last longer than two days.  o Bruising is uncommon, however; ice can be used to decrease any possible bruising.  o In rare cases feeling tired or nauseous after the treatment is normal. In addition, your symptoms may get worse before they get better, this period will typically not last longer than 24 hours.   . What Can I do After My Treatment? o Increase your hydration by drinking more water for the next 24 hours. o You may place ice or heat on the areas treated that have become sore, however, do not use heat on inflamed or bruised areas. Heat often brings more relief post needling. o You can continue your regular activities, but vigorous activity is not recommended initially after the treatment for 24 hours. o DN is best combined with other physical therapy such as strengthening, stretching, and other therapies.    [facility] [place], [place] [address]  [phone] (office) [phone] (fax)

## 2017-09-30 ENCOUNTER — Encounter: Payer: Medicare FFS | Admitting: Physical Therapy

## 2017-09-30 ENCOUNTER — Ambulatory Visit: Payer: Medicare FFS | Admitting: Physical Therapy

## 2017-09-30 DIAGNOSIS — G4489 Other headache syndrome: Secondary | ICD-10-CM

## 2017-09-30 DIAGNOSIS — R29898 Other symptoms and signs involving the musculoskeletal system: Secondary | ICD-10-CM | POA: Diagnosis not present

## 2017-09-30 DIAGNOSIS — R293 Abnormal posture: Secondary | ICD-10-CM

## 2017-09-30 DIAGNOSIS — M542 Cervicalgia: Secondary | ICD-10-CM

## 2017-09-30 NOTE — Therapy (Signed)
[place], [place], [address] Phone: [phone]   Fax:  [phone]  Physical Therapy Treatment  Patient Details  Name: Lindsey Fischer MRN: [id number] Date of Birth: December 31, 1940 Referring Provider: Iran Planas, PA-C   Encounter Date: 09/30/2017  PT End of Session - 09/30/17 1525    Visit Number  5    Number of Visits  12    Date for PT Re-Evaluation  10/21/17    PT Start Time  1519    PT Stop Time  1617    PT Time Calculation (min)  58 min    Activity Tolerance  Patient tolerated treatment well    Behavior During Therapy  [facility] for tasks assessed/performed       Past Medical History:  Diagnosis Date  . Asthma   . Combined forms of age-related cataract of both eyes 05/27/2017  . Diabetes mellitus    pre diabetes  . Hyperlipidemia   . Hypertension   . 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 Posterior vitreous detachment of both eyes 05/27/2017    Past Surgical History:  Procedure Laterality Date  . APPENDECTOMY    . CATARACT EXTRACTION Right   . OVARIAN CYST REMOVAL      There were no vitals filed for this visit.  Subjective Assessment - 09/30/17 1531    Subjective  Pt reports her head pain returned shortly after last session.  She reports she had soreness after DN for 3 days.  She has noticed she can look down towards ground a little easier.  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     Patient Stated Goals  get rid of pain if at all possible - tired of the pain     Currently in Pain?  Yes    Pain Score  5     Pain Location  Neck    Pain Orientation  Right    Pain Descriptors / Indicators  Aching;Sore;Tingling    Pain Radiating Towards  back side of neck.     Aggravating Factors   prolonged sitting    Pain Relieving Factors  hot towels, medicine, cold pack.          [facility] PT Assessment - 09/30/17 0001      Assessment   Medical Diagnosis  Headache; cervical pain       AROM   Cervical Flexion  55    Cervical Extension  40    Cervical - Right Side Bend  17    Cervical - Left Side Bend  15    Cervical - Right Rotation  40 supine    Cervical - Left Rotation  22 supine        [facility] Adult PT Treatment/Exercise - 09/30/17 0001      Neck Exercises: Supine   Neck Retraction  5 reps;5 secs head supported on pillow     Cervical Rotation  Right;Left;5 reps to tolerance    Other Supine Exercise  bilat shoulder flex with yellow band (overhead pull) x 10 reps, to tolerance.       Shoulder Exercises: Standing   Other Standing Exercises  L'sand W's x 10 each with swim noodle       Shoulder Exercises: Stretch   Cross Chest Stretch  3 reps;10 seconds arms hugging self, head tilted down    Other Shoulder Stretches  3 way doorway stretch 30 sec x  2 each, trial of unilateral mid-level doorway stretch x 20 sec        Electrical Stimulation   Electrical Stimulation Location  Rt/Lt upper cervical; Rt upper trap/levator     Electrical Stimulation Action  IFC    Electrical Stimulation Parameters  to tolerance     Electrical Stimulation Goals  Tone;Pain      Manual Therapy   Manual therapy comments  Pt supine    Soft tissue mobilization  STM to bilat cervical paraspinals and upper trap, TPR to Rt levator, deep tissue through cervical paraspinals, scalenes and ant cervical musculature    Myofascial Release  Rt pec, platysma, suboccipital release, upper trap    Passive ROM  lateral flexion with slight rotation     Manual Traction  cervical traction x 30 sec x 3 reps; cervical traction with Rt scapular depression        Neck Exercises: Stretches   Upper Trapezius Stretch  2 reps;10 seconds                  PT Long Term Goals - 09/23/17 1015      PT LONG TERM GOAL #1   Title  Improve posture and alignment with patient to demonstrate improved upright posture engaging posterior shoudler girdle musculature  10/21/17    Time  6    Period  Weeks    Status  On-going      PT LONG TERM GOAL #2   Title  Increase cervical moblity and ROM by 5-10 deg in all planes with pain free motion 10/21/17    Time  6    Period  Weeks    Status  On-going      PT LONG TERM GOAL #3   Title  Decrease frequency, intensity and duration of headaches and cervical pain by 50-70% per pt report 10/21/17    Time  6    Period  Weeks    Status  Partially Met      PT LONG TERM GOAL #4   Title  Independent in HEP 10/21/17    Time  6    Period  Weeks    Status  On-going      PT LONG TERM GOAL #5   Title  Improve FOTO to </= 40% limitation 10/21/17    Time  6    Period  Weeks    Status  On-going            Plan - 09/30/17 1636    Clinical Impression Statement  Pt demonstrated improved cervical ROM; continued limitation with lateral flexion and Lt rotation. Palpable tightness in Rt scalene and Rt cervical paraspinals.  Pt reported elimination of head pain at end of session.  Pt making gradual progress towards goals.     Rehab Potential  Good    PT Frequency  2x / week    PT Duration  6 weeks    PT Treatment/Interventions  Patient/family education;ADLs/Self Care Home Management;Cryotherapy;Electrical Stimulation;Iontophoresis 26m/ml Dexamethasone;Moist Heat;Traction;Ultrasound;Dry needling;Manual techniques;Therapeutic activities;Therapeutic exercise;Neuromuscular re-education    PT Next Visit Plan  Continue manual therapy including thoracic/cervical mobs and deep tissue work.     Consulted and Agree with Plan of Care  Patient       Patient will benefit from skilled therapeutic intervention in order to improve the following deficits and impairments:  Postural dysfunction, Improper body mechanics, Pain, Decreased range of motion, Decreased mobility, Increased fascial restricitons, Increased muscle spasms, Decreased activity tolerance  Visit Diagnosis: Cervicalgia  Other symptoms and signs involving the  musculoskeletal system  Abnormal posture  Other headache syndrome     Problem List Patient Active Problem List   Diagnosis Date Noted  . Neck muscle spasm 08/20/2017  . Neck stiffness 08/20/2017  . Numbness 08/20/2017  . Other headache syndrome 08/20/2017  . CKD stage G3a/A2, GFR 45-59 and albumin creatinine ratio 30-299 mg/g ([place]) 06/03/2017  . Combined forms of age-related cataract of both eyes 05/27/2017  . 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 Posterior vitreous detachment of both eyes 05/27/2017  . Elevated serum creatinine 04/23/2017  . Pure hypercholesterolemia 04/22/2017  . New daily persistent headache 10/22/2016  . Hyperlipidemia 04/22/2016  . Diverticulosis 12/20/2015  . Gastritis and gastroduodenitis 12/20/2015  . Helicobacter pylori gastritis 12/20/2015  . Umbilical hernia 74/82/7078  . DM (diabetes mellitus) ([place]) 11/15/2012  . Metabolic syndrome X 67/54/4920  . GRIEF REACTION, ACUTE 03/27/2010  . ESSENTIAL HYPERTENSION, BENIGN 03/27/2010  . Dyslipidemia 01/19/2010  . Asthma 01/19/2010   Kerin Perna, PTA 09/30/17 4:45 PM  [facility] [place], [place], [address] Phone: [phone]   Fax:  [phone]  Name: ADAN BEAL MRN: [id number] Date of Birth: 09/10/1941

## 2017-10-06 ENCOUNTER — Ambulatory Visit: Payer: Medicare FFS | Admitting: Physical Therapy

## 2017-10-06 DIAGNOSIS — R293 Abnormal posture: Secondary | ICD-10-CM

## 2017-10-06 DIAGNOSIS — G4489 Other headache syndrome: Secondary | ICD-10-CM | POA: Diagnosis not present

## 2017-10-06 DIAGNOSIS — R29898 Other symptoms and signs involving the musculoskeletal system: Secondary | ICD-10-CM | POA: Diagnosis not present

## 2017-10-06 DIAGNOSIS — M542 Cervicalgia: Secondary | ICD-10-CM

## 2017-10-06 NOTE — Therapy (Signed)
[place], [place], [address] Phone: [phone]   Fax:  [phone]  Physical Therapy Treatment  Patient Details  Name: Lindsey Fischer MRN: [id number] Date of Birth: 1941/01/31 Referring Provider: Iran Planas, [facility]   Encounter Date: 10/06/2017  PT End of Session - 10/06/17 1023    Visit Number  6    Number of Visits  12    Date for PT Re-Evaluation  10/21/17    PT Start Time  0930    PT Stop Time  1036    PT Time Calculation (min)  66 min       Past Medical History:  Diagnosis Date  . Asthma   . Combined forms of age-related cataract of both eyes 05/27/2017  . Diabetes mellitus    pre diabetes  . Hyperlipidemia   . Hypertension   . 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 Posterior vitreous detachment of both eyes 05/27/2017    Past Surgical History:  Procedure Laterality Date  . APPENDECTOMY    . CATARACT EXTRACTION Right   . OVARIAN CYST REMOVAL      There were no vitals filed for this visit.  Subjective Assessment - 10/06/17 1023    Subjective  Pt reports her pain increased the day after last visit.  She feels maybe it is just because she has been more active due to holidays.  Pt reports she has no pain as long as she doesn't move her head.  Her sister is in town visiting and drove her to appt today.  "I noticed, when I'm tired, my neck and head hurt worse".     Patient Stated Goals  get rid of pain if at all possible - tired of the pain     Currently in Pain?  Yes    Pain Score  4     Pain Location  Neck    Pain Orientation  Right    Pain Descriptors / Indicators  Aching;Sore    Aggravating Factors   prolonged sitting    Pain Relieving Factors  hot towels, medicine, estim, cold pack.          [facility] PT Assessment - 10/06/17 0001      Assessment   Medical Diagnosis  Headache; cervical pain     Referring Provider  Iran Planas, [facility]    Onset Date/Surgical Date  02/09/17     Hand Dominance  Right    Next MD Visit  PRN       AROM   Cervical - Right Side Bend  17    Cervical - Left Side Bend  15    Cervical - Right Rotation  25    Cervical - Left Rotation  25       [facility] Adult PT Treatment/Exercise - 10/06/17 0001      Neck Exercises: Supine   Neck Retraction  5 secs;10 reps head supported on pillow     Cervical Rotation  Right;Left;5 reps to tolerance    Other Supine Exercise  thoracic lifts x 10 reps x 10 sec holds.       Moist Heat Therapy   Number Minutes Moist Heat  20 Minutes    Moist Heat Location  Cervical thoracic bilat      Electrical Stimulation   Electrical Stimulation Location  Lt/Rt mid thoracic paraspinals; Rt upper trap, Rt cervical paraspinals.     Electrical Stimulation Action  IFC    Electrical Stimulation Parameters  to tolerance     Electrical Stimulation Goals  Tone;Pain      Ultrasound   Ultrasound Location  Rt cervical paraspinals to upper trap; Lt upper thoracic paraspinals.     Ultrasound Parameters  1.2 w/cm2, 8 min, 100%    Ultrasound Goals  Pain;Other (Comment) muscular tightness       Manual Therapy   Soft tissue mobilization  STM to bilat cervical paraspinals and upper trap, TPR to Rt levator, deep tissue through cervical paraspinals, scalenes and ant cervical musculature; bilat SCM , bilat thoracic parapsinals, rhomboid.    Myofascial Release  Rt pec, platysma, suboccipital release, upper trap    Passive ROM  lateral flexion with slight rotation; flexion     Manual Traction  cervical traction x 30 sec x 3 reps; cervical traction with Rt scapular depression           PT Long Term Goals - 09/23/17 1015      PT LONG TERM GOAL #1   Title  Improve posture and alignment with patient to demonstrate improved upright posture engaging posterior shoudler girdle musculature 10/21/17    Time  6    Period  Weeks    Status  On-going      PT LONG TERM GOAL #2   Title  Increase cervical moblity and ROM by 5-10 deg in all  planes with pain free motion 10/21/17    Time  6    Period  Weeks    Status  On-going      PT LONG TERM GOAL #3   Title  Decrease frequency, intensity and duration of headaches and cervical pain by 50-70% per pt report 10/21/17    Time  6    Period  Weeks    Status  Partially Met      PT LONG TERM GOAL #4   Title  Independent in HEP 10/21/17    Time  6    Period  Weeks    Status  On-going      PT LONG TERM GOAL #5   Title  Improve FOTO to </= 40% limitation 10/21/17    Time  6    Period  Weeks    Status  On-going            Plan - 10/06/17 1228    Clinical Impression Statement  Pt continues with limited cervical ROM and tightness in cervical musculature.  Mild guarding with manual therapy.  Pt reported tenderness in Lt thoracic paraspinals and Rt cervical paraspinals with manual therapy. Pt may benefit from spinal mobilization from PT next session.  Limited progress towards goals this vist.     Rehab Potential  Good    PT Frequency  2x / week    PT Duration  6 weeks    PT Treatment/Interventions  Patient/family education;ADLs/Self Care Home Management;Cryotherapy;Electrical Stimulation;Iontophoresis 66m/ml Dexamethasone;Moist Heat;Traction;Ultrasound;Dry needling;Manual techniques;Therapeutic activities;Therapeutic exercise;Neuromuscular re-education    PT Next Visit Plan  Continue manual therapy including thoracic/cervical mobs and deep tissue work. combo [place] post Rt neck.     Consulted and Agree with Plan of Care  Patient       Patient will benefit from skilled therapeutic intervention in order to improve the following deficits and impairments:  Postural dysfunction, Improper body mechanics, Pain, Decreased range of motion, Decreased mobility, Increased fascial restricitons, Increased muscle spasms, Decreased activity tolerance  Visit Diagnosis: Cervicalgia  Other symptoms and signs involving the musculoskeletal system  Abnormal posture  Other headache  syndrome  Problem List Patient Active Problem List   Diagnosis Date Noted  . Neck muscle spasm 08/20/2017  . Neck stiffness 08/20/2017  . Numbness 08/20/2017  . Other headache syndrome 08/20/2017  . CKD stage G3a/A2, GFR 45-59 and albumin creatinine ratio 30-299 mg/g ([place]) 06/03/2017  . Combined forms of age-related cataract of both eyes 05/27/2017  . 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 Posterior vitreous detachment of both eyes 05/27/2017  . Elevated serum creatinine 04/23/2017  . Pure hypercholesterolemia 04/22/2017  . New daily persistent headache 10/22/2016  . Hyperlipidemia 04/22/2016  . Diverticulosis 12/20/2015  . Gastritis and gastroduodenitis 12/20/2015  . Helicobacter pylori gastritis 12/20/2015  . Umbilical hernia 25/67/2091  . DM (diabetes mellitus) ([place]) 11/15/2012  . Metabolic syndrome X 98/12/2177  . GRIEF REACTION, ACUTE 03/27/2010  . ESSENTIAL HYPERTENSION, BENIGN 03/27/2010  . Dyslipidemia 01/19/2010  . Asthma 01/19/2010   Kerin Perna, PTA 10/06/17 12:41 PM  [facility] [place], [place], [address] Phone: [phone]   Fax:  [phone]  Name: Lindsey Fischer MRN: [id number] Date of Birth: 1941-05-14

## 2017-10-07 ENCOUNTER — Other Ambulatory Visit: Payer: Self-pay | Admitting: Physician Assistant

## 2017-10-09 ENCOUNTER — Ambulatory Visit: Payer: Medicare FFS | Admitting: Physical Therapy

## 2017-10-09 DIAGNOSIS — R29898 Other symptoms and signs involving the musculoskeletal system: Secondary | ICD-10-CM

## 2017-10-09 DIAGNOSIS — G4489 Other headache syndrome: Secondary | ICD-10-CM | POA: Diagnosis not present

## 2017-10-09 DIAGNOSIS — R293 Abnormal posture: Secondary | ICD-10-CM | POA: Diagnosis not present

## 2017-10-09 DIAGNOSIS — M542 Cervicalgia: Secondary | ICD-10-CM | POA: Diagnosis present

## 2017-10-09 NOTE — Therapy (Signed)
[place], [place], [address] Phone: [phone]   Fax:  [phone]  Physical Therapy Treatment  Patient Details  Name: Lindsey Fischer MRN: [id number] Date of Birth: Apr 14, 1941 Referring Provider: Iran Planas, [facility]   Encounter Date: 10/09/2017  PT End of Session - 10/09/17 1245    Visit Number  7    Number of Visits  12    Date for PT Re-Evaluation  10/21/17    PT Start Time  1149    PT Stop Time  1255    PT Time Calculation (min)  66 min    Activity Tolerance  Patient tolerated treatment well    Behavior During Therapy  [facility] for tasks assessed/performed       Past Medical History:  Diagnosis Date  . Asthma   . Combined forms of age-related cataract of both eyes 05/27/2017  . Diabetes mellitus    pre diabetes  . Hyperlipidemia   . Hypertension   . 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 Posterior vitreous detachment of both eyes 05/27/2017    Past Surgical History:  Procedure Laterality Date  . APPENDECTOMY    . CATARACT EXTRACTION Right   . OVARIAN CYST REMOVAL      There were no vitals filed for this visit.  Subjective Assessment - 10/09/17 1151    Subjective  "I didn't have any pain yesterday or day before that.  My pain has been so slight, I haven't taken any medicine in a week."  Pt reports she may have overdone it with the self suboccipital release with balls last night.  She has pain up to a 5/10 when she turns her head Rt. If she doesn't move neck, the pain is minimal to none.     Patient Stated Goals  get rid of pain if at all possible - tired of the pain     Currently in Pain?  Yes    Pain Score  2     Pain Location  Head    Pain Orientation  Posterior    Pain Descriptors / Indicators  Aching    Aggravating Factors   turning head, looking down.     Pain Relieving Factors  cold pack, heating pad          [facility] PT Assessment - 10/09/17 0001      Assessment   Medical  Diagnosis  Headache; cervical pain     Referring Provider  Iran Planas, [facility]    Onset Date/Surgical Date  02/09/17    Hand Dominance  Right    Next MD Visit  PRN       AROM   Cervical Flexion  50    Cervical Extension  32    Cervical - Right Side Bend  15 pain on Rt side of neck    Cervical - Left Side Bend  20    Cervical - Right Rotation  38    Cervical - Left Rotation  30         [facility] Adult PT Treatment/Exercise - 10/09/17 0001      Neck Exercises: Machines for Strengthening   UBE (Upper Arm Bike)  L1: (sitting ) 1.5 min forward, 1.5 min backward      Moist Heat Therapy   Number Minutes Moist Heat  20 Minutes    Moist Heat Location  Cervical thoracic bilat      Electrical Stimulation   Electrical Stimulation Action  IFC  Electrical Stimulation Parameters   to tolerance    Electrical Stimulation Goals  Tone;Pain      Manual Therapy   Manual Therapy  Scapular mobilization;Soft tissue mobilization    Manual therapy comments  Pt in Lt sidelying.     Soft tissue mobilization  STM to Rt lateral and posterior neck musculature as well as upper trap and levator.  TPR to Rt cervical paraspinals with active small motions of rotation. Deep tissue work to cervical paraspinals.  Pin and stretch to upper trap, levator, scalenes. STM to Lt SCM.     Myofascial Release  Rt suboccipital release    Scapular Mobilization  (included with pin and stretch of cervical musculature)      Neck Exercises: Stretches   Other Neck Stretches  3 position doorway stretch x 30 sec x 2 reps each position     Other Neck Stretches  seated, arms resting on thighs:  cervical flexion to neutral x 8 reps (to increase ROM), cervical diagonals to tolerance x 5 each direction                   PT Long Term Goals - 10/09/17 1241      PT LONG TERM GOAL #1   Title  Improve posture and alignment with patient to demonstrate improved upright posture engaging posterior shoudler girdle musculature  10/21/17    Time  6    Period  Weeks    Status  On-going      PT LONG TERM GOAL #2   Title  Increase cervical moblity and ROM by 5-10 deg in all planes with pain free motion 10/21/17    Time  6    Period  Weeks    Status  On-going      PT LONG TERM GOAL #3   Title  Decrease frequency, intensity and duration of headaches and cervical pain by 50-70% per pt report 10/21/17    Time  6    Period  Weeks    Status  Partially Met painfree for 2 days wk of 11/26.       PT LONG TERM GOAL #4   Title  Independent in HEP 10/21/17    Time  6    Period  Weeks    Status  On-going      PT LONG TERM GOAL #5   Title  Improve FOTO to </= 40% limitation 10/21/17    Time  6    Period  Weeks    Status  On-going            Plan - 10/09/17 1240    Clinical Impression Statement  Pt reporting 2 days of no pain in neck this week; no pain medicine taken in a week.  ROM of cervical spine continues to be limited.  Pt responded well to manual therapy in sidelying (working Rt neck musculature) with less guarding.  Progressing gradually towards therapy goals.     Rehab Potential  Good    PT Frequency  2x / week    PT Duration  6 weeks    PT Treatment/Interventions  Patient/family education;ADLs/Self Care Home Management;Cryotherapy;Electrical Stimulation;Iontophoresis 3m/ml Dexamethasone;Moist Heat;Traction;Ultrasound;Dry needling;Manual techniques;Therapeutic activities;Therapeutic exercise;Neuromuscular re-education    PT Next Visit Plan  Continue manual therapy including thoracic/cervical mobs and deep tissue work. combo UKoreato post Rt neck.     Consulted and Agree with Plan of Care  Patient       Patient will benefit from skilled therapeutic intervention in  order to improve the following deficits and impairments:  Postural dysfunction, Improper body mechanics, Pain, Decreased range of motion, Decreased mobility, Increased fascial restricitons, Increased muscle spasms, Decreased activity  tolerance  Visit Diagnosis: Cervicalgia  Other symptoms and signs involving the musculoskeletal system  Abnormal posture  Other headache syndrome     Problem List Patient Active Problem List   Diagnosis Date Noted  . Neck muscle spasm 08/20/2017  . Neck stiffness 08/20/2017  . Numbness 08/20/2017  . Other headache syndrome 08/20/2017  . CKD stage G3a/A2, GFR 45-59 and albumin creatinine ratio 30-299 mg/g ([place]) 06/03/2017  . Combined forms of age-related cataract of both eyes 05/27/2017  . 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 Posterior vitreous detachment of both eyes 05/27/2017  . Elevated serum creatinine 04/23/2017  . Pure hypercholesterolemia 04/22/2017  . New daily persistent headache 10/22/2016  . Hyperlipidemia 04/22/2016  . Diverticulosis 12/20/2015  . Gastritis and gastroduodenitis 12/20/2015  . Helicobacter pylori gastritis 12/20/2015  . Umbilical hernia 26/41/5830  . DM (diabetes mellitus) ([place]) 11/15/2012  . Metabolic syndrome X 94/05/6807  . GRIEF REACTION, ACUTE 03/27/2010  . ESSENTIAL HYPERTENSION, BENIGN 03/27/2010  . Dyslipidemia 01/19/2010  . Asthma 01/19/2010   Kerin Perna, PTA 10/09/17 12:51 PM  [facility] [place], [place], [address] Phone: [phone]   Fax:  [phone]  Name: Lindsey Fischer MRN: [id number] Date of Birth: 1941/04/26

## 2017-10-13 ENCOUNTER — Ambulatory Visit: Payer: Medicare FFS | Admitting: Physical Therapy

## 2017-10-13 DIAGNOSIS — R29898 Other symptoms and signs involving the musculoskeletal system: Secondary | ICD-10-CM

## 2017-10-13 DIAGNOSIS — M542 Cervicalgia: Secondary | ICD-10-CM | POA: Diagnosis present

## 2017-10-13 DIAGNOSIS — G4489 Other headache syndrome: Secondary | ICD-10-CM

## 2017-10-13 DIAGNOSIS — R293 Abnormal posture: Secondary | ICD-10-CM

## 2017-10-13 NOTE — Therapy (Signed)
Lindsey Fischer [place], [place], [address] Phone: [phone]   Fax:  [phone]  Physical Therapy Treatment  Patient Details  Name: Lindsey Fischer MRN: [id number] Date of Birth: 1940/12/20 Referring Provider: Iran Planas, [facility]   Encounter Date: 10/13/2017  PT End of Session - 10/13/17 1149    Visit Number  8    Number of Visits  12    Date for PT Re-Evaluation  10/21/17    PT Start Time  5631    PT Stop Time  1244    PT Time Calculation (min)  62 min    Activity Tolerance  Patient limited by pain    Behavior During Therapy  [facility] for tasks assessed/performed       Past Medical History:  Diagnosis Date  . Asthma   . Combined forms of age-related cataract of both eyes 05/27/2017  . Diabetes mellitus    pre diabetes  . Hyperlipidemia   . Hypertension   . 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 Posterior vitreous detachment of both eyes 05/27/2017    Past Surgical History:  Procedure Laterality Date  . APPENDECTOMY    . CATARACT EXTRACTION Right   . OVARIAN CYST REMOVAL      There were no vitals filed for this visit.  Subjective Assessment - 10/13/17 1150    Subjective  Pt reports she felt pretty good Thursday.  She cleaned one of her bathrooms thursday afternoon.  Friday her pain in neck and head returned.  She has had to take pain medicine to manage pain.     Patient Stated Goals  get rid of pain if at all possible - tired of the pain     Currently in Pain?  Yes    Pain Score  5     Pain Location  Neck    Pain Orientation  Posterior         [facility] PT Assessment - 10/13/17 0001      Assessment   Medical Diagnosis  Headache; cervical pain     Referring Provider  Iran Planas, [facility]    Onset Date/Surgical Date  02/09/17    Hand Dominance  Right    Next MD Visit  PRN                   [facility] Adult PT Treatment/Exercise - 10/13/17 0001      Neck Exercises: Machines for Strengthening    UBE (Upper Arm Bike)  L1: 1 min backward, 30 sec forward (standing)  pain in neck increased; stopped      Neck Exercises: Supine   Other Supine Exercise  thoracic lifts x 10 reps x 10 sec holds.  some spasming in Rt mid-back; paused to stretch area out.       Shoulder Exercises: Stretch   Other Shoulder Stretches  3 way doorway stretch 30 sec x 2 each, trial of unilateral mid-level doorway stretch x 20 sec        Moist Heat Therapy   Number Minutes Moist Heat  20 Minutes    Moist Heat Location  Cervical thoracic bilat      Electrical Stimulation   Electrical Stimulation Location  Rt cervical musculature    Electrical Stimulation Action  IFC    Electrical Stimulation Parameters  to tolerance     Electrical Stimulation Goals  Pain;Tone      Ultrasound   Ultrasound Location  Rt cervical paraspinals  Ultrasound Parameters  1.2 w/cm2, 8 min, 100%    Ultrasound Goals  Pain;Other (Comment) muscular tightness       Manual Therapy   Manual therapy comments  Pt supine     Soft tissue mobilization  STM to Rt lateral and posterior neck musculature as well as bilat upper trap and levator.  STM to Rt SCM, scalenes.     Myofascial Release  Rt pec, platysma, suboccipital release, upper trap    Manual Traction  cervical traction x 30 sec x 3 reps; cervical traction with Rt scapular depression                    PT Long Term Goals - 10/09/17 1241      PT LONG TERM GOAL #1   Title  Improve posture and alignment with patient to demonstrate improved upright posture engaging posterior shoudler girdle musculature 10/21/17    Time  6    Period  Weeks    Status  On-going      PT LONG TERM GOAL #2   Title  Increase cervical moblity and ROM by 5-10 deg in all planes with pain free motion 10/21/17    Time  6    Period  Weeks    Status  On-going      PT LONG TERM GOAL #3   Title  Decrease frequency, intensity and duration of headaches and cervical pain by 50-70% per pt report  10/21/17    Time  6    Period  Weeks    Status  Partially Met painfree for 2 days wk of 11/26.       PT LONG TERM GOAL #4   Title  Independent in HEP 10/21/17    Time  6    Period  Weeks    Status  On-going      PT LONG TERM GOAL #5   Title  Improve FOTO to </= 40% limitation 10/21/17    Time  6    Period  Weeks    Status  On-going            Plan - 10/13/17 1312    Clinical Impression Statement  Pt presents with a re-flare of symptoms in Rt neck.  She is taking pain medication again.  Rt neck musculature with increased palpable tightness, tenderness.  Pt tearful regarding pain and progress; encouraged pt to continue gentle exercises and modalities at home to decrease pain.  Limited progress towards goals due to recent flare up.    Rehab Potential  Good    PT Frequency  2x / week    PT Duration  6 weeks    PT Treatment/Interventions  Patient/family education;ADLs/Self Care Home Management;Cryotherapy;Electrical Stimulation;Iontophoresis 54m/ml Dexamethasone;Moist Heat;Traction;Ultrasound;Dry needling;Manual techniques;Therapeutic activities;Therapeutic exercise;Neuromuscular re-education    PT Next Visit Plan  Continue manual therapy including thoracic/cervical mobs and deep tissue work. possible DN.     Consulted and Agree with Plan of Care  Patient       Patient will benefit from skilled therapeutic intervention in order to improve the following deficits and impairments:  Postural dysfunction, Improper body mechanics, Pain, Decreased range of motion, Decreased mobility, Increased fascial restricitons, Increased muscle spasms, Decreased activity tolerance  Visit Diagnosis: Cervicalgia  Other symptoms and signs involving the musculoskeletal system  Abnormal posture  Other headache syndrome     Problem List Patient Active Problem List   Diagnosis Date Noted  . Neck muscle spasm 08/20/2017  . Neck stiffness 08/20/2017  .  Numbness 08/20/2017  . Other headache  syndrome 08/20/2017  . CKD stage G3a/A2, GFR 45-59 and albumin creatinine ratio 30-299 mg/g ([place]) 06/03/2017  . Combined forms of age-related cataract of both eyes 05/27/2017  . 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 Posterior vitreous detachment of both eyes 05/27/2017  . Elevated serum creatinine 04/23/2017  . Pure hypercholesterolemia 04/22/2017  . New daily persistent headache 10/22/2016  . Hyperlipidemia 04/22/2016  . Diverticulosis 12/20/2015  . Gastritis and gastroduodenitis 12/20/2015  . Helicobacter pylori gastritis 12/20/2015  . Umbilical hernia 69/24/9324  . DM (diabetes mellitus) ([place]) 11/15/2012  . Metabolic syndrome X 19/91/4445  . GRIEF REACTION, ACUTE 03/27/2010  . ESSENTIAL HYPERTENSION, BENIGN 03/27/2010  . Dyslipidemia 01/19/2010  . Asthma 01/19/2010   Kerin Perna, PTA 10/13/17 1:19 PM  [facility] [place], [place], [address] Phone: [phone]   Fax:  [phone]  Name: Lindsey Fischer MRN: [id number] Date of Birth: [birth year]

## 2017-10-16 ENCOUNTER — Encounter: Payer: Self-pay | Admitting: Rehabilitative and Restorative Service Providers"

## 2017-10-16 ENCOUNTER — Ambulatory Visit: Payer: Medicare FFS | Admitting: Rehabilitative and Restorative Service Providers"

## 2017-10-16 DIAGNOSIS — R293 Abnormal posture: Secondary | ICD-10-CM

## 2017-10-16 DIAGNOSIS — R29898 Other symptoms and signs involving the musculoskeletal system: Secondary | ICD-10-CM | POA: Diagnosis not present

## 2017-10-16 DIAGNOSIS — M542 Cervicalgia: Secondary | ICD-10-CM | POA: Diagnosis present

## 2017-10-16 DIAGNOSIS — G4489 Other headache syndrome: Secondary | ICD-10-CM | POA: Diagnosis not present

## 2017-10-16 NOTE — Therapy (Addendum)
[place], [place], [address] Phone: [phone]   Fax:  [phone]  Physical Therapy Treatment  Patient Details  Name: Lindsey Fischer MRN: [id number] Date of Birth: 12-16-1940 Referring Provider: Iran Planas, PA - C   Encounter Date: 10/16/2017  PT End of Session - 10/16/17 1106    Visit Number  9    Number of Visits  12    Date for PT Re-Evaluation  10/21/17    PT Start Time  1104    PT Stop Time  1216    PT Time Calculation (min)  72 min    Activity Tolerance  Patient tolerated treatment well       Past Medical History:  Diagnosis Date  . Asthma   . Combined forms of age-related cataract of both eyes 05/27/2017  . Diabetes mellitus    pre diabetes  . Hyperlipidemia   . Hypertension   . 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 Posterior vitreous detachment of both eyes 05/27/2017    Past Surgical History:  Procedure Laterality Date  . APPENDECTOMY    . CATARACT EXTRACTION Right   . OVARIAN CYST REMOVAL      There were no vitals filed for this visit.  Subjective Assessment - 10/16/17 1109    Subjective  Frustrated with the neck pain that just won't go away. Some better than it was the last time she was here     Currently in Pain?  Yes    Pain Score  4     Pain Location  Neck    Pain Orientation  Right;Posterior    Pain Descriptors / Indicators  Aching    Pain Type  Chronic pain    Pain Onset  More than a month ago    Pain Frequency  Intermittent         [facility] PT Assessment - 10/16/17 0001      Assessment   Medical Diagnosis  Headache; cervical pain     Referring Provider  Iran Planas, PA - C    Onset Date/Surgical Date  02/09/17    Hand Dominance  Right    Next MD Visit  PRN       AROM   Cervical Flexion  47    Cervical Extension  38    Cervical - Right Side Bend  18    Cervical - Left Side Bend  21    Cervical - Right Rotation  33    Cervical - Left Rotation   28      Palpation   Palpation comment  significant but improving muscular tightness through ant/lat/post cervical musculature; pecs; upper traps; leveator Rt > Lt                   [facility] Adult PT Treatment/Exercise - 10/16/17 0001      Neck Exercises: Seated   Other Seated Exercise  scap squeeze with small noodle along spine PT assisting with scapular retraction 10-20 sec x 5       Neck Exercises: Supine   Other Supine Exercise  axial extension 10 sec x 5       Moist Heat Therapy   Number Minutes Moist Heat  20 Minutes    Moist Heat Location  Cervical thoracic bilat      Electrical Stimulation   Electrical Stimulation Location  Rt posterior lateral cervical musculature    Electrical Stimulation Action  IFC    Electrical Stimulation  Parameters  to tolerance    Electrical Stimulation Goals  Pain;Tone      Ultrasound   Ultrasound Location  Rt posterior lateral cervical musculatutre     Ultrasound Parameters  1.5w/cm2; 1 mHz; 100%; 8 min     Ultrasound Goals  Pain;Other (Comment) muscular tightness       Manual Therapy   Manual therapy comments  Pt sitting and supine     Joint Mobilization  thoracic CPA and lateral mobs in sitting - tight and painful T3/4/5 bilat     Soft tissue mobilization  bilat cervical musculature ant/lat/posterior; pecs; thoracic musculature     Myofascial Release  bilat pecs    Passive ROM  cervical flexion 20-30 sec hold x 3     Manual Traction  prolonged cervical traction for 45-60 sec x 3-4 reps        Trigger Point Dry Needling - 10/16/17 1211    Consent Given?  Yes    Muscles Treated Upper Body  -- lateral cervical scaleni Rt     SubOccipitals Response  Palpable increased muscle length                PT Long Term Goals - 10/16/17 1215      PT LONG TERM GOAL #1   Title  Improve posture and alignment with patient to demonstrate improved upright posture engaging posterior shoudler girdle musculature 10/21/17    Time  6     Period  Weeks    Status  On-going      PT LONG TERM GOAL #2   Title  Increase cervical moblity and ROM by 5-10 deg in all planes with pain free motion 10/21/17    Time  6    Period  Weeks    Status  On-going      PT LONG TERM GOAL #3   Title  Decrease frequency, intensity and duration of headaches and cervical pain by 50-70% per pt report 10/21/17    Time  6    Period  Weeks    Status  Partially Met headaches resolved       PT LONG TERM GOAL #4   Title  Independent in HEP 10/21/17    Time  6    Period  Weeks    Status  On-going      PT LONG TERM GOAL #5   Title  Improve FOTO to </= 40% limitation 10/21/17    Time  6    Period  Weeks    Status  On-going            Plan - 10/16/17 1211    Clinical Impression Statement  Persistent muscular tightness and pain Rt cervical area. Patient has continued poor cervical posture and limited cervical mobilty. She has tightness through the thoracic spine and forward posture with flexion noted through the trunk and hips in standing. Patient has longstanding tightness through the cervical spine. Progress with releaseing this tightness will be gradual with episodic flare ups. Patient did respond well to manual cervical traction and may be a candidate for mechanical traction Will benefit form continued therapy.     Rehab Potential  Good    PT Frequency  2x / week    PT Duration  6 weeks    PT Treatment/Interventions  Patient/family education;ADLs/Self Care Home Management;Cryotherapy;Electrical Stimulation;Iontophoresis 27m/ml Dexamethasone;Moist Heat;Traction;Ultrasound;Dry needling;Manual techniques;Therapeutic activities;Therapeutic exercise;Neuromuscular re-education    PT Next Visit Plan  Continue manual therapy including thoracic/cervical mobs and deep tissue work. assess  responseto DN and continue as indicated; possible trial of mechanical traction.     Consulted and Agree with Plan of Care  Patient       Patient will benefit from  skilled therapeutic intervention in order to improve the following deficits and impairments:  Postural dysfunction, Improper body mechanics, Pain, Decreased range of motion, Decreased mobility, Increased fascial restricitons, Increased muscle spasms, Decreased activity tolerance  Visit Diagnosis: Cervicalgia  Other symptoms and signs involving the musculoskeletal system  Abnormal posture  Other headache syndrome     Problem List Patient Active Problem List   Diagnosis Date Noted  . Neck muscle spasm 08/20/2017  . Neck stiffness 08/20/2017  . Numbness 08/20/2017  . Other headache syndrome 08/20/2017  . CKD stage G3a/A2, GFR 45-59 and albumin creatinine ratio 30-299 mg/g ([place]) 06/03/2017  . Combined forms of age-related cataract of both eyes 05/27/2017  . 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 Posterior vitreous detachment of both eyes 05/27/2017  . Elevated serum creatinine 04/23/2017  . Pure hypercholesterolemia 04/22/2017  . New daily persistent headache 10/22/2016  . Hyperlipidemia 04/22/2016  . Diverticulosis 12/20/2015  . Gastritis and gastroduodenitis 12/20/2015  . Helicobacter pylori gastritis 12/20/2015  . Umbilical hernia 44/51/4604  . DM (diabetes mellitus) ([place]) 11/15/2012  . Metabolic syndrome X 79/98/7215  . GRIEF REACTION, ACUTE 03/27/2010  . ESSENTIAL HYPERTENSION, BENIGN 03/27/2010  . Dyslipidemia 01/19/2010  . Asthma 01/19/2010    Otilio Groleau Nilda Simmer PT, MPH  10/16/2017, 12:19 PM  [facility] [place], [place], [address] Phone: [phone]   Fax:  [phone]  Name: Lindsey Fischer MRN: [id number] Date of Birth: 1941/06/30  PHYSICAL THERAPY DISCHARGE SUMMARY  Visits from Start of Care: 9  Current functional level related to goals / functional outcomes: See last progress note for discharge status   Remaining deficits: [place] reports resolution of headaches but continued intermittent  neck pain and tightness    Education / Equipment: HEP  Plan: Patient agrees to discharge.  Patient goals were partially met. Patient is being discharged due to not returning since the last visit.  ?????    Thresa Dozier P. Helene Kelp PT, MPH 11/13/17 11:40 AM

## 2017-10-21 ENCOUNTER — Encounter: Payer: Medicare FFS | Admitting: Rehabilitative and Restorative Service Providers"

## 2017-10-30 ENCOUNTER — Telehealth: Payer: Self-pay | Admitting: Physical Therapy

## 2017-10-30 NOTE — Telephone Encounter (Signed)
Called and left message for patient. Inquired how she is doing and if she is interested in continuation of physical therapy vs d/c.  Left number for her to return my call.   Kerin Perna, PTA 10/30/17 3:49 PM

## 2017-11-30 ENCOUNTER — Emergency Department
Admission: EM | Admit: 2017-11-30 | Discharge: 2017-11-30 | Disposition: A | Discharge status: Home / Self Care | Payer: Medicare FFS | Source: Home / Self Care

## 2017-11-30 ENCOUNTER — Encounter: Payer: Self-pay | Admitting: Emergency Medicine

## 2017-11-30 ENCOUNTER — Emergency Department (INDEPENDENT_AMBULATORY_CARE_PROVIDER_SITE_OTHER): Payer: Medicare FFS

## 2017-11-30 DIAGNOSIS — R05 Cough: Secondary | ICD-10-CM

## 2017-11-30 DIAGNOSIS — R0602 Shortness of breath: Secondary | ICD-10-CM | POA: Diagnosis not present

## 2017-11-30 DIAGNOSIS — J189 Pneumonia, unspecified organism: Secondary | ICD-10-CM

## 2017-11-30 MED ORDER — AZITHROMYCIN 250 MG PO TABS: 250.0000 mg | ORAL_TABLET | Freq: Every day | ORAL | 0 refills | Status: DC

## 2017-11-30 MED ORDER — CEFTRIAXONE SODIUM 1 G IJ SOLR
1.0000 g | Freq: Once | INTRAMUSCULAR | Status: AC
  Administered 2017-11-30: 1 g via INTRAMUSCULAR

## 2017-11-30 MED ORDER — DEXTROSE 5 % IV SOLN: 1.0000 g | Freq: Once | INTRAVENOUS | Status: DC

## 2017-11-30 NOTE — ED Provider Notes (Signed)
[name]    CSN: [id number] Arrival date & time: 11/30/17  1648     History   Chief Complaint Chief Complaint  Patient presents with  . Cough    HPI Lindsey Fischer is a 77 y.o. female.   The history is provided by the patient. No language interpreter was used.  Cough  Cough characteristics:  Productive Sputum characteristics:  Nondescript Severity:  Moderate Onset quality:  Gradual Duration:  3 days Timing:  Constant Progression:  Worsening Chronicity:  New Smoker: no   Relieved by:  Nothing Worsened by:  Nothing Ineffective treatments:  None tried Associated symptoms: chest pain   Pt complains of pain in her chest with coughing.  Pt reports congestion  Past Medical History:  Diagnosis Date  . Asthma   . Combined forms of age-related cataract of both eyes 05/27/2017  . Diabetes mellitus    pre diabetes  . Hyperlipidemia   . Hypertension   . 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 Posterior vitreous detachment of both eyes 05/27/2017    Patient Active Problem List   Diagnosis Date Noted  . Neck muscle spasm 08/20/2017  . Neck stiffness 08/20/2017  . Numbness 08/20/2017  . Other headache syndrome 08/20/2017  . CKD stage G3a/A2, GFR 45-59 and albumin creatinine ratio 30-299 mg/g ([place]) 06/03/2017  . Combined forms of age-related cataract of both eyes 05/27/2017  . 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 Posterior vitreous detachment of both eyes 05/27/2017  . Elevated serum creatinine 04/23/2017  . Pure hypercholesterolemia 04/22/2017  . New daily persistent headache 10/22/2016  . Hyperlipidemia 04/22/2016  . Diverticulosis 12/20/2015  . Gastritis and gastroduodenitis 12/20/2015  . Helicobacter pylori gastritis 12/20/2015  . Umbilical hernia 42/70/6237  . DM (diabetes mellitus) ([place]) 11/15/2012  . Metabolic syndrome X 62/83/1517  . GRIEF REACTION, ACUTE 03/27/2010  . ESSENTIAL HYPERTENSION, BENIGN 03/27/2010  . Dyslipidemia 01/19/2010  .  Asthma 01/19/2010    Past Surgical History:  Procedure Laterality Date  . APPENDECTOMY    . CATARACT EXTRACTION Right   . OVARIAN CYST REMOVAL      OB History    No data available       Home Medications    Prior to Admission medications   Medication Sig Start Date End Date Taking? Authorizing Provider  ACCU-CHEK AVIVA PLUS test strip USE TO TEST BLOOD SUGAR TWICE A DAY 10/07/17   [name], [name], PA-C  AMBULATORY NON FORMULARY MEDICATION Nebulizer machine and tubing  Dx:  COPD exacerbation 05/06/11   Bowen, Collene Leyden, DO  AMBULATORY NON FORMULARY MEDICATION 2 (two) times daily. Osteobiflex    [provider]  AMBULATORY NON FORMULARY MEDICATION Accu-Check Aviva Plus test strips Check blood sugar twice a day Dx: E11.8 02/25/17   [name], [name], PA-C  baclofen (LIORESAL) 10 MG tablet Take 1/2 to 1 tablet up to twice a day. 07/23/17   [name], Luvenia Starch L, PA-C  diclofenac sodium (VOLTAREN) 1 % GEL Apply 4 g topically 4 (four) times daily. 08/20/17   [name], [name], PA-C  ferrous sulfate 325 (65 FE) MG tablet Take 325 mg by mouth daily with breakfast.    [provider]  Fluticasone-Salmeterol (ADVAIR DISKUS) 250-50 MCG/DOSE AEPB inhale 1 puff INTO THE LUNGS every 12 hours 04/22/17   [name], [name], PA-C  ipratropium-albuterol (DUONEB) 0.5-2.5 (3) MG/3ML SOLN Take 3 mLs by nebulization every 6 (six) hours as needed. 12/11/15   [name], [name], PA-C  lisinopril-hydrochlorothiazide (PRINZIDE,ZESTORETIC) 20-25  MG tablet take 1 tablet by mouth once daily 04/23/17   [name], [name], PA-C  magnesium 30 MG tablet Take 30 mg by mouth daily.    [provider]  metFORMIN (GLUCOPHAGE) 500 MG tablet Take 1 tablet (500 mg total) by mouth daily with breakfast. 07/23/17   [name], [name], PA-C  pravastatin (PRAVACHOL) 40 MG tablet Take 1 tablet (40 mg total) by mouth daily. 04/22/17   [name], PA-C  VENTOLIN HFA 108 (90 Base) MCG/ACT inhaler inhale 2 puffs by mouth  once daily as directed 04/30/17   [name], PA-C    Family History Family History  Problem Relation Age of Onset  . Diabetes Mother   . Heart attack Father 68       deceased  . Cancer Sister        brain tumor  . Cancer Brother        prostrate and lung CA  . COPD Sister     Social History Social History   Tobacco Use  . Smoking status: Former Smoker    Last attempt to quit: 11/11/1980    Years since quitting: 37.0  . Smokeless tobacco: Never Used  Substance Use Topics  . Alcohol use: No  . Drug use: No     Allergies   Livalo [pitavastatin]; Mucinex [guaifenesin er]; Peanut-containing drug products; Pravastatin; Prednisone; and Zocor [simvastatin]   Review of Systems Review of Systems  Respiratory: Positive for cough.   Cardiovascular: Positive for chest pain.  All other systems reviewed and are negative.    Physical Exam Triage Vital Signs ED Triage Vitals  Enc Vitals Group     BP 11/30/17 1735 (!) 170/72     Pulse Rate 11/30/17 1735 82     Resp --      Temp 11/30/17 1735 98.5 F (36.9 C)     Temp Source 11/30/17 1735 Oral     SpO2 11/30/17 1735 95 %     Weight 11/30/17 1735 166 lb 8 oz (75.5 kg)     Height 11/30/17 1735 5\' 3"  (1.6 m)     Head Circumference --      Peak Flow --      Pain Score 11/30/17 1736 10     Pain Loc --      Pain Edu? --      Excl. in [place]? --    No data found.  Updated Vital Signs BP (!) 170/72 (BP Location: Right Arm)   Pulse 82   Temp 98.5 F (36.9 C) (Oral)   Ht 5\' 3"  (1.6 m)   Wt 166 lb 8 oz (75.5 kg)   SpO2 95%   BMI 29.49 kg/m   Visual Acuity Right Eye Distance:   Left Eye Distance:   Bilateral Distance:    Right Eye Near:   Left Eye Near:    Bilateral Near:     Physical Exam  Constitutional: She appears well-developed and well-nourished. No distress.  HENT:  Head: Normocephalic and atraumatic.  Right Ear: External ear normal.  Left Ear: External ear normal.  Nose: Nose normal.  Mouth/Throat:  Oropharynx is clear and moist.  Eyes: Conjunctivae are normal.  Neck: Neck supple.  Cardiovascular: Normal rate and regular rhythm.  No murmur heard. Pulmonary/Chest: Effort normal and breath sounds normal. No respiratory distress.  rhonchi  Abdominal: Soft. There is no tenderness.  Musculoskeletal: She exhibits no edema.  Neurological: She is alert.  Skin: Skin is warm and dry.  Psychiatric: She has a normal mood and affect.  Nursing note and vitals reviewed.    UC Treatments / Results  Labs (all labs ordered are listed, but only abnormal results are displayed) Labs Reviewed - No data to display  EKG  EKG Interpretation None       Radiology No results found.  Procedures Procedures (including critical care time)  Medications Ordered in UC Medications - No data to display   Initial Impression / Assessment and Plan / UC Course  I have reviewed the triage vital signs and the nursing notes.  Pertinent labs & imaging results that were available during my care of the patient were reviewed by me and considered in my medical decision making (see chart for details).    Pt given Rocephin Im     Final Clinical Impressions(s) / UC Diagnoses   Final diagnoses:  Community acquired pneumonia, unspecified laterality    ED Discharge Orders        Ordered    azithromycin (ZITHROMAX) 250 MG tablet  Daily     11/30/17 1829       Controlled Substance Prescriptions [place] Controlled Substance Registry consulted? Not Applicable   Fransico Meadow, [place] 11/30/17 8381

## 2017-11-30 NOTE — Discharge Instructions (Signed)
See your Provider for recheck in 2-3 days

## 2017-11-30 NOTE — ED Triage Notes (Signed)
Patient complaining of possible pneumonia, productive cough x 3 days, side hurts, ran a fever of 100.9, history of asthma, SOB

## 2017-12-08 ENCOUNTER — Ambulatory Visit: Payer: Medicare FFS | Admitting: Physician Assistant

## 2017-12-08 ENCOUNTER — Encounter: Payer: Self-pay | Admitting: Physician Assistant

## 2017-12-08 VITALS — BP 130/58 | HR 75 | Temp 98.0°F | Resp 16 | Wt 166.0 lb

## 2017-12-08 DIAGNOSIS — J189 Pneumonia, unspecified organism: Secondary | ICD-10-CM | POA: Diagnosis not present

## 2017-12-08 NOTE — Patient Instructions (Signed)
Adhesive Capsulitis Adhesive capsulitis is inflammation of the tendons and ligaments that surround the shoulder joint (shoulder capsule). This condition causes the shoulder to become stiff and painful to move. Adhesive capsulitis is also called frozen shoulder. What are the causes? This condition may be caused by:  An injury to the shoulder joint.  Straining the shoulder.  Not moving the shoulder for a period of time. This can happen if your arm was injured or in a sling.  Long-standing health problems, such as: ? Diabetes. ? Thyroid problems. ? Heart disease. ? Stroke. ? Rheumatoid arthritis. ? Lung disease.  In some cases, the cause may not be known. What increases the risk? This condition is more likely to develop in:  Women.  People who are older than 77 years of age.  What are the signs or symptoms? Symptoms of this condition include:  Pain in the shoulder when moving the arm. There may also be pain when parts of the shoulder are touched. The pain is worse at night or when at rest.  Soreness or aching in the shoulder.  Inability to move the shoulder normally.  Muscle spasms.  How is this diagnosed? This condition is diagnosed with a physical exam and imaging tests, such as an X-ray or MRI. How is this treated? This condition may be treated with:  Treatment of the underlying cause or condition.  Physical therapy. This involves performing exercises to get the shoulder moving again.  Medicine. Medicine may be given to relieve pain, inflammation, or muscle spasms.  Steroid injections into the shoulder joint.  Shoulder manipulation. This is a procedure to move the shoulder into another position. It is done after you are given a medicine to make you fall asleep (general anesthetic). The joint may also be injected with salt water at high pressure to break down scarring.  Surgery. This may be done in severe cases when other treatments have failed.  Although most  people recover completely from adhesive capsulitis, some may not regain the full movement of the shoulder. Follow these instructions at home:  Take over-the-counter and prescription medicines only as told by your health care provider.  If you are being treated with physical therapy, follow instructions from your physical therapist.  Avoid exercises that put a lot of demand on your shoulder, such as throwing. These exercises can make pain worse.  If directed, apply ice to the injured area: ? Put ice in a plastic bag. ? Place a towel between your skin and the bag. ? Leave the ice on for 20 minutes, 2-3 times per day. Contact a health care provider if:  You develop new symptoms.  Your symptoms get worse. This information is not intended to replace advice given to you by your health care provider. Make sure you discuss any questions you have with your health care provider. Document Released: 08/25/2009 Document Revised: 04/04/2016 Document Reviewed: 02/20/2015 Elsevier Interactive Patient Education  2018 Elsevier Inc.  

## 2017-12-08 NOTE — Progress Notes (Signed)
   Subjective:    Patient ID: Lindsey Fischer, female    DOB: 02-17-1941, 77 y.o.   MRN: [id number]  HPI  Pt is a 77 yo female who presents to the clinic to follow up on CAP dx on 11/30/17 in UC and given zpak after pneumonia confirmed by CXR. She then went to ED on 12/04/17 after having pain in right lung, coughing increased and became dizzy. CT of head/MRI/CTA that were all negative. Ultimately zpak was replaced with doxycyline and sent home. She is doing much better and ready to go back to work. No fever, chills, body aches. She does have a mild cough. No SOB or wheezing.   .. Active Ambulatory Problems    Diagnosis Date Noted  . Dyslipidemia 01/19/2010  . GRIEF REACTION, ACUTE 03/27/2010  . ESSENTIAL HYPERTENSION, BENIGN 03/27/2010  . Asthma 01/19/2010  . Metabolic syndrome X 95/63/8756  . DM (diabetes mellitus) ([place]) 11/15/2012  . Umbilical hernia 43/32/9518  . Diverticulosis 12/20/2015  . Gastritis and gastroduodenitis 12/20/2015  . Helicobacter pylori gastritis 12/20/2015  . Hyperlipidemia 04/22/2016  . New daily persistent headache 10/22/2016  . Pure hypercholesterolemia 04/22/2017  . Elevated serum creatinine 04/23/2017  . Combined forms of age-related cataract of both eyes 05/27/2017  . 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 Posterior vitreous detachment of both eyes 05/27/2017  . 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 Neck muscle spasm 08/20/2017  . Neck stiffness 08/20/2017  . Numbness 08/20/2017  . Other headache syndrome 08/20/2017   Resolved Ambulatory Problems    Diagnosis Date Noted  . Impaired fasting glucose 03/27/2010  . Hypoxemia 05/22/2010  . Pneumonia 05/06/2011  . Epigastric abdominal tenderness with rebound tenderness 12/19/2015  . Non-intractable cyclical vomiting with nausea 12/19/2015  . Abdominal guarding 12/19/2015  . Diarrhea 12/19/2015  . Atypical chest pain 10/22/2016   Past Medical History:   Diagnosis Date  . Asthma   . Combined forms of age-related cataract of both eyes 05/27/2017  . Diabetes mellitus   . Hyperlipidemia   . Hypertension   . 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 Posterior vitreous detachment of both eyes 05/27/2017           Review of Systems  All other systems reviewed and are negative.      Objective:   Physical Exam  Constitutional: She is oriented to person, place, and time. She appears well-developed and well-nourished.  HENT:  Head: Normocephalic and atraumatic.  Neck: Normal range of motion. Neck supple.  Cardiovascular: Normal rate, regular rhythm and normal heart sounds.  Pulmonary/Chest: Effort normal and breath sounds normal.  Lymphadenopathy:    She has no cervical adenopathy.  Neurological: She is alert and oriented to person, place, and time.  Psychiatric: She has a normal mood and affect. Her behavior is normal.          Assessment & Plan:  Marland KitchenMarland KitchenRheagan was seen today for hospitalization follow-up.  Diagnoses and all orders for this visit:  Community acquired pneumonia, unspecified laterality  finish Doxycycline. Continue to use albuterol/duoneb as needed. Vitals looks great. Follow up as needed.

## 2017-12-09 ENCOUNTER — Encounter: Payer: Self-pay | Admitting: Physician Assistant

## 2018-01-09 ENCOUNTER — Telehealth: Payer: Self-pay | Admitting: Physician Assistant

## 2018-01-09 NOTE — Telephone Encounter (Signed)
Patient still has a couple of the inhalers left and she does not need a refill at this time. Per patient her pharmacy has been changed to [facility] in [place] in the chart. Patient will have the pharmacy contact our office when she is due for a refill.

## 2018-01-09 NOTE — Telephone Encounter (Signed)
Left message for patient to call back about her inhaler.

## 2018-01-12 NOTE — Telephone Encounter (Signed)
Received fax from [facility] that Ventolin was covered under the patients insurance and does not need a PA. Approved from 01/08/2018 until 9999 or as long as the member does not change insurance.  Reference: [id number].

## 2018-01-16 ENCOUNTER — Encounter: Payer: Self-pay | Admitting: Physician Assistant

## 2018-01-16 ENCOUNTER — Encounter: Payer: Self-pay | Admitting: Family Medicine

## 2018-01-16 ENCOUNTER — Ambulatory Visit: Payer: Medicare FFS | Admitting: Family Medicine

## 2018-01-16 ENCOUNTER — Ambulatory Visit: Payer: Medicare FFS | Admitting: Physician Assistant

## 2018-01-16 VITALS — BP 153/66 | HR 71 | Ht 62.99 in | Wt 168.0 lb

## 2018-01-16 VITALS — BP 150/78 | HR 76 | Ht 63.0 in | Wt 168.0 lb

## 2018-01-16 DIAGNOSIS — E118 Type 2 diabetes mellitus with unspecified complications: Secondary | ICD-10-CM | POA: Diagnosis not present

## 2018-01-16 DIAGNOSIS — J4520 Mild intermittent asthma, uncomplicated: Secondary | ICD-10-CM

## 2018-01-16 DIAGNOSIS — I10 Essential (primary) hypertension: Secondary | ICD-10-CM | POA: Diagnosis not present

## 2018-01-16 DIAGNOSIS — M542 Cervicalgia: Secondary | ICD-10-CM

## 2018-01-16 DIAGNOSIS — G8929 Other chronic pain: Secondary | ICD-10-CM

## 2018-01-16 DIAGNOSIS — N183 Chronic kidney disease, stage 3 (moderate): Secondary | ICD-10-CM

## 2018-01-16 DIAGNOSIS — E7800 Pure hypercholesterolemia, unspecified: Secondary | ICD-10-CM

## 2018-01-16 DIAGNOSIS — N1831 Chronic kidney disease, stage 3a: Secondary | ICD-10-CM

## 2018-01-16 MED ORDER — LISINOPRIL-HYDROCHLOROTHIAZIDE 20-25 MG PO TABS: 1.0000 | ORAL_TABLET | Freq: Every day | ORAL | 5 refills | Status: DC

## 2018-01-16 MED ORDER — FLUTICASONE-SALMETEROL 250-50 MCG/DOSE IN AEPB: INHALATION_SPRAY | RESPIRATORY_TRACT | 3 refills | Status: DC

## 2018-01-16 MED ORDER — TRAMADOL HCL 50 MG PO TABS: 50.0000 mg | ORAL_TABLET | Freq: Three times a day (TID) | ORAL | 0 refills | Status: DC | PRN

## 2018-01-16 MED ORDER — PRAVASTATIN SODIUM 40 MG PO TABS: 40.0000 mg | ORAL_TABLET | Freq: Every day | ORAL | 1 refills | Status: DC

## 2018-01-16 MED ORDER — METFORMIN HCL 500 MG PO TABS: 500.0000 mg | ORAL_TABLET | Freq: Every day | ORAL | 1 refills | Status: DC

## 2018-01-16 NOTE — Patient Instructions (Signed)
Thank you for coming in today. Continue heating pad, TENS unit, tramadol and voltaren gel.  You should hear about MRI soon.  Let me know if you do not hear anything.  Recheck with me a few days after the MRI.

## 2018-01-16 NOTE — Progress Notes (Signed)
Subjective:    Patient ID: Lindsey Fischer, female    DOB: 1941/05/21, 77 y.o.   MRN: [id number]  HPI  Pt is a 77 yo female with DM type II, HTN, hyperlipidemia who presents to the clinic for 6 month follow up.   DM- checking sugars in the am and usually around 110-120 but for last few morning been 136. On metformin only. No hypoglycemia. No open sores or wounds. Doing well overall.   She continues to have persistent neck pain more to the right than left. She has tried PT with minimal relief. Dry needling has not really helped at all. accunpunture not helped at all. chiropractor has not helped at all. He did do xrays and saw some cervical DDD. No known injury. voltaren given due to CkD. baclofren did not help. She has a tens unit but does not seem to help.   .. Active Ambulatory Problems    Diagnosis Date Noted  . Dyslipidemia 01/19/2010  . GRIEF REACTION, ACUTE 03/27/2010  . ESSENTIAL HYPERTENSION, BENIGN 03/27/2010  . Asthma 01/19/2010  . Metabolic syndrome X 53/66/4403  . DM (diabetes mellitus) ([place]) 11/15/2012  . Umbilical hernia 47/42/5956  . Diverticulosis 12/20/2015  . Gastritis and gastroduodenitis 12/20/2015  . Helicobacter pylori gastritis 12/20/2015  . Hyperlipidemia 04/22/2016  . New daily persistent headache 10/22/2016  . Pure hypercholesterolemia 04/22/2017  . Elevated serum creatinine 04/23/2017  . Combined forms of age-related cataract of both eyes 05/27/2017  . 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 Posterior vitreous detachment of both eyes 05/27/2017  . CKD stage G3a/A2, GFR 45-59 and albumin creatinine ratio 30-299 mg/g ([place]) 06/03/2017  . Neck muscle spasm 08/20/2017  . Neck stiffness 08/20/2017  . Numbness 08/20/2017  . Other headache syndrome 08/20/2017  . Chronic neck pain 01/16/2018   Resolved Ambulatory Problems    Diagnosis Date Noted  . Impaired fasting glucose 03/27/2010  . Hypoxemia 05/22/2010  . Pneumonia 05/06/2011  . Epigastric abdominal  tenderness with rebound tenderness 12/19/2015  . Non-intractable cyclical vomiting with nausea 12/19/2015  . Abdominal guarding 12/19/2015  . Diarrhea 12/19/2015  . Atypical chest pain 10/22/2016   Past Medical History:  Diagnosis Date  . Asthma   . Combined forms of age-related cataract of both eyes 05/27/2017  . Diabetes mellitus   . Hyperlipidemia   . Hypertension   . 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 Posterior vitreous detachment of both eyes 05/27/2017     Review of Systems See HPI.     Objective:   Physical Exam  Constitutional: She is oriented to person, place, and time. She appears well-developed and well-nourished.  HENT:  Head: Normocephalic and atraumatic.  Neck:  Decreased ROM of neck. Tenderness over left cervical paraspinal muscles. No tenderness over cspine.   Cardiovascular: Normal rate, regular rhythm and normal heart sounds.  Pulmonary/Chest: Effort normal and breath sounds normal.  Neurological: She is alert and oriented to person, place, and time.  Skin: No rash noted.  Psychiatric: She has a normal mood and affect. Her behavior is normal.          Assessment & Plan:  Marland KitchenMarland KitchenDiagnoses and all orders for this visit:  Essential hypertension, benign -     lisinopril-hydrochlorothiazide (PRINZIDE,ZESTORETIC) 20-25 MG tablet; Take 1 tablet by mouth daily.  Mild intermittent asthma without complication -     Fluticasone-Salmeterol (ADVAIR DISKUS) 250-50 MCG/DOSE AEPB; inhale 1 puff INTO THE LUNGS every 12 hours  Type 2 diabetes mellitus with complication, without long-term  current use of insulin (HCC) -     Hemoglobin A1c -     metFORMIN (GLUCOPHAGE) 500 MG tablet; Take 1 tablet (500 mg total) by mouth daily with breakfast.  CKD stage G3a/A2, GFR 45-59 and albumin creatinine ratio 30-299 mg/g ([facility]) -     COMPLETE METABOLIC PANEL WITH GFR  Pure hypercholesterolemia -     pravastatin (PRAVACHOL) 40 MG tablet; Take 1 tablet (40 mg total) by mouth  daily.  Neck pain -     traMADol (ULTRAM) 50 MG tablet; Take 1 tablet (50 mg total) by mouth every 8 (eight) hours as needed.   BP up but likely due to pain. Dr. Georgina Snell had an opening for sports medicine. No xrays here to read.  Pt will see him for neck pain. Tramadol given for moderate to severe pain as needed. Sedation warning given. No concerns on [place] controlled substance database.   A!C to be ordered and adjusted in labs. For now refills sent.   Follow up in 3 months.

## 2018-01-16 NOTE — Progress Notes (Signed)
Subjective:    I'm seeing this patient as a consultation for:  [name], PA-C   CC: Neck Pain  HPI: Lindsey Fischer notes a one year history of right lateral and posterior neck pain.  The pain is been ongoing without injury.  She denies any radiating pain weakness or numbness.  She has had multiple different interventions including a dedicated trial of formal physical therapy for greater than 6 weeks.  Additionally she has had extensive chiropractic care.  She additionally has had medication management including muscle relaxers and prescription strength NSAIDs and tramadol which have not helped very much.  She denies significant radiating pain weakness or numbness.  She notes that she had an x-ray at the chiropractor's office of her cervical spine who diagnosed her with some degenerative disc disease.  She notes the pain is daily moderate to severe and interfering with activity.  She has trouble gardening and taking care of things at home because of the bothersome neck pain.  Past medical history, Surgical history, Family history not pertinant except as noted below, Social history, Allergies, and medications have been entered into the medical record, reviewed, and no changes needed.   Review of Systems: No headache, visual changes, nausea, vomiting, diarrhea, constipation, dizziness, abdominal pain, skin rash, fevers, chills, night sweats, weight loss, swollen lymph nodes, body aches, joint swelling, muscle aches, chest pain, shortness of breath, mood changes, visual or auditory hallucinations.   Objective:    Vitals:   01/16/18 0912  BP: (!) 153/66  Pulse: 71   General: Well Developed, well nourished, and in no acute distress.  Neuro/Psych: Alert and oriented x3, extra-ocular muscles intact, able to move all 4 extremities, sensation grossly intact. Skin: Warm and dry, no rashes noted.  Respiratory: Not using accessory muscles, speaking in full sentences, trachea midline.  Cardiovascular:  Pulses palpable, no extremity edema. Abdomen: Does not appear distended. MSK:  C-spine: Nontender to spinal midline.  Tender to palpation right cervical paraspinal muscle group. Range of motion limited by pain especially in right rotation and lateral flexion Upper extremity strength is equal and normal throughout Upper and lower extremities are equal normal throughout Sensation is intact in both upper and lower extremities.    Impression and Recommendations:    Assessment and Plan: 77 y.o. female with chronic cervical spine pain without radicular symptoms failing conservative management.  Patient has had a trial of adequate conservative management including at least 6 weeks of physical therapy, chiropractic care, reasonable prescription medications.  Additionally she is used a TENS unit and heating pad.  At this point the pain is becoming disabling.  Plan to proceed with MRI to evaluate the cause of pain more thoroughly and for potential injection planning.  Recheck after MRI.   Orders Placed This Encounter  Procedures  . [name]    Standing Status:   Future    Standing Expiration Date:   03/19/2019    Order Specific Question:   What is the patient's sedation requirement?    Answer:   No Sedation    Order Specific Question:   Does the patient have a pacemaker or implanted devices?    Answer:   No    Order Specific Question:   Preferred imaging location?    Answer:   Product/process development scientist (table limit-350lbs)    Order Specific Question:   Radiology Contrast Protocol - do NOT remove file path    Answer:   [email or web address]   No orders of the  defined types were placed in this encounter.   Discussed warning signs or symptoms. Please see discharge instructions. Patient expresses understanding.

## 2018-01-16 NOTE — Patient Instructions (Signed)
See Dr. Georgina Snell today.

## 2018-01-17 LAB — COMPLETE METABOLIC PANEL WITH GFR
AG RATIO: 1.7 (calc) (ref 1.0–2.5)
ALT: 17 U/L (ref 6–29)
AST: 19 U/L (ref 10–35)
Albumin: 4.3 g/dL (ref 3.6–5.1)
Alkaline phosphatase (APISO): 53 U/L (ref 33–130)
BILIRUBIN TOTAL: 0.5 mg/dL (ref 0.2–1.2)
BUN: 22 mg/dL (ref 7–25)
CALCIUM: 9.7 mg/dL (ref 8.6–10.4)
CHLORIDE: 102 mmol/L (ref 98–110)
CO2: 28 mmol/L (ref 20–32)
Creat: 0.92 mg/dL (ref 0.60–0.93)
GFR, Est African American: 70 mL/min/{1.73_m2} (ref >=60)
GFR, Est Non African American: 60 mL/min/{1.73_m2} (ref >=60)
GLUCOSE: 136 mg/dL — AB (ref 65–99)
Globulin: 2.6 g/dL (calc) (ref 1.9–3.7)
POTASSIUM: 4.2 mmol/L (ref 3.5–5.3)
Sodium: 138 mmol/L (ref 135–146)
TOTAL PROTEIN: 6.9 g/dL (ref 6.1–8.1)

## 2018-01-17 LAB — HEMOGLOBIN A1C
EAG (MMOL/L): 7.9 (calc)
Hgb A1c MFr Bld: 6.6 % of total Hgb — ABNORMAL HIGH (ref <5.7)
Mean Plasma Glucose: 143 (calc)

## 2018-01-18 ENCOUNTER — Encounter: Payer: Self-pay | Admitting: Physician Assistant

## 2018-01-18 NOTE — Progress Notes (Signed)
Call pt: kidneys look great. A!C is up today from 6.1. No need to make any medication adjustments just need to watch carbs and sugars in diet. Continue on metformin. Recheck in 3 months.

## 2018-01-26 ENCOUNTER — Ambulatory Visit (INDEPENDENT_AMBULATORY_CARE_PROVIDER_SITE_OTHER): Payer: Medicare FFS

## 2018-01-26 DIAGNOSIS — M4322 Fusion of spine, cervical region: Secondary | ICD-10-CM

## 2018-01-26 DIAGNOSIS — M4682 Other specified inflammatory spondylopathies, cervical region: Secondary | ICD-10-CM

## 2018-01-26 DIAGNOSIS — M542 Cervicalgia: Secondary | ICD-10-CM

## 2018-01-26 DIAGNOSIS — M4802 Spinal stenosis, cervical region: Secondary | ICD-10-CM

## 2018-01-29 ENCOUNTER — Encounter: Payer: Self-pay | Admitting: Family Medicine

## 2018-01-29 ENCOUNTER — Ambulatory Visit: Payer: Medicare FFS | Admitting: Family Medicine

## 2018-01-29 ENCOUNTER — Telehealth: Payer: Self-pay | Admitting: Family Medicine

## 2018-01-29 VITALS — BP 146/74 | HR 79 | Ht 63.0 in | Wt 167.0 lb

## 2018-01-29 DIAGNOSIS — M4692 Unspecified inflammatory spondylopathy, cervical region: Secondary | ICD-10-CM

## 2018-01-29 DIAGNOSIS — M5412 Radiculopathy, cervical region: Secondary | ICD-10-CM

## 2018-01-29 DIAGNOSIS — G894 Chronic pain syndrome: Secondary | ICD-10-CM

## 2018-01-29 DIAGNOSIS — M47812 Spondylosis without myelopathy or radiculopathy, cervical region: Secondary | ICD-10-CM | POA: Insufficient documentation

## 2018-01-29 MED ORDER — HYDROCODONE-ACETAMINOPHEN 5-325 MG PO TABS: 1.0000 | ORAL_TABLET | Freq: Four times a day (QID) | ORAL | 0 refills | Status: DC | PRN

## 2018-01-29 NOTE — Progress Notes (Signed)
Lindsey Fischer is a 77 y.o. female who presents to [facility] today for neck pain and MRI follow-up.  Lindsey Fischer was seen March 8 for evaluation for her chronic neck pain.  At that point she had failed a adequate trial of conservative management included dedicated physical therapy and chiropractic care.  Her pain is quite severe at times and significantly impacting her quality of life.  She notes the pain is confined mostly to the right lateral upper neck.  She points to an area behind her ear on the right side of her neck notes this is the most painful area worse with motion.  She notes that she is been prescribed tramadol which does not help much at all.  She is only sleeping about 4 hours a night due to the pain.    Additionally she does have some pain in the right shoulder at the improves with overhead motion.  She denies any pain radiating below the level of her upper arm.   Past Medical History:  Diagnosis Date  . Asthma   . Combined forms of age-related cataract of both eyes 05/27/2017  . Diabetes mellitus    pre diabetes  . Hyperlipidemia   . Hypertension   . 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 Posterior vitreous detachment of both eyes 05/27/2017   Past Surgical History:  Procedure Laterality Date  . APPENDECTOMY    . CATARACT EXTRACTION Right   . OVARIAN CYST REMOVAL     Social History   Tobacco Use  . Smoking status: Former Smoker    Last attempt to quit: 11/11/1980    Years since quitting: 37.2  . Smokeless tobacco: Never Used  Substance Use Topics  . Alcohol use: No     ROS:  As above   Medications: Current Outpatient Medications  Medication Sig Dispense Refill  . ACCU-CHEK AVIVA PLUS test strip USE TO TEST BLOOD SUGAR TWICE A DAY 100 each 2  . AMBULATORY NON FORMULARY MEDICATION Nebulizer machine and tubing  Dx:  COPD exacerbation 1 Units 0  . AMBULATORY NON FORMULARY MEDICATION 2 (two) times daily. Osteobiflex     . AMBULATORY NON FORMULARY MEDICATION Accu-Check Aviva Plus test strips Check blood sugar twice a day Dx: E11.8 100 each 1  . baclofen (LIORESAL) 10 MG tablet Take 1/2 to 1 tablet up to twice a day. 30 each 0  . diclofenac sodium (VOLTAREN) 1 % GEL Apply 4 g topically 4 (four) times daily. 1 Tube 1  . ferrous sulfate 325 (65 FE) MG tablet Take 325 mg by mouth daily with breakfast.    . Fluticasone-Salmeterol (ADVAIR DISKUS) 250-50 MCG/DOSE AEPB inhale 1 puff INTO THE LUNGS every 12 hours 180 each 3  . ipratropium-albuterol (DUONEB) 0.5-2.5 (3) MG/3ML SOLN Take 3 mLs by nebulization every 6 (six) hours as needed. 360 mL 0  . lisinopril-hydrochlorothiazide (PRINZIDE,ZESTORETIC) 20-25 MG tablet Take 1 tablet by mouth daily. 30 tablet 5  . magnesium 30 MG tablet Take 30 mg by mouth daily.    . metFORMIN (GLUCOPHAGE) 500 MG tablet Take 1 tablet (500 mg total) by mouth daily with breakfast. 90 tablet 1  . pravastatin (PRAVACHOL) 40 MG tablet Take 1 tablet (40 mg total) by mouth daily. 90 tablet 1  . VENTOLIN HFA 108 (90 Base) MCG/ACT inhaler inhale 2 puffs by mouth once daily as directed 18 g 6  . HYDROcodone-acetaminophen (NORCO/VICODIN) 5-325 MG tablet Take 1 tablet by mouth every 6 (six)  hours as needed. 15 tablet 0   No current facility-administered medications for this visit.    Allergies  Allergen Reactions  . Livalo [Pitavastatin]     Muscle cramps  . Mucinex [Guaifenesin Er]     Made blood sugar go up to 212.   Marland Kitchen Peanut-Containing Drug Products     REACTION: Restricted breathing  . Pravastatin     Muscle cramps  . Prednisone     Bloating and abdominal pain.   Marland Kitchen Zocor [Simvastatin]     Muscle cramps      Exam:  BP (!) 146/74   Pulse 79   Ht 5\' 3"  (1.6 m)   Wt 167 lb (75.8 kg)   BMI 29.58 kg/m  General: Well Developed, well nourished, tearful at times due to pain. Neuro/Psych: Alert and oriented x3, extra-ocular muscles intact, able to move all 4 extremities, sensation  grossly intact. Skin: Warm and dry, no rashes noted.  Respiratory: Not using accessory muscles, speaking in full sentences, trachea midline.  Cardiovascular: Pulses palpable, no extremity edema. Abdomen: Does not appear distended. MSK:  C-spine Nontender to spinal midline.  Tender to palpation right cervical paraspinal muscle group.  Significantly reduced range of motion. Upper extremity strength is intact throughout.    No results found for this or any previous visit (from the past 48 hour(s)). [name]  Result Date: 01/26/2018 CLINICAL DATA:  Neck pain and stiffness. Right shoulder and upper arm pain. EXAM: MRI CERVICAL SPINE WITHOUT CONTRAST TECHNIQUE: Multiplanar, multisequence MR imaging of the cervical spine was performed. No intravenous contrast was administered. COMPARISON:  None. FINDINGS: Alignment: Physiologic. Vertebrae: Prominent arthritis at the right C1-2 articulation with bone edema, best seen on images 3-4 of series 4. Osteophytes protrude into both neural foramina, more prominent on the right than the left.The facet joints at C3-4 are fused bilaterally. 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 Cord: Normal signal and morphology. Posterior Fossa, vertebral arteries, paraspinal tissues: Negative. Disc levels: C2-3: Small broad-based disc bulge without neural impingement. Moderate bilateral facet arthritis. No foraminal stenosis. C3-4: Disc space narrowing. No disc bulging or protrusion. Fusion of the facet joints bilaterally. No foraminal stenosis. C4-5: Tiny disc bulge with accompanying osteophytes to the right of midline with no neural impingement. Moderate right facet arthritis. Slight left facet arthritis. No foraminal stenosis. C5-6: Disc space narrowing. Small broad-based disc osteophyte complex with slight encroachment upon the left lateral recess. No foraminal stenosis. Moderate left facet arthritis. Ectatic nerve root sleeve on the right. C6-7: Tiny  broad-based disc bulge with no neural impingement. Widely patent neural foramina. Severe left facet arthritis. Ectatic nerve root sleeves bilaterally. C7-T1: Tiny central disc bulge with no neural impingement. Moderate bilateral facet arthritis, left greater than right. No foraminal stenosis. IMPRESSION: 1. Prominent arthritis at the right C1-2 articulation with bone edema. Bilateral foraminal stenosis at C1-2, right greater than left. 2. 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 3. Fusion of the facet joints at C3-4. Electronically Signed   By: Lorriane Shire M.D.   On: 01/26/2018 11:02      Assessment and Plan: 77 y.o. female with  Chronic neck pain: Patient has a significantly abnormal appearing right C1-2 facet joint bone edema.  This is directly in the area that she points to is the maximum location of her pain.  She has failed conservative management unfortunately.  At this point the MRI was obtained for potential facet injections  in possible epidural steroid injections.  Unfortunately after reviewing literature and discussing with my neuroradiology colleagues it is generally considered not safe to do a facet injection at the C1-2 level.  Plan to treat with Norco temporarily.  We will proceed with epidural steroid injection and facet injections at C5 as this also seems to be quite problematic.  One differential that I have not yet considered is rheumatoid arthritis which can cause inflammation at the atlantooccipital joint or C1-C2 joint.  We will proceed with a rheumatologic workup listed below.  Patient will be notified.  Additionally Ms. Schindler has very likely C4-C5 right cervical radiculopathy.  Plan for epidural steroid injection to address probable right C5 radiculopathy as well.  Plan for short-term pain control with Norco.  Discontinue tramadol.  Patient researched [facility] Controlled Substance Reporting System.  Disease  is in the severe range.   Orders Placed This Encounter  Procedures  . Cyclic citrul peptide antibody, IgG  . CBC with Differential/Platelet  . Sedimentation rate  . Rheumatoid factor  . ANA   Meds ordered this encounter  Medications  . HYDROcodone-acetaminophen (NORCO/VICODIN) 5-325 MG tablet    Sig: Take 1 tablet by mouth every 6 (six) hours as needed.    Dispense:  15 tablet    Refill:  0    Discussed warning signs or symptoms. Please see discharge instructions. Patient expresses understanding.

## 2018-01-29 NOTE — Telephone Encounter (Signed)
I discussed your situation with several of my colleagues and the chief Neuroradiologist who does the kinds of injections we were talking about.   1) We should consider rheumatoid arthritis as that can cause inflammation and pain in the joint at the top of the neck where you have so much pain. I have ordered some labs. Please return to get the blood drawn soon.  2) It is not safe to do the injection that high up in the spine. We can do and I will arrange for injections in the neck at a slightly lower level and I am hopeful will help some of the neck and right shoulder pain.   Recheck after injections.

## 2018-01-29 NOTE — Patient Instructions (Addendum)
Thank you for coming in today. I am planning on ordering an injection but I want to ask the radiologists about safety at this level.  STOP aspirin Use hydrocodone for severe pain as needed.  Check back after the injection.  Return sooner if needed.    Facet Syndrome Facet syndrome is a condition in which joints (facet joints) that connect the bones of the spine (vertebrae) become damaged. Facet joints help the spine move, and they usually wear down (degenerate) or become inflamed as you age. This can cause pain and stiffness in the neck (cervical facet syndrome) or in the lower back (lumbar facet syndrome). When a facet joint becomes damaged, a vertebra may slip forward, out of its normal place in the spine. Damage to a facet joint can also damage nerves near the spine, which can cause tingling or weakness in the arms or legs. Facet syndrome can make it difficult to turn the head or bend backward without pain. This condition typically gets worse over time. What are the causes? Common causes of this condition include:  Age-related inflammation of the facet joints (arthritis) that may create extra bone on the joint surface (bone spurs).  Age-related decrease in space between the vertebrae (disk degeneration and cartilage degeneration).  Repetitive stress on the spine, such as repetitive twisting of the back.  Injury (trauma) to the back or neck.  What increases the risk? The following factors may make you more likely to develop this condition:  Playing contact sports.  Doing activities or sports that involve repetitive twisting motions or repetitive heavy lifting.  Having poor back strength and flexibility.  Having another back or spine condition, such as scoliosis.  What are the signs or symptoms? Symptoms of facet syndrome may include:  An ache in the neck or lower back. This may get worse when you twist or arch your back, or when you look up.  Stiffness in the neck or lower  back.  Numbness, tingling, or weakness in the arms or legs.  Symptoms of cervical facet syndrome may include:  Headache.  Pain at the back of the head.  Pain in the shoulder blades.  Symptoms of lumbar facet syndrome may include pain in any of the following areas:  Groin.  Thighs.  Lower back.  Buttocks.  Hips.  How is this diagnosed? This condition may be diagnosed based on:  Your symptoms.  Your medical history.  A physical exam.  Imaging tests, such as: ? X-rays. ? MRI.  A procedure in which medicines to numb the area (local anesthetic) and medicines to reduce inflammation (steroids) are injected into your affected joint (facet joint block).  How is this treated? Treatment for this condition may include:  Stopping or modifying activities that make your condition worse.  Medicines that help reduce pain and inflammation.  Steroid injections to help reduce severe pain.  Physical therapy.  Radiofrequency ablation. This is a surgical procedure that uses high-frequency radio waves to block signals from affected nerves.  Surgery to stabilize your spine or to take pressure off your nerves. This is rare.  Follow these instructions at home: Activity  Rest your neck and back as told by your health care provider.  Return to your normal activities as told by your health care provider. Ask your health care provider what activities are safe for you.  If physical therapy was prescribed, do exercises as told by your health care provider. General instructions  Take over-the-counter and prescription medicines only as told by  your health care provider.  Do not drive or operate heavy machinery while taking prescription pain medicines.  Do not use any tobacco products, such as cigarettes, chewing tobacco, and e-cigarettes. Tobacco can delay bone healing. If you need help quitting, ask your health care provider.  Use good posture throughout your daily activities.  Good posture means that your spine is in its natural S-curve position (your spine is neutral), your shoulders are pulled back slightly, and your head is not forward.  Keep all follow-up visits as told by your health care provider. This is important. Contact a health care provider if:  You have symptoms that get worse or do not improve in 2-4 weeks of treatment.  You have numbness or weakness in any part of your body.  You lose control over your bladder or bowel function. This information is not intended to replace advice given to you by your health care provider. Make sure you discuss any questions you have with your health care provider. Document Released: 10/28/2005 Document Revised: 11/29/2015 Document Reviewed: 07/10/2015 Elsevier Interactive Patient Education  [employer].

## 2018-01-30 NOTE — Telephone Encounter (Signed)
Patient advised of recommendations.  

## 2018-01-30 NOTE — Telephone Encounter (Signed)
Left message on patient  Vm to call back for information as noted below. Rhonda Cunningham,CMA

## 2018-02-02 LAB — CBC WITH DIFFERENTIAL/PLATELET
BASOS ABS: 51 {cells}/uL (ref 0–200)
Basophils Relative: 1 %
EOS ABS: 143 {cells}/uL (ref 15–500)
Eosinophils Relative: 2.8 %
HEMATOCRIT: 35 % (ref 35.0–45.0)
Hemoglobin: 12.1 g/dL (ref 11.7–15.5)
Lymphs Abs: 2596 cells/uL (ref 850–3900)
MCH: 28.9 pg (ref 27.0–33.0)
MCHC: 34.6 g/dL (ref 32.0–36.0)
MCV: 83.7 fL (ref 80.0–100.0)
MONOS PCT: 9.5 %
MPV: 10.9 fL (ref 7.5–12.5)
NEUTROS ABS: 1826 {cells}/uL (ref 1500–7800)
Neutrophils Relative %: 35.8 %
Platelets: 271 10*3/uL (ref 140–400)
RBC: 4.18 10*6/uL (ref 3.80–5.10)
RDW: 14.1 % (ref 11.0–15.0)
Total Lymphocyte: 50.9 %
WBC mixed population: 485 cells/uL (ref 200–950)
WBC: 5.1 10*3/uL (ref 3.8–10.8)

## 2018-02-02 LAB — RHEUMATOID FACTOR: Rhuematoid fact SerPl-aCnc: <14 IU/mL (ref <14)

## 2018-02-02 LAB — ANA: Anti Nuclear Antibody(ANA): NEGATIVE

## 2018-02-02 LAB — SEDIMENTATION RATE: SED RATE: 19 mm/h (ref 0–30)

## 2018-02-02 LAB — CYCLIC CITRUL PEPTIDE ANTIBODY, IGG: Cyclic Citrullin Peptide Ab: <16 UNITS

## 2018-02-03 ENCOUNTER — Ambulatory Visit: Payer: Medicare FFS | Admitting: Family Medicine

## 2018-02-05 ENCOUNTER — Ambulatory Visit
Admission: RE | Admit: 2018-02-05 | Discharge: 2018-02-05 | Disposition: A | Discharge status: Home / Self Care | Payer: Medicare FFS | Source: Ambulatory Visit | Attending: Family Medicine | Admitting: Family Medicine

## 2018-02-05 MED ORDER — IOPAMIDOL (ISOVUE-M 300) INJECTION 61%
1.0000 mL | Freq: Once | INTRAMUSCULAR | Status: AC | PRN
Start: 2018-02-05 — End: 2018-02-05
  Administered 2018-02-05: 1 mL via EPIDURAL

## 2018-02-05 MED ORDER — TRIAMCINOLONE ACETONIDE 40 MG/ML IJ SUSP (RADIOLOGY)
60.0000 mg | Freq: Once | INTRAMUSCULAR | Status: AC
  Administered 2018-02-05: 60 mg via EPIDURAL

## 2018-02-05 NOTE — Discharge Instructions (Signed)
Spinal Injection Discharge Instruction Sheet  1. You may resume a regular diet and any medications that you routinely take, including pain medications.  2. No driving the rest of the day of the procedure.  3. Light activity throughout the rest of the day.  Do not do any strenuous work, exercise, bending or lifting.  The day following the procedure, you may resume normal physical activity but you should refrain from exercising or physical therapy for at least three days.   Common Side Effects:   Headaches- take your usual medications as directed by your physician.     Restlessness or inability to sleep- you may have trouble sleeping for the next few days.  Ask your referring physician if you need any medication for sleep if over the counter sleep medications do not help.   Facial flushing or redness- this should subside within a few days.   Increased pain- a temporary increase in pain a day or two following your procedure is not unusual.  Take your pain medication as prescribed by your referring physician.  You may use ice to the injection site as needed.  Please do not use heat for 24 hours.   Leg cramps  Please contact our office at [phone] for the following symptoms:  Fever greater than 100 degrees.  Headaches unresolved with medication after 2-3 days.  Increased swelling, pain, or redness at injection site.  Thank you for visiting our office.   If the pain at the base of your skull continues, ask Dr. Georgina Snell if he feels like an Occipital Nerve Block, done by Dr. Jola Baptist here, would be of any benefit.

## 2018-02-19 ENCOUNTER — Ambulatory Visit
Admission: RE | Admit: 2018-02-19 | Discharge: 2018-02-19 | Disposition: A | Discharge status: Home / Self Care | Payer: Medicare FFS | Source: Ambulatory Visit | Attending: Family Medicine | Admitting: Family Medicine

## 2018-02-19 MED ORDER — IOPAMIDOL (ISOVUE-M 300) INJECTION 61%
1.0000 mL | Freq: Once | INTRAMUSCULAR | Status: AC | PRN
  Administered 2018-02-19: 1 mL via INTRA_ARTICULAR

## 2018-02-19 MED ORDER — TRIAMCINOLONE ACETONIDE 40 MG/ML IJ SUSP (RADIOLOGY)
60.0000 mg | Freq: Once | INTRAMUSCULAR | Status: AC
  Administered 2018-02-19: 60 mg via EPIDURAL

## 2018-02-19 NOTE — Discharge Instructions (Signed)

## 2018-03-12 ENCOUNTER — Telehealth: Payer: Self-pay

## 2018-03-12 NOTE — Telephone Encounter (Signed)
Called office to be scheduled for an appt with Luvenia Starch since her Has have not been improving. I saw in chart where in the past, it looks like she had seen Dr Georgina Snell for some HA/back pain issues. Pt states nothing has helped her so far and she is not sure if she needs a referral elsewhere to handle pain.   [name], I have added this patient to your schedule to discuss this issue but please let me know if this is appropriate or if she should be scheduled to discuss this with Dr Georgina Snell.   Pt scheduled for 03-17-18, but since she was unsure who she truly needed to talk to, I advised her I would check with you and will call her back to reschedule if needed.   Please advise

## 2018-03-12 NOTE — Telephone Encounter (Signed)
If it is her neck pain and radiculopathy she either needs to see Dr. Georgina Snell or I could refer her out to an orthopedic office. Which would she like?

## 2018-03-13 NOTE — Telephone Encounter (Signed)
Pt called back and states that she would rather see Dr Georgina Snell before being referred somewhere else. Appt changed to Dr Georgina Snell schedule. No further needs at this time

## 2018-03-13 NOTE — Telephone Encounter (Signed)
Called and left pt msg advising on [name]'s recommendations. Instructed pt to call me back with whichever she prefers, Dr Georgina Snell or orthopedic

## 2018-03-17 ENCOUNTER — Encounter: Payer: Self-pay | Admitting: Family Medicine

## 2018-03-17 ENCOUNTER — Ambulatory Visit: Payer: Medicare FFS | Admitting: Physician Assistant

## 2018-03-17 ENCOUNTER — Ambulatory Visit: Payer: Medicare FFS | Admitting: Family Medicine

## 2018-03-17 VITALS — BP 148/71 | HR 82 | Ht 63.0 in | Wt 164.0 lb

## 2018-03-17 DIAGNOSIS — G8929 Other chronic pain: Secondary | ICD-10-CM

## 2018-03-17 DIAGNOSIS — M436 Torticollis: Secondary | ICD-10-CM

## 2018-03-17 DIAGNOSIS — M47812 Spondylosis without myelopathy or radiculopathy, cervical region: Secondary | ICD-10-CM | POA: Diagnosis not present

## 2018-03-17 DIAGNOSIS — G4452 New daily persistent headache (NDPH): Secondary | ICD-10-CM | POA: Diagnosis not present

## 2018-03-17 DIAGNOSIS — M542 Cervicalgia: Secondary | ICD-10-CM | POA: Diagnosis not present

## 2018-03-17 DIAGNOSIS — M5412 Radiculopathy, cervical region: Secondary | ICD-10-CM | POA: Diagnosis not present

## 2018-03-17 MED ORDER — OMEPRAZOLE 40 MG PO CPDR: 40.0000 mg | DELAYED_RELEASE_CAPSULE | Freq: Every day | ORAL | 3 refills | Status: DC

## 2018-03-17 MED ORDER — ASPIRIN-CAFFEINE 400-32 MG PO TABS: 1.0000 | ORAL_TABLET | Freq: Three times a day (TID) | ORAL | 0 refills | Status: DC | PRN

## 2018-03-17 NOTE — Patient Instructions (Signed)
Thank you for coming in today. You should hear from the radiologists soon about FACET injection which is different than the kind of injection you already had.  If not better after that let me know.   Take omeprazole with the aspirin. Take omeprazole daily.  This will protect your stomach.

## 2018-03-17 NOTE — Progress Notes (Signed)
Lindsey Fischer is a 77 y.o. female who presents to [facility] today for neck pain follow up.   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 She recently received an epidural steroid injection . This relieved some arm pain she was having in her shoulder, but her neck and head pain persists. She characterizes the pain as on the right upper neck and right occipital area of her head. The pain is constant and severe. It worsens when she combs or washes her hair, or moves her head in any direction. She takes tylenol and aspirin several times a day to relieve pain. The pain is still significantly affecting her life and she is still not sleeping well.    Past Medical History:  Diagnosis Date  . Asthma   . Combined forms of age-related cataract of both eyes 05/27/2017  . Diabetes mellitus    pre diabetes  . Hyperlipidemia   . Hypertension   . 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 Posterior vitreous detachment of both eyes 05/27/2017   Past Surgical History:  Procedure Laterality Date  . APPENDECTOMY    . CATARACT EXTRACTION Right   . OVARIAN CYST REMOVAL     Social History   Tobacco Use  . Smoking status: Former Smoker    Last attempt to quit: 11/11/1980    Years since quitting: 37.3  . Smokeless tobacco: Never Used  Substance Use Topics  . Alcohol use: No   family history includes COPD in her sister; Cancer in her brother and sister; Diabetes in her mother; Heart attack (age of onset: 42) in her father.  ROS as above:  Medications: Current Outpatient Medications  Medication Sig Dispense Refill  . ACCU-CHEK AVIVA PLUS test strip USE TO TEST BLOOD SUGAR TWICE A DAY 100 each 2  . AMBULATORY NON FORMULARY MEDICATION Nebulizer machine and tubing  Dx:  COPD exacerbation 1 Units 0  . AMBULATORY NON FORMULARY MEDICATION 2 (two)  times daily. Osteobiflex    . AMBULATORY NON FORMULARY MEDICATION Accu-Check Aviva Plus test strips Check blood sugar twice a day Dx: E11.8 100 each 1  . baclofen (LIORESAL) 10 MG tablet Take 1/2 to 1 tablet up to twice a day. 30 each 0  . diclofenac sodium (VOLTAREN) 1 % GEL Apply 4 g topically 4 (four) times daily. 1 Tube 1  . ferrous sulfate 325 (65 FE) MG tablet Take 325 mg by mouth daily with breakfast.    . Fluticasone-Salmeterol (ADVAIR DISKUS) 250-50 MCG/DOSE AEPB inhale 1 puff INTO THE LUNGS every 12 hours 180 each 3  . HYDROcodone-acetaminophen (NORCO/VICODIN) 5-325 MG tablet Take 1 tablet by mouth every 6 (six) hours as needed. 15 tablet 0  . ipratropium-albuterol (DUONEB) 0.5-2.5 (3) MG/3ML SOLN Take 3 mLs by nebulization every 6 (six) hours as needed. 360 mL 0  . lisinopril-hydrochlorothiazide (PRINZIDE,ZESTORETIC) 20-25 MG tablet Take 1 tablet by mouth daily. 30 tablet 5  . magnesium 30 MG tablet Take 30 mg by mouth daily.    . metFORMIN (GLUCOPHAGE) 500 MG tablet Take 1 tablet (500 mg total) by mouth daily with breakfast. 90 tablet 1  . pravastatin (PRAVACHOL) 40 MG tablet Take 1 tablet (40 mg total) by mouth daily. 90 tablet 1  . VENTOLIN HFA 108 (90 Base) MCG/ACT inhaler inhale 2 puffs by mouth once daily as directed 18 g 6  . omeprazole (PRILOSEC) 40 MG capsule Take 1 capsule (  40 mg total) by mouth daily. 90 capsule 3   No current facility-administered medications for this visit.    Allergies  Allergen Reactions  . Peanut-Containing Drug Products Shortness Of Breath    Restricted breathing  . Livalo [Pitavastatin] Other (See Comments)    Muscle cramps  . Mucinex [Guaifenesin Er] Other (See Comments)    Made blood sugar go up to 212.   . Pravastatin Other (See Comments)    Muscle cramps  . Prednisone Other (See Comments)    Bloating and abdominal pain, with oral product only.  . Zocor [Simvastatin] Other (See Comments)    Muscle cramps     Health  Maintenance Health Maintenance  Topic Date Due  . FOOT EXAM  10/22/2017  . MAMMOGRAM  01/07/2018  . INFLUENZA VACCINE  06/11/2018  . OPHTHALMOLOGY EXAM  06/18/2018  . HEMOGLOBIN A1C  07/19/2018  . TETANUS/TDAP  03/05/2022  . DEXA SCAN  Completed  . PNA vac Low Risk Adult  Completed     Exam:  BP (!) 148/71   Pulse 82   Ht 5\' 3"  (1.6 m)   Wt 164 lb (74.4 kg)   BMI 29.05 kg/m  Gen: Well NAD HEENT: EOMI,  MMM Lungs: Normal work of breathing. CTABL Heart: RRR no MRG Abd: NABS, Soft. Nondistended, Nontender Exts: Brisk capillary refill, warm and well perfused.   C-spine: Nontender to palpaion on spinal midline.  Tender to palpaion right of cervical paraspinal muscle group. Right Occipital head pain replicated with head movement in any direction.  ROM severely limited.  Upper extremity strength intact throughout.    No results found for this or any previous visit (from the past 72 hour(s)). No results found.    Assessment and Plan: 77 y.o. female with cervical arthritis and radiculopathy.   Patient is still experiencing a lot of pain on her occipital skull due to her severe arthritis in her upper cervical spine. Due to the location she is not a candidate facet injection at C1-2. She may be a candidate for occipital nerve block in the future.    She also has neck pain due to lower cervical spine (C6-7) arthritis which can receive a facet injection. This is a better pain to target at this time, so we will refer her to interventional radiology to perform a facet injection here. She will follow up to check for improvement. At next follow up, we will discuss potential occipital nerve block or referral to pain management.   Continue aspirin and tylenol as this helps. Prescribe omeprazole for GI PPX.  Orders Placed This Encounter  Procedures  . DG FACET JT INJ C/T SINGLE LEVEL LEFT W/FL/CT    Order Specific Question:   Reason for exam:    Answer:   left C6-7    Order  Specific Question:   Preferred imaging location?    Answer:   [id number]   Meds ordered this encounter  Medications  . omeprazole (PRILOSEC) 40 MG capsule    Sig: Take 1 capsule (40 mg total) by mouth daily.    Dispense:  90 capsule    Refill:  3     Discussed warning signs or symptoms. Please see discharge instructions. Patient expresses understanding.

## 2018-04-09 ENCOUNTER — Other Ambulatory Visit: Payer: Self-pay | Admitting: *Deleted

## 2018-04-09 MED ORDER — IPRATROPIUM-ALBUTEROL 0.5-2.5 (3) MG/3ML IN SOLN: 3.0000 mL | Freq: Four times a day (QID) | RESPIRATORY_TRACT | 1 refills | Status: DC | PRN

## 2018-04-17 ENCOUNTER — Other Ambulatory Visit: Payer: Self-pay | Admitting: *Deleted

## 2018-04-17 MED ORDER — ALBUTEROL SULFATE HFA 108 (90 BASE) MCG/ACT IN AERS: INHALATION_SPRAY | RESPIRATORY_TRACT | 6 refills | Status: DC

## 2018-04-21 ENCOUNTER — Other Ambulatory Visit: Payer: Medicare FFS

## 2018-04-23 ENCOUNTER — Ambulatory Visit
Admission: RE | Admit: 2018-04-23 | Discharge: 2018-04-23 | Disposition: A | Discharge status: Home / Self Care | Payer: Medicare FFS | Source: Ambulatory Visit | Attending: Family Medicine | Admitting: Family Medicine

## 2018-04-23 MED ORDER — DEXAMETHASONE SODIUM PHOSPHATE 4 MG/ML IJ SOLN
5.0000 mg | Freq: Once | INTRAMUSCULAR | Status: AC
  Administered 2018-04-23: 5 mg via INTRA_ARTICULAR

## 2018-04-23 NOTE — Discharge Instructions (Signed)

## 2018-05-01 ENCOUNTER — Ambulatory Visit: Payer: Medicare FFS | Admitting: Physician Assistant

## 2018-05-01 ENCOUNTER — Encounter: Payer: Self-pay | Admitting: Physician Assistant

## 2018-05-01 ENCOUNTER — Telehealth: Payer: Self-pay | Admitting: Physician Assistant

## 2018-05-01 VITALS — BP 176/82 | HR 77 | Wt 167.0 lb

## 2018-05-01 DIAGNOSIS — I10 Essential (primary) hypertension: Secondary | ICD-10-CM

## 2018-05-01 DIAGNOSIS — G8929 Other chronic pain: Secondary | ICD-10-CM | POA: Diagnosis not present

## 2018-05-01 DIAGNOSIS — M5412 Radiculopathy, cervical region: Secondary | ICD-10-CM

## 2018-05-01 DIAGNOSIS — M47812 Spondylosis without myelopathy or radiculopathy, cervical region: Secondary | ICD-10-CM

## 2018-05-01 DIAGNOSIS — E118 Type 2 diabetes mellitus with unspecified complications: Secondary | ICD-10-CM

## 2018-05-01 DIAGNOSIS — M542 Cervicalgia: Secondary | ICD-10-CM

## 2018-05-01 LAB — POCT GLYCOSYLATED HEMOGLOBIN (HGB A1C): HEMOGLOBIN A1C: 6.3 % — AB (ref 4.0–5.6)

## 2018-05-01 MED ORDER — HYDROCODONE-ACETAMINOPHEN 5-325 MG PO TABS: 1.0000 | ORAL_TABLET | Freq: Four times a day (QID) | ORAL | 0 refills | Status: DC | PRN

## 2018-05-01 NOTE — Telephone Encounter (Signed)
I saw patient today. She is still in a lot of pain. Epidural injections did not help. She wanted to know if you would order the occipital nerve block like you had talked about at visit in may?

## 2018-05-01 NOTE — Progress Notes (Signed)
Subjective:    Patient ID: Lindsey Fischer, female    DOB: 1941-06-13, 77 y.o.   MRN: [id number]  HPI  Pt is a 77 yo female with T2DM, HTN, chronic neck pain who presents to the clinic for follow up.   DM- she is not checking sugars. She is taking metformin and watching her diet. No hypoglycemic events. No open sores or wounds.   HTN- BP elevated today. Did not take medication. Denies any CP, palpitations, headaches or vision changes.   Pt is in chronic neck pain. She has seen Dr. Georgina Fischer and had 3 epidural injections. No benefit. Her pain is 10/10 daily and persisent. She went to a chiropractor with no benefit. Massages only make worse. She was given norco at last visit but never got filled. She is taking ibuprofen/tylenol  Alternating every 4-6 hours.   .. Active Ambulatory Problems    Diagnosis Date Noted  . Dyslipidemia 01/19/2010  . GRIEF REACTION, ACUTE 03/27/2010  . ESSENTIAL HYPERTENSION, BENIGN 03/27/2010  . Asthma 01/19/2010  . Metabolic syndrome X 37/62/8315  . DM (diabetes mellitus) ([place]) 11/15/2012  . Umbilical hernia 17/61/6073  . Diverticulosis 12/20/2015  . Gastritis and gastroduodenitis 12/20/2015  . Helicobacter pylori gastritis 12/20/2015  . Hyperlipidemia 04/22/2016  . New daily persistent headache 10/22/2016  . Pure hypercholesterolemia 04/22/2017  . Elevated serum creatinine 04/23/2017  . Combined forms of age-related cataract of both eyes 05/27/2017  . 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 Posterior vitreous detachment of both eyes 05/27/2017  . CKD stage G3a/A2, GFR 45-59 and albumin creatinine ratio 30-299 mg/g ([place]) 06/03/2017  . Neck stiffness 08/20/2017  . Numbness 08/20/2017  . Chronic neck pain 01/16/2018  . 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 Cervical radiculopathy at C5 01/29/2018   Resolved Ambulatory Problems    Diagnosis Date Noted  . Impaired fasting glucose 03/27/2010  . Hypoxemia 05/22/2010  . Pneumonia 05/06/2011   . Epigastric abdominal tenderness with rebound tenderness 12/19/2015  . Non-intractable cyclical vomiting with nausea 12/19/2015  . Abdominal guarding 12/19/2015  . Diarrhea 12/19/2015  . Atypical chest pain 10/22/2016  . Neck muscle spasm 08/20/2017  . Other headache syndrome 08/20/2017   Past Medical History:  Diagnosis Date  . Asthma   . Combined forms of age-related cataract of both eyes 05/27/2017  . Diabetes mellitus   . Hyperlipidemia   . Hypertension   . 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 Posterior vitreous detachment of both eyes 05/27/2017    Review of Systems  All other systems reviewed and are negative.      Objective:   Physical Exam  Constitutional: She is oriented to person, place, and time. She appears well-developed and well-nourished.  HENT:  Head: Normocephalic and atraumatic.  Cardiovascular: Normal rate and regular rhythm.  Pulmonary/Chest: Effort normal and breath sounds normal.  Musculoskeletal:  Decreased ROM due to pain.  Tenderness to palpation over right sided Paraspinous muscles.   Neurological: She is alert and oriented to person, place, and time.  Psychiatric: She has a normal mood and affect. Her behavior is normal.          Assessment & Plan:  Marland KitchenMarland KitchenNaya was seen today for follow-up.  Diagnoses and all orders for this visit:  Type 2 diabetes mellitus with complication, without long-term current use of insulin (HCC) -     POCT HgB A1C  Essential hypertension, benign  Chronic neck pain -     HYDROcodone-acetaminophen (NORCO/VICODIN) 5-325 MG  tablet; Take 1-2 tablets by mouth every 6 (six) hours as needed. 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 Encouraged her to take and recheck in 1 month. Discussed importance of BP control.  I do think some of elevation is coming from pain.   .. Lab Results  Component Value Date   HGBA1C 6.3 (A) 05/01/2018   A1C improved.  On ACE.  On STATIN.  Continue on same medications.  Follow up in 3 months.   Will make referral for consult by neurosurgeon to make sure she is not a surgical candidate. Pt wound like to consider occipital nerve block. I will send Dr. Georgina Fischer to see if he would set up. I will give norco for her to try. Certainly if helps and no other intervention is will could send to pain management. Due to CKD avoid NSAIDs.   [place] controlled substance database reviewed with no concerns.

## 2018-05-18 NOTE — Telephone Encounter (Signed)
Ok kelsi. Ok to make referral if she is ok with it.

## 2018-05-18 NOTE — Telephone Encounter (Signed)
Left VM with status update.  

## 2018-05-18 NOTE — Telephone Encounter (Signed)
Refer to neurology sent who will typically do occipital nerve block

## 2018-05-18 NOTE — Telephone Encounter (Signed)
Referral has been made.

## 2018-05-22 ENCOUNTER — Telehealth: Payer: Self-pay

## 2018-05-22 NOTE — Telephone Encounter (Signed)
Patient has been advised. She stated that she has an appointment with [facility] neurology in September and she will make sure they send noted to Dr. Georgina Snell. Rhonda Cunningham,CMA

## 2018-05-22 NOTE — Telephone Encounter (Signed)
I do not have the notes yet from [facility] neurology.  I do not know if they did the occipital nerve block or not. I think is not a bad idea to get the opinion of neurosurgery to see if they could help manage her pain as well.  I know this is a lot of doctors but I do think having more people with different backgrounds is more likely to come up with a treatment plan that has the best chance of getting pain control.

## 2018-05-22 NOTE — Telephone Encounter (Signed)
Patient called stated that she has seen [facility] Neurology and she has an upcoming appt on next week with [facility]. She wants to know if she should keep that appointment. Please advise. Tom Ragsdale,CMA

## 2018-05-22 NOTE — Telephone Encounter (Signed)
Left message on patient vm that I will try and call her back today with advise. Rhonda Cunningham,CMA

## 2018-05-28 ENCOUNTER — Other Ambulatory Visit: Payer: Self-pay | Admitting: Neurosurgery

## 2018-05-28 DIAGNOSIS — M542 Cervicalgia: Secondary | ICD-10-CM

## 2018-06-08 ENCOUNTER — Other Ambulatory Visit: Payer: Self-pay | Admitting: Neurosurgery

## 2018-06-08 DIAGNOSIS — M542 Cervicalgia: Secondary | ICD-10-CM

## 2018-06-23 ENCOUNTER — Other Ambulatory Visit: Payer: Medicare FFS

## 2018-07-22 ENCOUNTER — Encounter: Payer: Self-pay | Admitting: Diagnostic Neuroimaging

## 2018-07-22 ENCOUNTER — Ambulatory Visit: Payer: Medicare FFS | Admitting: Diagnostic Neuroimaging

## 2018-07-22 VITALS — BP 146/76 | HR 75 | Ht 63.0 in | Wt 166.8 lb

## 2018-07-22 DIAGNOSIS — M5412 Radiculopathy, cervical region: Secondary | ICD-10-CM | POA: Diagnosis not present

## 2018-07-22 DIAGNOSIS — M542 Cervicalgia: Secondary | ICD-10-CM

## 2018-07-22 NOTE — Progress Notes (Signed)
[facility]  PATIENT: Lindsey Fischer DOB: 1941/06/20  REFERRING CLINICIAN: Steva Colder HISTORY FROM: patient  REASON FOR VISIT: new consult    HISTORICAL  CHIEF COMPLAINT:  Chief Complaint  Patient presents with  . New Patient (Initial Visit)    Rm 7, sister, Arlene.  . Dr. Sherene Sires    Chronic Head, Neck Pain, second opinion.    HISTORY OF PRESENT ILLNESS:   77 year old female here for evaluation of head and neck pain.  December 2017 patient had onset of neck pain radiating to the head, mainly on the right side.  Also with pain radiating to right arm.  Patient had MRI of the cervical spine in March 2019 which showed significant arthritis and facet disease.  Patient was diagnosed with cervical degenerative spine disease, likely contributing to her right head, neck and arm pain.  Patient is also been referred to neurosurgery who recommended epidural steroid injections.  This has been ordered but not done yet.  In the meantime patient was referred here for second opinion.  No major problems with left arm.  No problems with the legs.  No vision changes or slurred speech.   REVIEW OF SYSTEMS: Full 14 system review of systems performed and negative with exception of: As per HPI.  ALLERGIES: Allergies  Allergen Reactions  . Peanut-Containing Drug Products Shortness Of Breath    Restricted breathing  . Livalo [Pitavastatin] Other (See Comments)    Muscle cramps  . Mucinex [Guaifenesin Er] Other (See Comments)    Made blood sugar go up to 212.   . Pravastatin Other (See Comments)    Muscle cramps  . Prednisone Other (See Comments)    Bloating and abdominal pain, with oral product only.  . Zocor [Simvastatin] Other (See Comments)    Muscle cramps   . Tramadol     Somnolence.     HOME MEDICATIONS: Outpatient Medications Prior to Visit  Medication Sig Dispense Refill  . ACCU-CHEK AVIVA PLUS test strip USE TO TEST BLOOD SUGAR TWICE A DAY 100 each 2  .  acetaminophen (TYLENOL) 500 MG tablet Take 1,000 mg by mouth every 6 (six) hours as needed.    Marland Kitchen albuterol (VENTOLIN HFA) 108 (90 Base) MCG/ACT inhaler inhale 2 puffs by mouth once daily as directed 18 g 6  . AMBULATORY NON FORMULARY MEDICATION Nebulizer machine and tubing  Dx:  COPD exacerbation 1 Units 0  . AMBULATORY NON FORMULARY MEDICATION 2 (two) times daily. Osteobiflex    . AMBULATORY NON FORMULARY MEDICATION Accu-Check Aviva Plus test strips Check blood sugar twice a day Dx: E11.8 100 each 1  . aspirin 325 MG EC tablet Take 1,300 mg by mouth as needed for pain.    . Aspirin-Caffeine 400-32 MG TABS Take 1 tablet by mouth every 8 (eight) hours as needed (pain). 120 each 0  . ferrous sulfate 325 (65 FE) MG tablet Take 325 mg by mouth daily with breakfast.    . ipratropium-albuterol (DUONEB) 0.5-2.5 (3) MG/3ML SOLN Take 3 mLs by nebulization every 6 (six) hours as needed. 360 mL 1  . lisinopril-hydrochlorothiazide (PRINZIDE,ZESTORETIC) 20-25 MG tablet Take 1 tablet by mouth daily. 30 tablet 5  . magnesium 30 MG tablet Take 30 mg by mouth daily.    . metFORMIN (GLUCOPHAGE) 500 MG tablet Take 1 tablet (500 mg total) by mouth daily with breakfast. 90 tablet 1  . omeprazole (PRILOSEC) 40 MG capsule Take 1 capsule (40 mg total) by mouth daily. 90 capsule 3  .  pravastatin (PRAVACHOL) 40 MG tablet Take 1 tablet (40 mg total) by mouth daily. 90 tablet 1  . baclofen (LIORESAL) 10 MG tablet Take 1/2 to 1 tablet up to twice a day. 30 each 0  . diclofenac sodium (VOLTAREN) 1 % GEL Apply 4 g topically 4 (four) times daily. 1 Tube 1  . Fluticasone-Salmeterol (ADVAIR DISKUS) 250-50 MCG/DOSE AEPB inhale 1 puff INTO THE LUNGS every 12 hours 180 each 3  . HYDROcodone-acetaminophen (NORCO/VICODIN) 5-325 MG tablet Take 1-2 tablets by mouth every 6 (six) hours as needed. 40 tablet 0   No facility-administered medications prior to visit.     PAST MEDICAL HISTORY: Past Medical History:  Diagnosis Date  .  Asthma   . Combined forms of age-related cataract of both eyes 05/27/2017  . Diabetes mellitus    pre diabetes  . Hyperlipidemia   . Hypertension   . 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 Posterior vitreous detachment of both eyes 05/27/2017    PAST SURGICAL HISTORY: Past Surgical History:  Procedure Laterality Date  . APPENDECTOMY    . CATARACT EXTRACTION Right   . OVARIAN CYST REMOVAL      FAMILY HISTORY: Family History  Problem Relation Age of Onset  . Diabetes Mother   . Heart attack Father 3       deceased  . Cancer Sister        brain tumor  . Cancer Brother        prostrate and lung CA  . COPD Sister     SOCIAL HISTORY: Social History   Socioeconomic History  . Marital status: Widowed    Spouse name: Not on file  . Number of children: Not on file  . Years of education: Not on file  . Highest education level: Not on file  Occupational History  . Not on file  Social Needs  . Financial resource strain: Not on file  . Food insecurity:    Worry: Not on file    Inability: Not on file  . Transportation needs:    Medical: Not on file    Non-medical: Not on file  Tobacco Use  . Smoking status: Former Smoker    Last attempt to quit: 11/11/1980    Years since quitting: 37.7  . Smokeless tobacco: Never Used  Substance and Sexual Activity  . Alcohol use: No  . Drug use: No  . Sexual activity: Never    Comment: retired Quarry manager, works at [employer] ([place] widowed 2011, 3 kids.  Lifestyle  . Physical activity:    Days per week: Not on file    Minutes per session: Not on file  . Stress: Not on file  Relationships  . Social connections:    Talks on phone: Not on file    Gets together: Not on file    Attends religious service: Not on file    Active member of club or organization: Not on file    Attends meetings of clubs or organizations: Not on file    Relationship status: Not on file  . Intimate partner violence:    Fear of current or ex partner: Not on file     Emotionally abused: Not on file    Physically abused: Not on file    Forced sexual activity: Not on file  Other Topics Concern  . Not on file  Social History Narrative   Lives home alone, widowed.  Works at [employer].  Education 12th grade.  Children 3.  PHYSICAL EXAM  GENERAL EXAM/CONSTITUTIONAL: Vitals:  Vitals:   07/22/18 0800  BP: (!) 146/76  Pulse: 75  Weight: 166 lb 12.8 oz (75.7 kg)  Height: 5\' 3"  (1.6 m)     Body mass index is 29.55 kg/m. Wt Readings from Last 3 Encounters:  07/22/18 166 lb 12.8 oz (75.7 kg)  05/01/18 167 lb (75.8 kg)  03/17/18 164 lb (74.4 kg)     Patient is in no distress; well developed, nourished and groomed  SEVERE DECR ROM IN NECK (ROTATION, FLEX, EXT)  CARDIOVASCULAR:  Examination of carotid arteries is normal; no carotid bruits  Regular rate and rhythm, no murmurs  Examination of peripheral vascular system by observation and palpation is normal  EYES:  Ophthalmoscopic exam of optic discs and posterior segments is normal; no papilledema or hemorrhages  Visual Acuity Screening   Right eye Left eye Both eyes  Without correction:     With correction: 20/30 20/30      MUSCULOSKELETAL:  Gait, strength, tone, movements noted in Neurologic exam below  NEUROLOGIC: MENTAL STATUS:  No flowsheet data found.  awake, alert, oriented to person, place and time  recent and remote memory intact  normal attention and concentration  language fluent, comprehension intact, naming intact  fund of knowledge appropriate  CRANIAL NERVE:   2nd - no papilledema on fundoscopic exam  2nd, 3rd, 4th, 6th - pupils equal and reactive to light, visual fields full to confrontation, extraocular muscles intact, no nystagmus  5th - facial sensation symmetric  7th - facial strength symmetric  8th - hearing intact  9th - palate elevates symmetrically, uvula midline  11th - shoulder shrug symmetric  12th - tongue protrusion  midline  MOTOR:   normal bulk and tone, full strength in the BUE, BLE  SENSORY:   normal and symmetric to light touch, temperature, vibration  COORDINATION:   finger-nose-finger, fine finger movements normal  REFLEXES:   deep tendon reflexes TRACE and symmetric; ABSENT AT ANKLES  GAIT/STATION:   narrow based gait     DIAGNOSTIC DATA (LABS, IMAGING, TESTING) - I reviewed patient records, labs, notes, testing and imaging myself where available.  Lab Results  Component Value Date   WBC 5.1 01/30/2018   HGB 12.1 01/30/2018   HCT 35.0 01/30/2018   MCV 83.7 01/30/2018   PLT 271 01/30/2018      Component Value Date/Time   NA 138 01/16/2018 0846   K 4.2 01/16/2018 0846   CL 102 01/16/2018 0846   CO2 28 01/16/2018 0846   GLUCOSE 136 (H) 01/16/2018 0846   BUN 22 01/16/2018 0846   CREATININE 0.92 01/16/2018 0846   CALCIUM 9.7 01/16/2018 0846   PROT 6.9 01/16/2018 0846   ALBUMIN 3.6 04/22/2017 0945   AST 19 01/16/2018 0846   ALT 17 01/16/2018 0846   ALKPHOS 46 04/22/2017 0945   BILITOT 0.5 01/16/2018 0846   GFRNONAA 60 01/16/2018 0846   GFRAA 70 01/16/2018 0846   Lab Results  Component Value Date   CHOL 203 (H) 04/22/2017   HDL 41 (L) 04/22/2017   LDLCALC 87 04/22/2017   LDLDIRECT 100 (H) 09/03/2011   TRIG 376 (H) 04/22/2017   CHOLHDL 5.0 (H) 04/22/2017   Lab Results  Component Value Date   HGBA1C 6.3 (A) 05/01/2018   No results found for: VITAMINB12 Lab Results  Component Value Date   TSH 2.034 07/07/2012    01/26/18 MRI cervical [I reviewed images myself and agree with interpretation. -VRP] ADDENDUM: Correction: Impression #2  should read "Severe left facet arthritis at C6-7 with a small effusion and bone edema without neural impingement". 1. Prominent arthritis at the right C1-2 articulation with bone edema. Bilateral foraminal stenosis at C1-2, right greater than left. 2. Severe left facet arthritis at C5-6 with a small effusion and bone edema  without neural impingement. 3. Fusion of the facet joints at C3-4.  09/01/17 MRI brain [I reviewed images myself and agree with interpretation. -VRP]  - Unremarkable exam for age.  No specific explanation for headache.    ASSESSMENT AND PLAN  77 y.o. year old female here with right neck, head, arm pain, likely related to degenerative cervical spine disease.  Dx: cervical radiculopathy (right C2) + degenerative cervical spine disease  1. Pain of cervical facet joint   2. Cervical radiculopathy     PLAN:  - agree with neurosurgery (Dr. Saintclair Halsted) opinion; agree with cervical epidural / facet injections  - consider gabapentin, lyrica, cymbalta, muscle relaxers as needed for pain - follow up with Dr. Georgina Snell and Dr. Saintclair Halsted  Return for return to Dr. Georgina Snell and Dr. Saintclair Halsted.    Penni Bombard, MD 6/38/9373, 4:28 AM Certified in Neurology, Neurophysiology and Neuroimaging  [facility] [address] [place], [place] [address] [phone]

## 2018-07-31 ENCOUNTER — Ambulatory Visit: Payer: Medicare FFS | Admitting: Physician Assistant

## 2018-07-31 ENCOUNTER — Encounter: Payer: Self-pay | Admitting: Physician Assistant

## 2018-07-31 VITALS — BP 152/69 | HR 70 | Temp 97.7°F | Wt 164.8 lb

## 2018-07-31 DIAGNOSIS — M5412 Radiculopathy, cervical region: Secondary | ICD-10-CM

## 2018-07-31 DIAGNOSIS — L600 Ingrowing nail: Secondary | ICD-10-CM | POA: Diagnosis not present

## 2018-07-31 DIAGNOSIS — M542 Cervicalgia: Secondary | ICD-10-CM

## 2018-07-31 DIAGNOSIS — I10 Essential (primary) hypertension: Secondary | ICD-10-CM

## 2018-07-31 DIAGNOSIS — E118 Type 2 diabetes mellitus with unspecified complications: Secondary | ICD-10-CM

## 2018-07-31 DIAGNOSIS — G8929 Other chronic pain: Secondary | ICD-10-CM

## 2018-07-31 LAB — POCT GLYCOSYLATED HEMOGLOBIN (HGB A1C): Hemoglobin A1C: 6.2 % — AB (ref 4.0–5.6)

## 2018-07-31 MED ORDER — ALBUTEROL SULFATE HFA 108 (90 BASE) MCG/ACT IN AERS: INHALATION_SPRAY | RESPIRATORY_TRACT | 2 refills | Status: DC

## 2018-07-31 MED ORDER — GABAPENTIN 100 MG PO CAPS: ORAL_CAPSULE | ORAL | 3 refills | Status: DC

## 2018-07-31 NOTE — Progress Notes (Signed)
Subjective:     Patient ID: Lindsey Fischer, female   DOB: 03/22/1941, 77 y.o.   MRN: [id number]  HPI Patient is a 77 yo female with a history of T2DM, hypertension, and chronic neck pain presenting today for her 3 month follow-up.  DM- Patient is taking metformin and checking her sugars. She states her sugars tend to range from 106-120. She has been watching her diet and trying to eat relatively healthy. She states she had an eye exam scheduled for October, but had to cancel due to another appointment and needs to reschedule. No hypoglycemia. No open wounds.   HTN- Patient's blood pressure is elevated today. She states it has been running high lately. She says she has been checking it at home. She denies any chest pain, palpitations, or vision changes. She is in chronic neck pain and feels like her pain relates to BP.   Patient states that she has an ingrown toenail of her left big toe which is causing her pain. She states it hurts off and on, but is aggravated by things touching it like the sheet of her bed. She would like it removed.   Patient complains of chronic neck pain. She also complains of head pain which she associates with the neck pain. She has been taking Tylenol and aspirin for the pain and states it helps a little. She has had 3 injections previously and they did not help. Patient denies any numbness or tingling of her upper extremities. She does complain of some left shoulder pain that she just noticed yesterday. Tramadol makes her fall asleep.   .. Active Ambulatory Problems    Diagnosis Date Noted  . Dyslipidemia 01/19/2010  . GRIEF REACTION, ACUTE 03/27/2010  . ESSENTIAL HYPERTENSION, BENIGN 03/27/2010  . Asthma 01/19/2010  . Metabolic syndrome X 15/40/0867  . DM (diabetes mellitus) ([place]) 11/15/2012  . Umbilical hernia 61/95/0932  . Diverticulosis 12/20/2015  . Gastritis and gastroduodenitis 12/20/2015  . Hyperlipidemia 04/22/2016  . New daily persistent headache  10/22/2016  . Pure hypercholesterolemia 04/22/2017  . Elevated serum creatinine 04/23/2017  . Combined forms of age-related cataract of both eyes 05/27/2017  . 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 Posterior vitreous detachment of both eyes 05/27/2017  . CKD stage G3a/A2, GFR 45-59 and albumin creatinine ratio 30-299 mg/g ([place]) 06/03/2017  . Neck stiffness 08/20/2017  . Numbness 08/20/2017  . Chronic neck pain 01/16/2018  . 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 Cervical radiculopathy 01/29/2018  . Ingrown toenail of left foot 07/31/2018   Resolved Ambulatory Problems    Diagnosis Date Noted  . Impaired fasting glucose 03/27/2010  . Hypoxemia 05/22/2010  . Pneumonia 05/06/2011  . Epigastric abdominal tenderness with rebound tenderness 12/19/2015  . Non-intractable cyclical vomiting with nausea 12/19/2015  . Abdominal guarding 12/19/2015  . Diarrhea 12/19/2015  . Helicobacter pylori gastritis 12/20/2015  . Atypical chest pain 10/22/2016  . Neck muscle spasm 08/20/2017  . Other headache syndrome 08/20/2017   Past Medical History:  Diagnosis Date  . Asthma   . Diabetes mellitus   . Hypertension      Review of Systems  All other systems reviewed and are negative.      Objective:   Physical Exam  Constitutional: She is oriented to person, place, and time. She appears well-developed and well-nourished.  HENT:  Head: Normocephalic and atraumatic.  Cardiovascular: Normal rate, regular rhythm and normal heart sounds.  Pulses:      Dorsalis pedis pulses  are 2+ on the right side, and 2+ on the left side.  Musculoskeletal:  Left great lateral toenail turned downward. No signs of infection with warmth, swelling, pus. Tender to palpation.   Feet:  Right Foot:  Protective Sensation: 5 sites tested. 5 sites sensed.  Skin Integrity: Positive for callus. Negative for skin breakdown or erythema.  Left Foot:  Protective Sensation: 5 sites tested. 5 sites sensed.   Skin Integrity: Positive for callus. Negative for skin breakdown or erythema.  Neurological: She is alert and oriented to person, place, and time.  Psychiatric: She has a normal mood and affect. Her behavior is normal.       Assessment:     Marland KitchenMarland KitchenDiagnoses and all orders for this visit:  Type 2 diabetes mellitus with complication, without long-term current use of insulin (HCC) -     POCT HgB A1C  Ingrown toenail of left foot  Chronic neck pain -     gabapentin (NEURONTIN) 100 MG capsule; Take one tablet at bedtime for 5 days then increase to one tablet in the morning and evening.  Cervical radiculopathy -     gabapentin (NEURONTIN) 100 MG capsule; Take one tablet at bedtime for 5 days then increase to one tablet in the morning and evening.  Essential hypertension, benign  Other orders -     albuterol (VENTOLIN HFA) 108 (90 Base) MCG/ACT inhaler; inhale 2 puffs by mouth once daily as directed       Plan:     .Marland Kitchen Results for orders placed or performed in visit on 07/31/18  POCT HgB A1C  Result Value Ref Range   Hemoglobin A1C 6.2 (A) 4.0 - 5.6 %   HbA1c POC (<> result, manual entry)     HbA1c, POC (prediabetic range)     HbA1c, POC (controlled diabetic range)     A!C looks great.  Continue on metformin.  On ACE. On STATIN. BP not controlled but likely due to pain.  We ran out of flu shots. She will get at some point.  Needs eye exam.   .. Diabetic Foot Exam - Simple   Simple Foot Form Visual Inspection No deformities, no ulcerations, no other skin breakdown bilaterally:  Yes Sensation Testing Intact to touch and monofilament testing bilaterally:  Yes Pulse Check Posterior Tibialis and Dorsalis pulse intact bilaterally:  Yes Comments Callus on bilateral heels.     Follow up in 6 months for DM.   Will start gabapentin. Will go low since easily sedated. Follow up in 1 month. Discussed we can increase if effective.   Schedule appt for ingrown toenail removal  next Monday. Placed gauze under toenail to help her grow out better.   Marland KitchenVernetta Honey PA-C, have reviewed and agree with the above documentation in it's entirety.

## 2018-07-31 NOTE — Progress Notes (Deleted)
    Ingrown toenail- starting to hurt wants to pull it out. Sometimes it hurts sometimes it doesn't.   DM- 106-120. Checking most every day. Watching her diet. No sores or wounds has blood pressure cuff. Eye exam.Oct 3 Supposed to Oct 3rd but has to cancel. Pulses intact. No open sores or wounds. Diabetic foot exam -- heels are callused.  6.2   HTN- sit resting it will drop down. But has been checking it it was high yesterday. 152/84  Pneumonia vaccine - need repeat.   Neck and head pain-- Shot on oct 3rd. Been taking aspirin and tylenol for the pain.. Brings it down some. No tingling down arms. At work Wednesday and arm went numb and dropped plate of food. Feels a strain during [employer].

## 2018-08-03 ENCOUNTER — Ambulatory Visit: Payer: Medicare FFS | Admitting: Physician Assistant

## 2018-08-03 ENCOUNTER — Encounter: Payer: Self-pay | Admitting: Physician Assistant

## 2018-08-03 VITALS — BP 139/72 | HR 69 | Ht 63.0 in | Wt 164.0 lb

## 2018-08-03 DIAGNOSIS — L600 Ingrowing nail: Secondary | ICD-10-CM

## 2018-08-03 DIAGNOSIS — Z23 Encounter for immunization: Secondary | ICD-10-CM | POA: Diagnosis not present

## 2018-08-03 NOTE — Progress Notes (Signed)
   Subjective:    Patient ID: Lindsey Fischer, female    DOB: 1941-10-05, 77 y.o.   MRN: [id number]  HPI Pt is a 77 yo female who presents to the clinic for ingrown toenail removal. No signs of infection.   .. Active Ambulatory Problems    Diagnosis Date Noted  . Dyslipidemia 01/19/2010  . GRIEF REACTION, ACUTE 03/27/2010  . ESSENTIAL HYPERTENSION, BENIGN 03/27/2010  . Asthma 01/19/2010  . Metabolic syndrome X 78/29/5621  . DM (diabetes mellitus) ([place]) 11/15/2012  . Umbilical hernia 30/86/5784  . Diverticulosis 12/20/2015  . Gastritis and gastroduodenitis 12/20/2015  . Hyperlipidemia 04/22/2016  . New daily persistent headache 10/22/2016  . Pure hypercholesterolemia 04/22/2017  . Elevated serum creatinine 04/23/2017  . Combined forms of age-related cataract of both eyes 05/27/2017  . 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 Posterior vitreous detachment of both eyes 05/27/2017  . CKD stage G3a/A2, GFR 45-59 and albumin creatinine ratio 30-299 mg/g ([place]) 06/03/2017  . Neck stiffness 08/20/2017  . Numbness 08/20/2017  . Chronic neck pain 01/16/2018  . 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 Cervical radiculopathy 01/29/2018  . Ingrown toenail of left foot 07/31/2018   Resolved Ambulatory Problems    Diagnosis Date Noted  . Impaired fasting glucose 03/27/2010  . Hypoxemia 05/22/2010  . Pneumonia 05/06/2011  . Epigastric abdominal tenderness with rebound tenderness 12/19/2015  . Non-intractable cyclical vomiting with nausea 12/19/2015  . Abdominal guarding 12/19/2015  . Diarrhea 12/19/2015  . Helicobacter pylori gastritis 12/20/2015  . Atypical chest pain 10/22/2016  . Neck muscle spasm 08/20/2017  . Other headache syndrome 08/20/2017   Past Medical History:  Diagnosis Date  . Asthma   . Diabetes mellitus   . Hypertension       Review of Systems  All other systems reviewed and are negative.      Objective:   Physical Exam  Constitutional: She  is oriented to person, place, and time. She appears well-developed and well-nourished.  HENT:  Head: Normocephalic and atraumatic.  Musculoskeletal:  Left ingrown toenail.   Neurological: She is alert and oriented to person, place, and time.  Psychiatric: She has a normal mood and affect. Her behavior is normal.          Assessment & Plan:  Marland KitchenMarland KitchenDiagnoses and all orders for this visit:  Ingrown toenail of left foot  Need for immunization against influenza -     Flu Vaccine QUAD 36+ mos IM   Toenail Avulsion Procedure Note  Pre-operative Diagnosis: Left Ingrown Great toenail   Post-operative Diagnosis: Left Ingrown Great toenail  Indications: pain  Anesthesia: Lidocaine 2% without epinephrine without added sodium bicarbonate  Procedure Details  History of allergy to iodine: no  The risks (including bleeding and infection) and benefits of the  procedure and Verbal informed consent obtained.  After digital block anesthesia was obtained, a tourniquet was applied for hemostasis during the procedure.  After prepping with Betadine, the offending edge of the nail was freed from the nailbed and perionychium, and then split with scissors and removed with  forceps.  All visible granulation tissue is debrided. Antibiotic and bulky dressing was applied.   Findings: Ingrown toenail.   Complications: none.  Plan: 1. Soak the foot twice daily. Change dressing twice daily until healed over. 2. Warning signs of infection were reviewed.   3. Recommended that the patient use OTC acetaminophen as needed for pain.

## 2018-08-03 NOTE — Patient Instructions (Addendum)
Fingernail or Toenail Removal, Adult, Care After  This sheet gives you information about how to care for yourself after your procedure. Your health care provider may also give you more specific instructions. If you have problems or questions, contact your health care provider.  What can I expect after the procedure?  After the procedure, it is common to have:  · Pain.  · Redness.  · Swelling.  · Soreness.    Follow these instructions at home:  · If you have a splint:  ? Do not put pressure on any part of the splint until it is fully hardened. This may take several hours.  ? Wear the splint as told by your health care provider. Remove it only as told by your health care provider.  ? Loosen the splint if your fingers or toes tingle, become numb, or turn cold and blue.  ? Keep the splint clean.  ? If the splint is not waterproof:  § Do not let it get wet.  § Cover it with a watertight covering when you take a bath or a shower.  Wound care    · Follow instructions from your health care provider about how to take care of your wound. Make sure you:  ? Wash your hands with soap and water before you change your bandage (dressing). If soap and water are not available, use hand sanitizer.  ? Change your dressing as told by your health care provider.  ? Keep your dressing dry until your health care provider says it can be removed.  ? Leave stitches (sutures), skin glue, or adhesive strips in place. These skin closures may need to stay in place for 2 weeks or longer. If adhesive strip edges start to loosen and curl up, you may trim the loose edges. Do not remove adhesive strips completely unless your health care provider tells you to do that.  · Check your wound every day for signs of infection. Check for:  ? More redness, swelling, or pain.  ? More fluid or blood.  ? Warmth.  ? Pus or a bad smell.  Managing pain, stiffness, and swelling  · Move your fingers or toes often to avoid stiffness and to lessen swelling.  · Raise  (elevate) the injured area above the level of your heart while you are sitting or lying down. You may need to keep your finger or toe raised or supported on a pillow for 24 hours or as told by your health care provider.  · Soak your hand or foot in warm, soapy water for 10-20 minutes, 3 times a day or as told by your health care provider.  Medicine  · Take over-the-counter and prescription medicines only as told by your health care provider.  · If you were prescribed an antibiotic medicine, use it as told by your health care provider. Do not stop using the antibiotic even if your condition improves.  General instructions  · If you were given a shoe to wear, wear it as told by your health care provider.  · Keep all follow-up visits as told by your health care provider. This is important.  Contact a health care provider if:  · You have more redness, swelling, or pain around your wound.  · You have more fluid or blood coming from your wound.  · Your wound feels warm to the touch.  · You have pus or a bad smell coming from your wound.  · You have a fever.  · Your   finger or toe looks blue or black.  This information is not intended to replace advice given to you by your health care provider. Make sure you discuss any questions you have with your health care provider.  Document Released: 11/18/2014 Document Revised: 06/26/2016 Document Reviewed: 05/06/2016  Elsevier Interactive Patient Education © 2018 [employer]

## 2018-08-06 ENCOUNTER — Other Ambulatory Visit: Payer: Self-pay | Admitting: Physician Assistant

## 2018-08-06 DIAGNOSIS — E7800 Pure hypercholesterolemia, unspecified: Secondary | ICD-10-CM

## 2018-08-13 ENCOUNTER — Ambulatory Visit
Admission: RE | Admit: 2018-08-13 | Discharge: 2018-08-13 | Disposition: A | Discharge status: Home / Self Care | Payer: Medicare FFS | Source: Ambulatory Visit | Attending: Neurosurgery | Admitting: Neurosurgery

## 2018-08-13 ENCOUNTER — Other Ambulatory Visit: Payer: Self-pay | Admitting: Neurosurgery

## 2018-08-13 DIAGNOSIS — M542 Cervicalgia: Secondary | ICD-10-CM

## 2018-08-13 MED ORDER — DEXAMETHASONE SODIUM PHOSPHATE 4 MG/ML IJ SOLN
5.0000 mg | Freq: Once | INTRAMUSCULAR | Status: AC
  Administered 2018-08-13: 5.2 mg via INTRA_ARTICULAR

## 2018-08-13 MED ORDER — IOPAMIDOL (ISOVUE-M 300) INJECTION 61%
1.0000 mL | Freq: Once | INTRAMUSCULAR | Status: AC | PRN
  Administered 2018-08-13: 1 mL via INTRA_ARTICULAR

## 2018-08-13 NOTE — Discharge Instructions (Signed)

## 2018-08-28 ENCOUNTER — Other Ambulatory Visit: Payer: Self-pay | Admitting: Physician Assistant

## 2018-08-28 DIAGNOSIS — E118 Type 2 diabetes mellitus with unspecified complications: Secondary | ICD-10-CM

## 2018-09-30 ENCOUNTER — Telehealth: Payer: Self-pay

## 2018-09-30 NOTE — Telephone Encounter (Signed)
Pt left msg with triage asking that her Gabapentin be increased. States that current dose is not controlling SX. I have left pt a msg to confirm what her current dose is.

## 2018-10-05 ENCOUNTER — Ambulatory Visit: Payer: Medicare FFS | Admitting: Family Medicine

## 2018-10-05 ENCOUNTER — Encounter: Payer: Self-pay | Admitting: Family Medicine

## 2018-10-05 VITALS — BP 164/81 | HR 75 | Wt 169.0 lb

## 2018-10-05 DIAGNOSIS — M5412 Radiculopathy, cervical region: Secondary | ICD-10-CM

## 2018-10-05 DIAGNOSIS — G8929 Other chronic pain: Secondary | ICD-10-CM

## 2018-10-05 DIAGNOSIS — M542 Cervicalgia: Secondary | ICD-10-CM | POA: Diagnosis not present

## 2018-10-05 MED ORDER — DULOXETINE HCL 20 MG PO CPEP: 20.0000 mg | ORAL_CAPSULE | Freq: Every day | ORAL | 3 refills | Status: DC

## 2018-10-05 NOTE — Progress Notes (Signed)
Lindsey Fischer is a 77 y.o. female who presents to [facility] [place] today for follow-up neck pain.  Normally has been seen several times for neck pain earlier this year.  She has significant cervical facet disease as well as some mild right-sided cervical radiculopathy.  She had epidural steroid injections which did help the cervical radicular pain and facet injections which helped a little.  She is been controlling her pain with Tylenol which does help some.  She rates her pain as 7 out of 10 at its worse and 4 out of 10 at its best.  Pain does interfere with her quality of life significantly.  She denies weakness or numbness.  She is interested in additional options if possible.   ROS as above:  Exam:  BP (!) 164/81   Pulse 75   Wt 169 lb (76.7 kg)   BMI 29.94 kg/m  Wt Readings from Last 5 Encounters:  10/05/18 169 lb (76.7 kg)  08/03/18 164 lb (74.4 kg)  07/31/18 164 lb 12.8 oz (74.8 kg)  07/22/18 166 lb 12.8 oz (75.7 kg)  05/01/18 167 lb (75.8 kg)    Gen: Well NAD HEENT: EOMI,  MMM Lungs: Normal work of breathing. CTABL Heart: RRR no MRG Abd: NABS, Soft. Nondistended, Nontender Exts: Brisk capillary refill, warm and well perfused.  C-spine normal-appearing nontender to midline decreased range of motion. Upper extremity motion and strength is equal normal throughout bilaterally. Positive right-sided Spurling's test.  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   Electronically Signed   By: Lorriane Shire M.D.   On: 02/26/2018 16:42   Addended by Lorriane Shire, MD on 02/26/2018 4:44 PM    Study Result   CLINICAL DATA:  Neck pain and stiffness. Right shoulder and upper arm  pain.  EXAM: MRI CERVICAL SPINE WITHOUT CONTRAST  TECHNIQUE: Multiplanar, multisequence MR imaging of the cervical spine was performed. No intravenous contrast was administered.  COMPARISON:  None.  FINDINGS: Alignment: Physiologic.  Vertebrae: Prominent arthritis at the right C1-2 articulation with bone edema, best seen on images 3-4 of series 4. Osteophytes protrude into both neural foramina, more prominent on the right than the left.The facet joints at C3-4 are fused bilaterally. 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  Cord: Normal signal and morphology.  Posterior Fossa, vertebral arteries, paraspinal tissues: Negative.  Disc levels:  C2-3: Small broad-based disc bulge without neural impingement. Moderate bilateral facet arthritis. No foraminal stenosis.  C3-4: Disc space narrowing. No disc bulging or protrusion. Fusion of the facet joints bilaterally. No foraminal stenosis.  C4-5: Tiny disc bulge with accompanying osteophytes to the right of midline with no neural impingement. Moderate right facet arthritis. Slight left facet arthritis. No foraminal stenosis.  C5-6: Disc space narrowing. Small broad-based disc osteophyte complex with slight encroachment upon the left lateral recess. No foraminal stenosis. Moderate left facet arthritis. Ectatic nerve root sleeve on the right.  C6-7: Tiny broad-based disc bulge with no neural impingement. Widely patent neural foramina. Severe left facet arthritis. Ectatic nerve root sleeves bilaterally.  C7-T1: Tiny central disc bulge with no neural impingement. Moderate bilateral facet arthritis, left greater than right. No foraminal stenosis.  IMPRESSION: 1. Prominent arthritis at the right C1-2 articulation with bone edema. Bilateral foraminal stenosis at C1-2, right greater than left. 2. Severe  left facet arthritis at C5-6 with a small effusion and bone edema without neural impingement. 3. Fusion of the facet  joints at C3-4.  Electronically Signed: By: Lorriane Shire M.D. On: 01/26/2018 11:02      I personally (independently) visualized and performed the interpretation of the images attached in this note.    Assessment and Plan: 77 y.o. female with right cervical spinal pain and cervical radiculopathy.  Severe degenerative changes present.  Patient may benefit from further facet or epidural steroid injections.  Additionally will modify medications.  Will use Tylenol arthritis and will start Cymbalta.  Refer to pain management as she may benefit from dedicated detailed injection and quick follow-up.  I spent 25 minutes with this patient, greater than 50% was face-to-face time counseling regarding DDX and plan.   Orders Placed This Encounter  Procedures  . Ambulatory referral to Pain Clinic    Referral Priority:   Routine    Referral Type:   Consultation    Referral Reason:   Specialty Services Required    Referred to Provider:   Illene Silver, MD    Requested Specialty:   Pain Medicine    Number of Visits Requested:   1   Meds ordered this encounter  Medications  . DULoxetine (CYMBALTA) 20 MG capsule    Sig: Take 1 capsule (20 mg total) by mouth daily.    Dispense:  30 capsule    Refill:  3     Historical information moved to improve visibility of documentation.  Past Medical History:  Diagnosis Date  . Asthma   . Combined forms of age-related cataract of both eyes 05/27/2017  . Diabetes mellitus    pre diabetes  . Hyperlipidemia   . Hypertension   . 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 Posterior vitreous detachment of both eyes 05/27/2017   Past Surgical History:  Procedure Laterality Date  . APPENDECTOMY    . CATARACT EXTRACTION Right   . OVARIAN CYST REMOVAL     Social History   Tobacco Use  . Smoking status: Former Smoker    Last attempt to quit: 11/11/1980    Years since quitting: 37.9  . Smokeless tobacco: Never Used  Substance Use Topics  . Alcohol  use: No   family history includes COPD in her sister; Cancer in her brother and sister; Diabetes in her mother; Heart attack (age of onset: 71) in her father.  Medications: Current Outpatient Medications  Medication Sig Dispense Refill  . ACCU-CHEK AVIVA PLUS test strip USE TO TEST BLOOD SUGAR TWICE A DAY 100 each 2  . acetaminophen (TYLENOL) 500 MG tablet Take 1,000 mg by mouth every 6 (six) hours as needed.    Marland Kitchen albuterol (VENTOLIN HFA) 108 (90 Base) MCG/ACT inhaler inhale 2 puffs by mouth once daily as directed 18 g 2  . AMBULATORY NON FORMULARY MEDICATION Nebulizer machine and tubing  Dx:  COPD exacerbation 1 Units 0  . AMBULATORY NON FORMULARY MEDICATION 2 (two) times daily. Osteobiflex    . AMBULATORY NON FORMULARY MEDICATION Accu-Check Aviva Plus test strips Check blood sugar twice a day Dx: E11.8 100 each 1  . aspirin 325 MG EC tablet Take 1,300 mg by mouth as needed for pain.    . Aspirin-Caffeine 400-32 MG TABS Take 1 tablet by mouth every 8 (eight) hours as needed (pain). 120 each 0  . ferrous sulfate 325 (65 FE) MG tablet Take 325 mg by mouth daily with breakfast.    .  gabapentin (NEURONTIN) 100 MG capsule Take one tablet at bedtime for 5 days then increase to one tablet in the morning and evening. 60 capsule 3  . ipratropium-albuterol (DUONEB) 0.5-2.5 (3) MG/3ML SOLN Take 3 mLs by nebulization every 6 (six) hours as needed. 360 mL 1  . lisinopril-hydrochlorothiazide (PRINZIDE,ZESTORETIC) 20-25 MG tablet Take 1 tablet by mouth daily. 30 tablet 5  . magnesium 30 MG tablet Take 30 mg by mouth daily.    . metFORMIN (GLUCOPHAGE) 500 MG tablet TAKE 1 TABLET BY MOUTH EVERY DAY WITH BREAKFAST 90 tablet 1  . omeprazole (PRILOSEC) 40 MG capsule Take 1 capsule (40 mg total) by mouth daily. 90 capsule 3  . pravastatin (PRAVACHOL) 40 MG tablet TAKE 1 TABLET BY MOUTH EVERY DAY 90 tablet 1  . DULoxetine (CYMBALTA) 20 MG capsule Take 1 capsule (20 mg total) by mouth daily. 30 capsule 3   No  current facility-administered medications for this visit.    Allergies  Allergen Reactions  . Peanut-Containing Drug Products Shortness Of Breath    Restricted breathing  . Livalo [Pitavastatin] Other (See Comments)    Muscle cramps  . Mucinex [Guaifenesin Er] Other (See Comments)    Made blood sugar go up to 212.   . Pravastatin Other (See Comments)    Muscle cramps  . Prednisone Other (See Comments)    Bloating and abdominal pain, with oral product only.  . Zocor [Simvastatin] Other (See Comments)    Muscle cramps   . Tramadol     Somnolence.      Discussed warning signs or symptoms. Please see discharge instructions. Patient expresses understanding.

## 2018-10-05 NOTE — Patient Instructions (Addendum)
Thank you for coming in today. OK to take 2 of the tylenol arthritis every 8 hours as needed for  pain.  Start cymbata daily (may be better to take at night).  You should hear from Dr Kristian Covey soon.  If you do not hear anything about appointment let me know in 1 week.  If you cannot get an appointment with him in 1 month let me know. I may increase the cymbalta.    Duloxetine delayed-release capsules What is this medicine? DULOXETINE (doo LOX e teen) is used to treat depression, anxiety, and different types of chronic pain. This medicine may be used for other purposes; ask your health care provider or pharmacist if you have questions. COMMON BRAND NAME(S): Cymbalta, [place] What should I tell my health care provider before I take this medicine? They need to know if you have any of these conditions: -bipolar disorder or a family history of bipolar disorder -glaucoma -kidney disease -liver disease -suicidal thoughts or a previous suicide attempt -taken medicines called MAOIs like Carbex, Eldepryl, Marplan, Nardil, and Parnate within 14 days -an unusual reaction to duloxetine, other medicines, foods, dyes, or preservatives -pregnant or trying to get pregnant -breast-feeding How should I use this medicine? Take this medicine by mouth with a glass of water. Follow the directions on the prescription label. Do not cut, crush or chew this medicine. You can take this medicine with or without food. Take your medicine at regular intervals. Do not take your medicine more often than directed. Do not stop taking this medicine suddenly except upon the advice of your doctor. Stopping this medicine too quickly may cause serious side effects or your condition may worsen. A special MedGuide will be given to you by the pharmacist with each prescription and refill. Be sure to read this information carefully each time. Talk to your pediatrician regarding the use of this medicine in children. While this drug may  be prescribed for children as young as 2 years of age for selected conditions, precautions do apply. Overdosage: If you think you have taken too much of this medicine contact a poison control center or emergency room at once. NOTE: This medicine is only for you. Do not share this medicine with others. What if I miss a dose? If you miss a dose, take it as soon as you can. If it is almost time for your next dose, take only that dose. Do not take double or extra doses. What may interact with this medicine? Do not take this medicine with any of the following medications: -desvenlafaxine -levomilnacipran -linezolid -MAOIs like Carbex, Eldepryl, Marplan, Nardil, and Parnate -methylene blue (injected into a vein) -milnacipran -thioridazine -venlafaxine This medicine may also interact with the following medications: -alcohol -amphetamines -aspirin and aspirin-like medicines -certain antibiotics like ciprofloxacin and enoxacin -certain medicines for blood pressure, heart disease, irregular heart beat -certain medicines for depression, anxiety, or psychotic disturbances -certain medicines for migraine headache like almotriptan, eletriptan, frovatriptan, naratriptan, rizatriptan, sumatriptan, zolmitriptan -certain medicines that treat or prevent blood clots like warfarin, enoxaparin, and dalteparin -cimetidine -fentanyl -lithium -NSAIDS, medicines for pain and inflammation, like ibuprofen or naproxen -phentermine -procarbazine -rasagiline -sibutramine -St. John's wort -theophylline -tramadol -tryptophan This list may not describe all possible interactions. Give your health care provider a list of all the medicines, herbs, non-prescription drugs, or dietary supplements you use. Also tell them if you smoke, drink alcohol, or use illegal drugs. Some items may interact with your medicine. What should I watch for while using this  medicine? Tell your doctor if your symptoms do not get better or  if they get worse. Visit your doctor or health care professional for regular checks on your progress. Because it may take several weeks to see the full effects of this medicine, it is important to continue your treatment as prescribed by your doctor. Patients and their families should watch out for new or worsening thoughts of suicide or depression. Also watch out for sudden changes in feelings such as feeling anxious, agitated, panicky, irritable, hostile, aggressive, impulsive, severely restless, overly excited and hyperactive, or not being able to sleep. If this happens, especially at the beginning of treatment or after a change in dose, call your health care professional. Dennis Bast may get drowsy or dizzy. Do not drive, use machinery, or do anything that needs mental alertness until you know how this medicine affects you. Do not stand or sit up quickly, especially if you are an older patient. This reduces the risk of dizzy or fainting spells. Alcohol may interfere with the effect of this medicine. Avoid alcoholic drinks. This medicine can cause an increase in blood pressure. This medicine can also cause a sudden drop in your blood pressure, which may make you feel faint and increase the chance of a fall. These effects are most common when you first start the medicine or when the dose is increased, or during use of other medicines that can cause a sudden drop in blood pressure. Check with your doctor for instructions on monitoring your blood pressure while taking this medicine. Your mouth may get dry. Chewing sugarless gum or sucking hard candy, and drinking plenty of water may help. Contact your doctor if the problem does not go away or is severe. What side effects may I notice from receiving this medicine? Side effects that you should report to your doctor or health care professional as soon as possible: -allergic reactions like skin rash, itching or hives, swelling of the face, lips, or  tongue -anxious -breathing problems -confusion -changes in vision -chest pain -confusion -elevated mood, decreased need for sleep, racing thoughts, impulsive behavior -eye pain -fast, irregular heartbeat -feeling faint or lightheaded, falls -feeling agitated, angry, or irritable -hallucination, loss of contact with reality -high blood pressure -loss of balance or coordination -palpitations -redness, blistering, peeling or loosening of the skin, including inside the mouth -restlessness, pacing, inability to keep still -seizures -stiff muscles -suicidal thoughts or other mood changes -trouble passing urine or change in the amount of urine -trouble sleeping -unusual bleeding or bruising -unusually weak or tired -vomiting -yellowing of the eyes or skin Side effects that usually do not require medical attention (report to your doctor or health care professional if they continue or are bothersome): -change in sex drive or performance -change in appetite or weight -constipation -dizziness -dry mouth -headache -increased sweating -nausea -tired This list may not describe all possible side effects. Call your doctor for medical advice about side effects. You may report side effects to FDA at 1-800-FDA-1088. Where should I keep my medicine? Keep out of the reach of children. Store at room temperature between 20 and 25 degrees C (68 to 77 degrees F). Throw away any unused medicine after the expiration date. NOTE: This sheet is a summary. It may not cover all possible information. If you have questions about this medicine, talk to your doctor, pharmacist, or health care provider.  2018 Elsevier/[employer] Standard (2016-03-28 18:16:03)

## 2018-10-06 LAB — HM DIABETES EYE EXAM

## 2018-10-06 NOTE — Telephone Encounter (Signed)
Pt evaluated by Dr Georgina Snell

## 2018-10-10 ENCOUNTER — Other Ambulatory Visit: Payer: Self-pay | Admitting: Physician Assistant

## 2018-10-10 DIAGNOSIS — I10 Essential (primary) hypertension: Secondary | ICD-10-CM

## 2018-10-25 ENCOUNTER — Other Ambulatory Visit: Payer: Self-pay | Admitting: Physician Assistant

## 2018-10-25 DIAGNOSIS — I10 Essential (primary) hypertension: Secondary | ICD-10-CM

## 2018-11-02 ENCOUNTER — Encounter: Payer: Self-pay | Admitting: Physician Assistant

## 2018-11-02 ENCOUNTER — Ambulatory Visit: Payer: Medicare FFS | Admitting: Physician Assistant

## 2018-11-02 VITALS — BP 138/52 | HR 69 | Temp 98.1°F | Ht 63.0 in | Wt 162.0 lb

## 2018-11-02 DIAGNOSIS — J0100 Acute maxillary sinusitis, unspecified: Secondary | ICD-10-CM

## 2018-11-02 DIAGNOSIS — E118 Type 2 diabetes mellitus with unspecified complications: Secondary | ICD-10-CM | POA: Diagnosis not present

## 2018-11-02 DIAGNOSIS — L600 Ingrowing nail: Secondary | ICD-10-CM

## 2018-11-02 DIAGNOSIS — I10 Essential (primary) hypertension: Secondary | ICD-10-CM

## 2018-11-02 DIAGNOSIS — B078 Other viral warts: Secondary | ICD-10-CM

## 2018-11-02 LAB — POCT GLYCOSYLATED HEMOGLOBIN (HGB A1C): Hemoglobin A1C: 6.2 % — AB (ref 4.0–5.6)

## 2018-11-02 MED ORDER — SALICYLIC ACID 27.5 % EX LIQD: CUTANEOUS | 1 refills | Status: DC

## 2018-11-02 MED ORDER — AMOXICILLIN-POT CLAVULANATE 875-125 MG PO TABS: 1.0000 | ORAL_TABLET | Freq: Two times a day (BID) | ORAL | 0 refills | Status: DC

## 2018-11-02 NOTE — Progress Notes (Signed)
Subjective:    Patient ID: Lindsey Fischer, female    DOB: January 21, 1941, 77 y.o.   MRN: [id number]  HPI  Pt is a 77 yo female with T2DM, HTN who presents to the clinic for 3 month follow up.   DM- pt is not checking sugars regularly but seem to be around 110 most of the time. No problems or concerns. No open wounds or sores.   She continues to have problems with a wart around her right thumb nail line. Has been frozen once and came back. Would like to know how to get rid of.   A few months ago she had a partial ingrown toenail removal. As it is growing out seems to be coming back. Would like referral.   She has had URI symptoms for the last 4-5 days. Cough, ST, congestion. She is concerned with it being christmaas that she will get sick.   .. Active Ambulatory Problems    Diagnosis Date Noted  . Dyslipidemia 01/19/2010  . GRIEF REACTION, ACUTE 03/27/2010  . ESSENTIAL HYPERTENSION, BENIGN 03/27/2010  . Asthma 01/19/2010  . Metabolic syndrome X 88/50/2774  . DM (diabetes mellitus) ([place]) 11/15/2012  . Umbilical hernia 12/87/8676  . Diverticulosis 12/20/2015  . Gastritis and gastroduodenitis 12/20/2015  . Hyperlipidemia 04/22/2016  . New daily persistent headache 10/22/2016  . Pure hypercholesterolemia 04/22/2017  . Elevated serum creatinine 04/23/2017  . Combined forms of age-related cataract of both eyes 05/27/2017  . 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 Posterior vitreous detachment of both eyes 05/27/2017  . CKD stage G3a/A2, GFR 45-59 and albumin creatinine ratio 30-299 mg/g ([place]) 06/03/2017  . Neck stiffness 08/20/2017  . Numbness 08/20/2017  . Chronic neck pain 01/16/2018  . 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 Cervical radiculopathy 01/29/2018  . Ingrown toenail of left foot 07/31/2018   Resolved Ambulatory Problems    Diagnosis Date Noted  . Impaired fasting glucose 03/27/2010  . Hypoxemia 05/22/2010  . Pneumonia 05/06/2011  . Epigastric  abdominal tenderness with rebound tenderness 12/19/2015  . Non-intractable cyclical vomiting with nausea 12/19/2015  . Abdominal guarding 12/19/2015  . Diarrhea 12/19/2015  . Helicobacter pylori gastritis 12/20/2015  . Atypical chest pain 10/22/2016  . Neck muscle spasm 08/20/2017  . Other headache syndrome 08/20/2017   Past Medical History:  Diagnosis Date  . Asthma   . Diabetes mellitus   . Hypertension       Review of Systems See HPI.     Objective:   Physical Exam Vitals signs reviewed.  Constitutional:      Appearance: Normal appearance.  HENT:     Head: Normocephalic and atraumatic.     Right Ear: Tympanic membrane normal.     Left Ear: Tympanic membrane normal.     Nose: Congestion present.     Mouth/Throat:     Mouth: Mucous membranes are moist.     Comments: Tenderness over bilateral maxillary sinuses.  Eyes:     Conjunctiva/sclera: Conjunctivae normal.  Neck:     Musculoskeletal: Normal range of motion and neck supple.  Cardiovascular:     Rate and Rhythm: Normal rate and regular rhythm.  Pulmonary:     Effort: Pulmonary effort is normal.     Breath sounds: Normal breath sounds.  Skin:    Comments: Left great toe tenderness at nail base. No redness or warmth.   Wart growing under right thumb nail into tip of finger.   Neurological:  General: No focal deficit present.     Mental Status: She is alert and oriented to person, place, and time.  Psychiatric:        Mood and Affect: Mood normal.        Behavior: Behavior normal.           Assessment & Plan:  Marland KitchenMarland KitchenKynadi was seen today for follow-up.  Diagnoses and all orders for this visit:  Essential hypertension, benign  Type 2 diabetes mellitus with complication, without long-term current use of insulin (HCC) -     POCT glycosylated hemoglobin (Hb A1C)  Acute non-recurrent maxillary sinusitis -     amoxicillin-clavulanate (AUGMENTIN) 875-125 MG tablet; Take 1 tablet by mouth 2 (two) times  daily.  Other viral warts -     Salicylic Acid 55.9 % LIQD; Apply topically to wart every 2-3 days.  Ingrown toenail of left foot -     Ambulatory referral to Podiatry   .Marland Kitchen Results for orders placed or performed in visit on 11/02/18  POCT glycosylated hemoglobin (Hb A1C)  Result Value Ref Range   Hemoglobin A1C 6.2 (A) 4.0 - 5.6 %   HbA1c POC (<> result, manual entry)     HbA1c, POC (prediabetic range)     HbA1c, POC (controlled diabetic range)     a1c looks great.  Stay on same medications.  On statin.  Follow up in 3 months.  Vaccines up to date.  Encouraged to get eye exam.   Given salacylic acid for wart. Use pummel stone and duct tape as well.   Suspect URI moving into sinusitis at this point. Given abx if worsening or not improving in next 3 days. Discussed symptomatic care.   Referral made for toenail removal. I have done once and came back.

## 2018-11-04 ENCOUNTER — Encounter: Payer: Self-pay | Admitting: Physician Assistant

## 2018-11-20 ENCOUNTER — Encounter: Payer: Self-pay | Admitting: Physician Assistant

## 2018-12-24 ENCOUNTER — Telehealth: Payer: Self-pay

## 2019-01-14 ENCOUNTER — Ambulatory Visit: Payer: Medicare FFS | Admitting: Physician Assistant

## 2019-01-14 ENCOUNTER — Encounter: Payer: Self-pay | Admitting: Physician Assistant

## 2019-01-14 VITALS — BP 145/74 | HR 76 | Temp 98.1°F | Wt 166.0 lb

## 2019-01-14 DIAGNOSIS — R102 Pelvic and perineal pain: Secondary | ICD-10-CM | POA: Diagnosis not present

## 2019-01-14 DIAGNOSIS — R300 Dysuria: Secondary | ICD-10-CM | POA: Diagnosis not present

## 2019-01-14 LAB — POCT URINALYSIS DIPSTICK
Bilirubin, UA: NEGATIVE
Glucose, UA: NEGATIVE
Ketones, UA: NEGATIVE
NITRITE UA: NEGATIVE
PROTEIN UA: POSITIVE — AB
Spec Grav, UA: <=1.005 — AB (ref 1.010–1.025)
UROBILINOGEN UA: 0.2 U/dL
pH, UA: 5.5 (ref 5.0–8.0)

## 2019-01-14 MED ORDER — CEFUROXIME AXETIL 250 MG PO TABS: 250.0000 mg | ORAL_TABLET | Freq: Two times a day (BID) | ORAL | 0 refills | Status: DC

## 2019-01-14 NOTE — Progress Notes (Signed)
HPI:                                                                Lindsey Fischer is a 78 y.o. female who presents to [facility] today for dysuria  Pleasant 78 year old female with past medical history of diabetes presents with 2 days of dysuria and suprapubic pressure.  Describes burning when she urinates that is present with every urination.  She states symptoms are similar to urinary tract infection that she has had in the past.  She denies history of recurrent UTI.  Reports last UTI was approximately 10 years ago, but she did have to be hospitalized for this.  Past Medical History:  Diagnosis Date  . Asthma   . Combined forms of age-related cataract of both eyes 05/27/2017  . Diabetes mellitus    pre diabetes  . Hyperlipidemia   . Hypertension   . 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 Posterior vitreous detachment of both eyes 05/27/2017   Past Surgical History:  Procedure Laterality Date  . APPENDECTOMY    . CATARACT EXTRACTION Right   . OVARIAN CYST REMOVAL     Social History   Tobacco Use  . Smoking status: Former Smoker    Last attempt to quit: 11/11/1980    Years since quitting: 38.2  . Smokeless tobacco: Never Used  Substance Use Topics  . Alcohol use: No   family history includes COPD in her sister; Cancer in her brother and sister; Diabetes in her mother; Heart attack (age of onset: 19) in her father.    ROS: negative except as noted in the HPI  Medications: Current Outpatient Medications  Medication Sig Dispense Refill  . ACCU-CHEK AVIVA PLUS test strip USE TO TEST BLOOD SUGAR TWICE A DAY 100 each 2  . acetaminophen (TYLENOL) 500 MG tablet Take 1,000 mg by mouth every 6 (six) hours as needed.    Marland Kitchen albuterol (VENTOLIN HFA) 108 (90 Base) MCG/ACT inhaler inhale 2 puffs by mouth once daily as directed 18 g 2  . AMBULATORY NON FORMULARY MEDICATION Nebulizer machine and tubing  Dx:  COPD exacerbation 1 Units 0  .  AMBULATORY NON FORMULARY MEDICATION 2 (two) times daily. Osteobiflex    . AMBULATORY NON FORMULARY MEDICATION Accu-Check Aviva Plus test strips Check blood sugar twice a day Dx: E11.8 100 each 1  . aspirin 325 MG EC tablet Take 1,300 mg by mouth as needed for pain.    . Aspirin-Caffeine 400-32 MG TABS Take 1 tablet by mouth every 8 (eight) hours as needed (pain). 120 each 0  . cefUROXime (CEFTIN) 250 MG tablet Take 1 tablet (250 mg total) by mouth 2 (two) times daily with a meal for 7 days. 14 tablet 0  . DULoxetine (CYMBALTA) 20 MG capsule Take 1 capsule (20 mg total) by mouth daily. 30 capsule 3  . ferrous sulfate 325 (65 FE) MG tablet Take 325 mg by mouth daily with breakfast.    . gabapentin (NEURONTIN) 100 MG capsule Take one tablet at bedtime for 5 days then increase to one tablet in the morning and evening. 60 capsule 3  . ipratropium-albuterol (DUONEB) 0.5-2.5 (3) MG/3ML SOLN Take 3 mLs by nebulization every 6 (six) hours as needed.  360 mL 1  . lisinopril-hydrochlorothiazide (PRINZIDE,ZESTORETIC) 20-25 MG tablet TAKE 1 TABLET BY MOUTH EVERY DAY 90 tablet 1  . magnesium 30 MG tablet Take 30 mg by mouth daily.    . metFORMIN (GLUCOPHAGE) 500 MG tablet TAKE 1 TABLET BY MOUTH EVERY DAY WITH BREAKFAST 90 tablet 1  . omeprazole (PRILOSEC) 40 MG capsule Take 1 capsule (40 mg total) by mouth daily. 90 capsule 3  . pravastatin (PRAVACHOL) 40 MG tablet TAKE 1 TABLET BY MOUTH EVERY DAY 90 tablet 1  . Salicylic Acid 51.7 % LIQD Apply topically to wart every 2-3 days. 50 mL 1   No current facility-administered medications for this visit.    Allergies  Allergen Reactions  . Peanut-Containing Drug Products Shortness Of Breath    Restricted breathing  . Livalo [Pitavastatin] Other (See Comments)    Muscle cramps  . Mucinex [Guaifenesin Er] Other (See Comments)    Made blood sugar go up to 212.   . Pravastatin Other (See Comments)    Muscle cramps  . Prednisone Other (See Comments)    Bloating  and abdominal pain, with oral product only.  . Zocor [Simvastatin] Other (See Comments)    Muscle cramps   . Tramadol     Somnolence.        Objective:  BP (!) 145/74   Pulse 76   Temp 98.1 F (36.7 C) (Oral)   Wt 166 lb (75.3 kg)   BMI 29.41 kg/m  Gen:  alert, not ill-appearing, no distress, appropriate for age 11: head normocephalic without obvious abnormality, conjunctiva and cornea clear, trachea midline Pulm: Normal work of breathing, normal phonation clicks or rubs  GI: Abdomen soft, nontender, no CVA tenderness Neuro: alert and oriented x 3, no tremor MSK: extremities atraumatic, normal gait and station Skin: intact, no rashes on exposed skin, no jaundice, no cyanosis     Results for orders placed or performed in visit on 01/14/19 (from the past 72 hour(s))  POCT Urinalysis Dipstick     Status: Abnormal   Collection Time: 01/14/19  2:23 PM  Result Value Ref Range   Color, UA yellow    Clarity, UA clear    Glucose, UA Negative Negative   Bilirubin, UA negative    Ketones, UA negative    Spec Grav, UA <=1.005 (A) 1.010 - 1.025   Blood, UA large    pH, UA 5.5 5.0 - 8.0   Protein, UA Positive (A) Negative   Urobilinogen, UA 0.2 0.2 or 1.0 E.U./dL   Nitrite, UA negative    Leukocytes, UA Small (1+) (A) Negative   Appearance     Odor     No results found.    Assessment and Plan: 78 y.o. female with   .Aspynn was seen today for abdominal pain.  Diagnoses and all orders for this visit:  Dysuria -     POCT Urinalysis Dipstick -     Urine Culture -     cefUROXime (CEFTIN) 250 MG tablet; Take 1 tablet (250 mg total) by mouth 2 (two) times daily with a meal for 7 days.  Suprapubic pressure -     POCT Urinalysis Dipstick -     Urine Culture   Point-of-care urinalysis positive for large quantity of blood, small leukocytes Afebrile, no tachypnea, no tachycardia, benign abdominal exam, no CVA tenderness, No nausea, vomiting, malaise, or flank pain to  suggest pyelonephritis Treating empirically for uncomplicated cystitis with cefuroxime twice daily for 7 days Urine culture pending  Patient education and anticipatory guidance given Patient agrees with treatment plan Follow-up as needed if symptoms worsen or fail to improve  Darlyne Russian PA-C

## 2019-01-14 NOTE — Patient Instructions (Signed)

## 2019-01-16 LAB — URINE CULTURE
MICRO NUMBER: 282255
SPECIMEN QUALITY: ADEQUATE

## 2019-01-19 ENCOUNTER — Other Ambulatory Visit: Payer: Self-pay | Admitting: Physician Assistant

## 2019-01-19 DIAGNOSIS — N3000 Acute cystitis without hematuria: Secondary | ICD-10-CM

## 2019-01-19 MED ORDER — NITROFURANTOIN MONOHYD MACRO 100 MG PO CAPS: 100.0000 mg | ORAL_CAPSULE | Freq: Two times a day (BID) | ORAL | 0 refills | Status: AC

## 2019-02-04 NOTE — Telephone Encounter (Signed)
Error

## 2019-02-22 ENCOUNTER — Other Ambulatory Visit: Payer: Self-pay | Admitting: Physician Assistant

## 2019-02-22 DIAGNOSIS — E7800 Pure hypercholesterolemia, unspecified: Secondary | ICD-10-CM

## 2019-03-03 ENCOUNTER — Other Ambulatory Visit: Payer: Self-pay | Admitting: Neurology

## 2019-03-03 MED ORDER — GLUCOSE BLOOD VI STRP: ORAL_STRIP | 2 refills | Status: DC

## 2019-03-05 ENCOUNTER — Other Ambulatory Visit: Payer: Self-pay | Admitting: Physician Assistant

## 2019-03-05 DIAGNOSIS — J4520 Mild intermittent asthma, uncomplicated: Secondary | ICD-10-CM

## 2019-03-11 ENCOUNTER — Other Ambulatory Visit: Payer: Self-pay | Admitting: Physician Assistant

## 2019-03-22 ENCOUNTER — Other Ambulatory Visit: Payer: Self-pay | Admitting: Physician Assistant

## 2019-03-22 DIAGNOSIS — J4520 Mild intermittent asthma, uncomplicated: Secondary | ICD-10-CM

## 2019-03-22 DIAGNOSIS — E118 Type 2 diabetes mellitus with unspecified complications: Secondary | ICD-10-CM

## 2019-05-04 ENCOUNTER — Encounter: Payer: Self-pay | Admitting: Physician Assistant

## 2019-05-04 ENCOUNTER — Ambulatory Visit (INDEPENDENT_AMBULATORY_CARE_PROVIDER_SITE_OTHER): Payer: Medicare PPO | Admitting: Physician Assistant

## 2019-05-04 VITALS — BP 132/78 | HR 73 | Temp 98.3°F | Ht 62.5 in | Wt 163.0 lb

## 2019-05-04 DIAGNOSIS — E118 Type 2 diabetes mellitus with unspecified complications: Secondary | ICD-10-CM | POA: Diagnosis not present

## 2019-05-04 DIAGNOSIS — M47812 Spondylosis without myelopathy or radiculopathy, cervical region: Secondary | ICD-10-CM

## 2019-05-04 DIAGNOSIS — M542 Cervicalgia: Secondary | ICD-10-CM

## 2019-05-04 DIAGNOSIS — G8929 Other chronic pain: Secondary | ICD-10-CM

## 2019-05-04 DIAGNOSIS — E782 Mixed hyperlipidemia: Secondary | ICD-10-CM

## 2019-05-04 DIAGNOSIS — E785 Hyperlipidemia, unspecified: Secondary | ICD-10-CM | POA: Diagnosis not present

## 2019-05-04 DIAGNOSIS — I10 Essential (primary) hypertension: Secondary | ICD-10-CM | POA: Diagnosis not present

## 2019-05-04 LAB — POCT GLYCOSYLATED HEMOGLOBIN (HGB A1C): Hemoglobin A1C: 6.5 % — AB (ref 4.0–5.6)

## 2019-05-04 MED ORDER — METFORMIN HCL 500 MG PO TABS: ORAL_TABLET | ORAL | 1 refills | Status: DC

## 2019-05-04 MED ORDER — LISINOPRIL-HYDROCHLOROTHIAZIDE 20-25 MG PO TABS: 1.0000 | ORAL_TABLET | Freq: Every day | ORAL | 1 refills | Status: DC

## 2019-05-04 NOTE — Progress Notes (Signed)
Subjective:    Patient ID: Lindsey Fischer, female    DOB: 12/12/1940, 78 y.o.   MRN: [id number]  HPI Pt is a 78 yo female with T2DM, dyslipidemia, asthma, HTN, chronic neck pain who presents to the clinic for 6 month follow up.   Overall she is doing good. She continues to work. She denies any hypoglycemic events. She checks sugars in am and running around 120's. Taking metformin daily.   She continues to have neck pain but much more barable. She has been to PT, 3 injections, nerve block, chiropractor, massages, tens units with no resolution. She does think tens unit helps the best.   .. Active Ambulatory Problems    Diagnosis Date Noted  . Dyslipidemia 01/19/2010  . GRIEF REACTION, ACUTE 03/27/2010  . ESSENTIAL HYPERTENSION, BENIGN 03/27/2010  . Asthma 01/19/2010  . Metabolic syndrome X 35/00/9381  . DM (diabetes mellitus) ([place]) 11/15/2012  . Umbilical hernia 82/99/3716  . Diverticulosis 12/20/2015  . Gastritis and gastroduodenitis 12/20/2015  . Hyperlipidemia 04/22/2016  . New daily persistent headache 10/22/2016  . Pure hypercholesterolemia 04/22/2017  . Elevated serum creatinine 04/23/2017  . Combined forms of age-related cataract of both eyes 05/27/2017  . 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 Posterior vitreous detachment of both eyes 05/27/2017  . CKD stage G3a/A2, GFR 45-59 and albumin creatinine ratio 30-299 mg/g ([place]) 06/03/2017  . Neck stiffness 08/20/2017  . Numbness 08/20/2017  . Chronic neck pain 01/16/2018  . 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 Cervical radiculopathy 01/29/2018  . Ingrown toenail of left foot 07/31/2018   Resolved Ambulatory Problems    Diagnosis Date Noted  . Impaired fasting glucose 03/27/2010  . Hypoxemia 05/22/2010  . Pneumonia 05/06/2011  . Epigastric abdominal tenderness with rebound tenderness 12/19/2015  . Non-intractable cyclical vomiting with nausea 12/19/2015  . Abdominal guarding 12/19/2015  . Diarrhea  12/19/2015  . Helicobacter pylori gastritis 12/20/2015  . Atypical chest pain 10/22/2016  . Neck muscle spasm 08/20/2017  . Other headache syndrome 08/20/2017   Past Medical History:  Diagnosis Date  . Asthma   . Diabetes mellitus   . Hypertension       Review of Systems  All other systems reviewed and are negative.      Objective:   Physical Exam Vitals signs reviewed.  Constitutional:      Appearance: Normal appearance.  HENT:     Head: Normocephalic.  Neck:     Comments: Decreased ROM of neck.   Cardiovascular:     Rate and Rhythm: Normal rate and regular rhythm.     Pulses: Normal pulses.  Pulmonary:     Effort: Pulmonary effort is normal.     Breath sounds: Normal breath sounds.  Neurological:     General: No focal deficit present.     Mental Status: She is alert and oriented to person, place, and time.  Psychiatric:        Mood and Affect: Mood normal.           Assessment & Plan:  Marland KitchenMarland KitchenAnquinette was seen today for diabetes.  Diagnoses and all orders for this visit:  Type 2 diabetes mellitus with complication, without long-term current use of insulin (HCC) -     POCT glycosylated hemoglobin (Hb A1C) -     COMPLETE METABOLIC PANEL WITH GFR -     metFORMIN (GLUCOPHAGE) 500 MG tablet; TAKE 1 TABLET BY MOUTH EVERY DAY WITH BREAKFAST  Essential hypertension, benign -  COMPLETE METABOLIC PANEL WITH GFR -     lisinopril-hydrochlorothiazide (ZESTORETIC) 20-25 MG tablet; Take 1 tablet by mouth daily.  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 Results for orders placed or performed in visit on 05/04/19  POCT glycosylated hemoglobin (Hb A1C)  Result Value Ref Range   Hemoglobin A1C 6.5 (A) 4.0 - 5.6 %   HbA1c POC (<> result, manual entry)     HbA1c, POC (prediabetic range)     HbA1c, POC (controlled diabetic range)     A!C is stable.  Stay on same medications. Refills given.  On statin. Ordered Lipid level.  On ACE.  Vaccine up to date.  Eye exam up to date.   Continue to use symptomatic control of neck pain. Consider good supportive pillow. Continue tens unit. Icy hot patches. Regular massage.   Follow up in 6 months.

## 2019-05-05 ENCOUNTER — Encounter: Payer: Self-pay | Admitting: Physician Assistant

## 2019-05-05 LAB — COMPLETE METABOLIC PANEL WITH GFR
AG Ratio: 1.7 (calc) (ref 1.0–2.5)
ALT: 12 U/L (ref 6–29)
AST: 18 U/L (ref 10–35)
Albumin: 4 g/dL (ref 3.6–5.1)
Alkaline phosphatase (APISO): 51 U/L (ref 37–153)
BUN/Creatinine Ratio: 26 (calc) — ABNORMAL HIGH (ref 6–22)
BUN: 28 mg/dL — ABNORMAL HIGH (ref 7–25)
CO2: 27 mmol/L (ref 20–32)
Calcium: 9.6 mg/dL (ref 8.6–10.4)
Chloride: 106 mmol/L (ref 98–110)
Creat: 1.07 mg/dL — ABNORMAL HIGH (ref 0.60–0.93)
GFR, Est African American: 58 mL/min/{1.73_m2} — ABNORMAL LOW (ref >=60)
GFR, Est Non African American: 50 mL/min/{1.73_m2} — ABNORMAL LOW (ref >=60)
Globulin: 2.3 g/dL (calc) (ref 1.9–3.7)
Glucose, Bld: 121 mg/dL — ABNORMAL HIGH (ref 65–99)
Potassium: 4.2 mmol/L (ref 3.5–5.3)
Sodium: 140 mmol/L (ref 135–146)
Total Bilirubin: 0.4 mg/dL (ref 0.2–1.2)
Total Protein: 6.3 g/dL (ref 6.1–8.1)

## 2019-05-05 LAB — LIPID PANEL W/REFLEX DIRECT LDL
Cholesterol: 222 mg/dL — ABNORMAL HIGH (ref <200)
HDL: 44 mg/dL — ABNORMAL LOW (ref >=50)
LDL Cholesterol (Calc): 136 mg/dL (calc) — ABNORMAL HIGH
Non-HDL Cholesterol (Calc): 178 mg/dL (calc) — ABNORMAL HIGH (ref <130)
Total CHOL/HDL Ratio: 5 (calc) — ABNORMAL HIGH (ref <5.0)
Triglycerides: 276 mg/dL — ABNORMAL HIGH (ref <150)

## 2019-05-05 NOTE — Progress Notes (Signed)
Cholesterol is not to goal. LDL should be 70 or under. I know you didn't tolerate some statins. What are you thoughts about trying another one that works a little better?  Liver looks good.  Kidney function is down a little more than usual. Make sure staying hydrated. Avoid OTC NSAIDs. Will recheck in 3 months.

## 2019-05-10 MED ORDER — ATORVASTATIN CALCIUM 40 MG PO TABS: 40.0000 mg | ORAL_TABLET | Freq: Every day | ORAL | 3 refills | Status: DC

## 2019-05-10 NOTE — Progress Notes (Signed)
I sent lipitor. Start every other day to make sure you tolerate it then after 2 weeks can move up to daily if tolerated.

## 2019-05-10 NOTE — Addendum Note (Signed)
Addended by: [name] on: 05/10/2019 02:26 PM   Modules accepted: Orders

## 2019-05-15 ENCOUNTER — Other Ambulatory Visit: Payer: Self-pay | Admitting: Physician Assistant

## 2019-05-17 IMAGING — MR MR CERVICAL SPINE W/O CM
5 series · 34 of 48 positions shown · non-contrast
Comparison: None.

ADDENDUM:
Correction: Impression #2 should read "Severe left facet arthritis
at C6-7 with a small effusion and bone edema without neural
impingement".
CLINICAL DATA: Neck pain and stiffness. Right shoulder and upper
arm pain.

EXAM:
MRI CERVICAL SPINE WITHOUT CONTRAST
TECHNIQUE: Multiplanar, multisequence MR imaging of the cervical spine was
performed. No intravenous contrast was administered.

[Series 2: T2 · sagittal · 3.0mm · 0.56mm/px · 8 of 13 slices shown (1 of 2)]
[im 1/13]
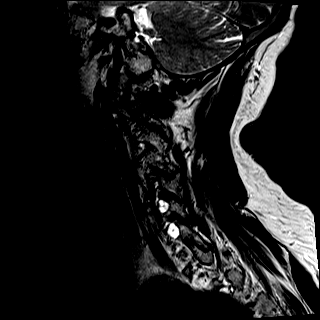
[im 2/13]
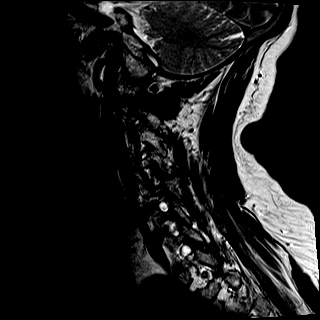
[im 4/13]
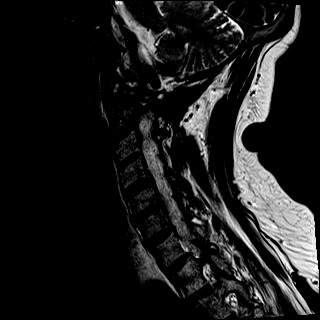
[im 6/13]
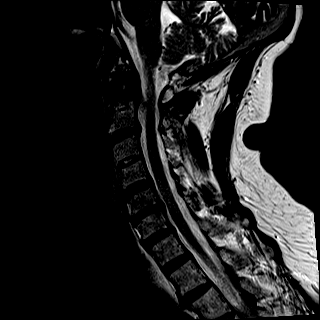
[im 7/13]
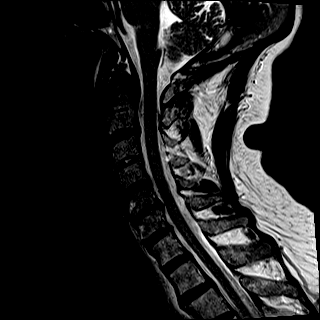
[im 9/13]
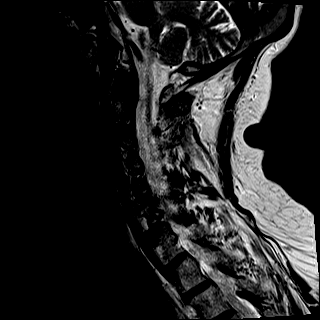
[im 11/13]
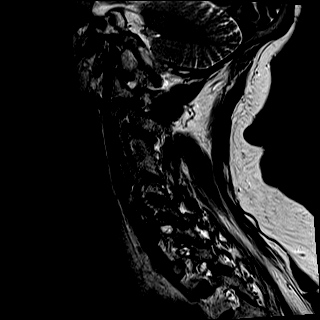
[im 13/13]
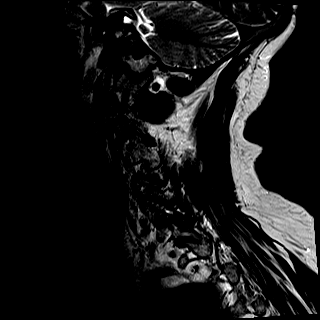

[Series 3: T1 · sagittal · 3.0mm · 0.70mm/px · 7 of 13 slices shown]
[im 1/13]
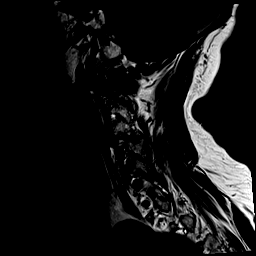
[im 3/13]
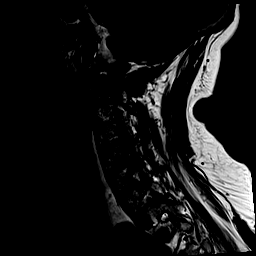
[im 5/13]
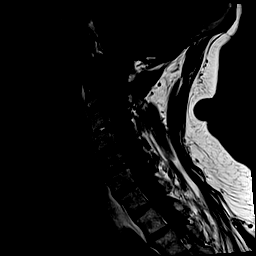
[im 7/13]
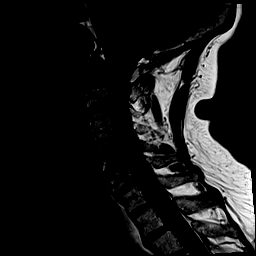
[im 9/13]
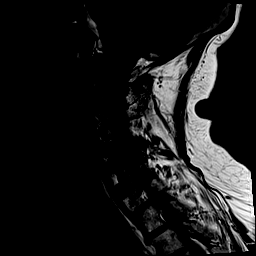
[im 11/13]
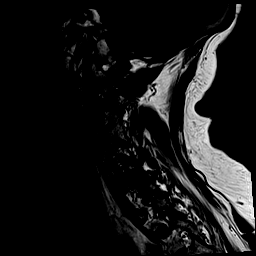
[im 13/13]
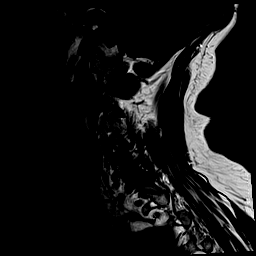

[Series 4: STIR · sagittal · 3.0mm · 0.35mm/px · 7 of 13 slices shown]
[im 1/13]
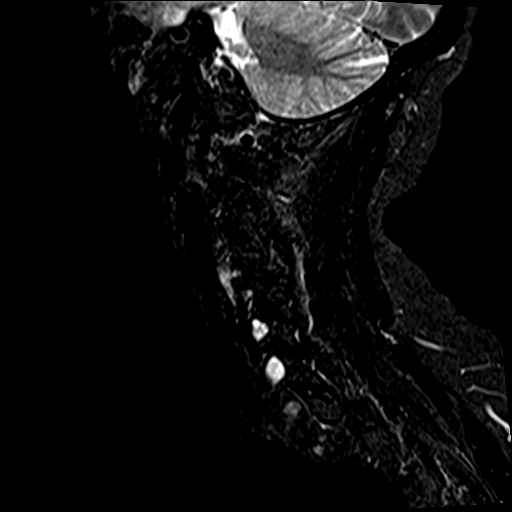
[im 3/13]
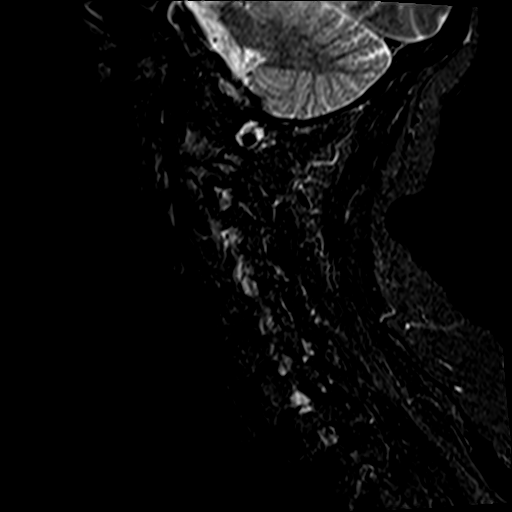
[im 5/13]
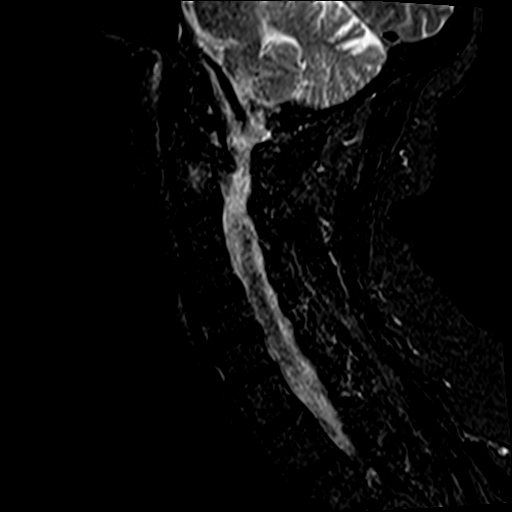
[im 7/13]
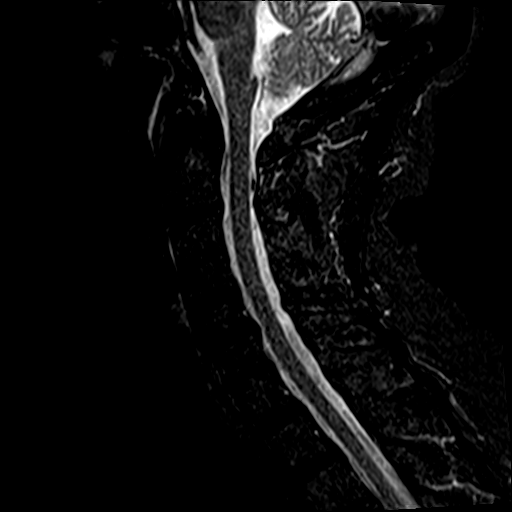
[im 9/13]
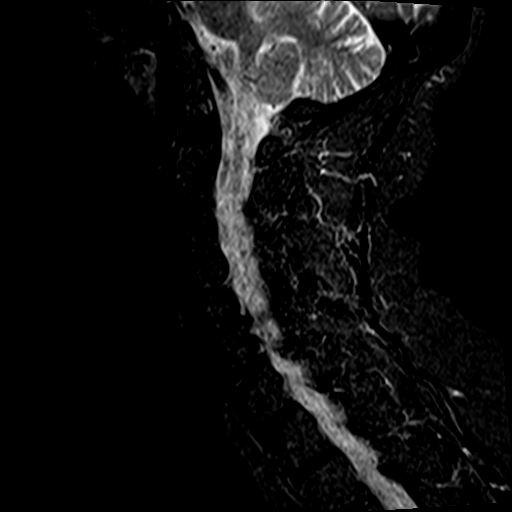
[im 11/13]
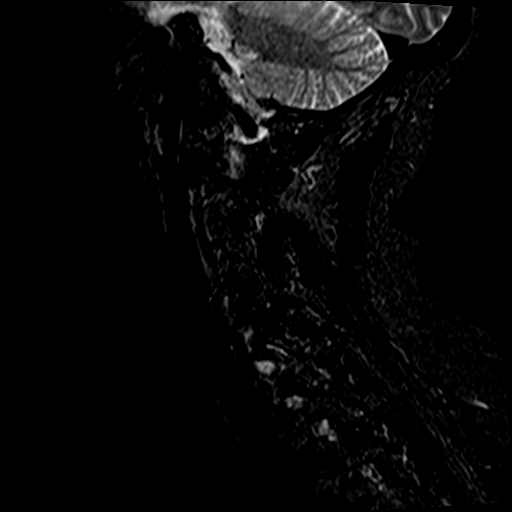
[im 13/13]
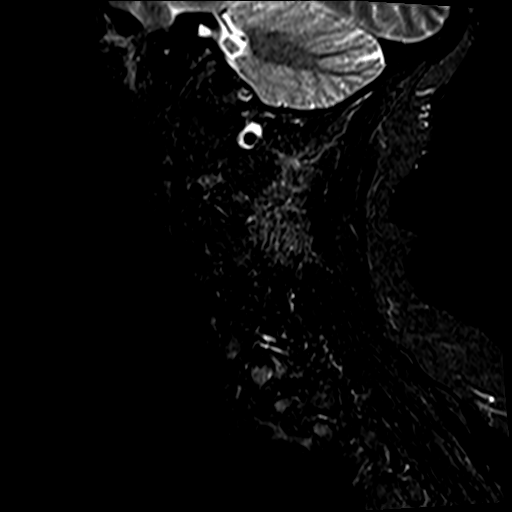

[Series 5: T2 · axial · 3.0mm · 0.62mm/px · z∈[-96,-12]mm · 9 of 25 slices shown (2 of 2)]
[im 1/25]
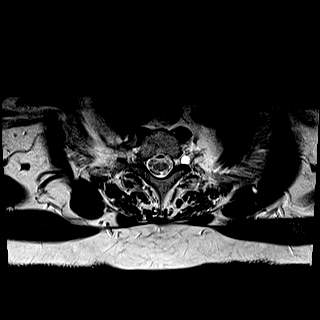
[im 5/25]
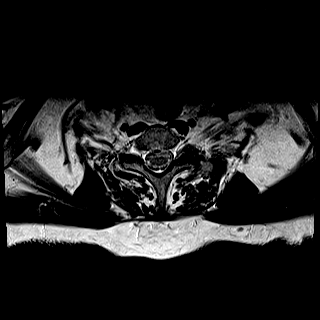
[im 9/25]
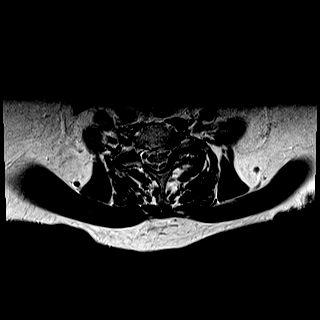
[im 11/25]
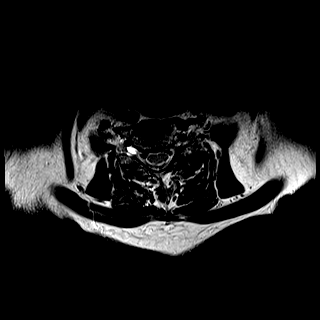
[im 13/25]
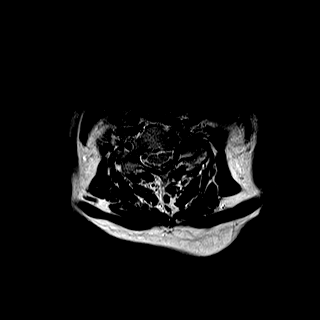
[im 15/25]
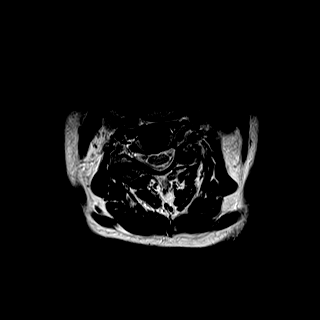
[im 17/25]
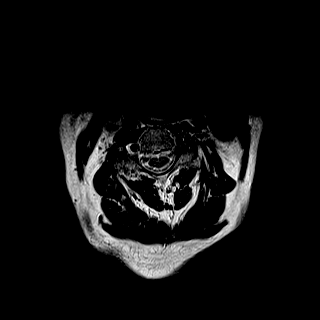
[im 21/25]
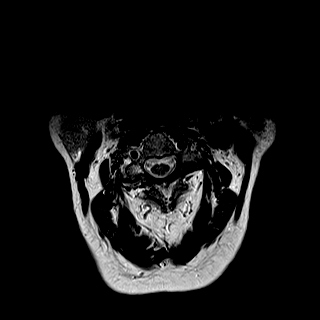
[im 25/25]
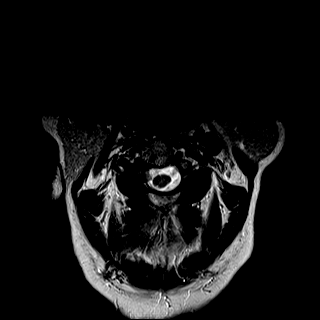

[Series 6: mpgr ax · axial · 3.0mm · 0.39mm/px · z∈[-85,-57]mm · 3 of 25 slices shown]
[im 1/25]
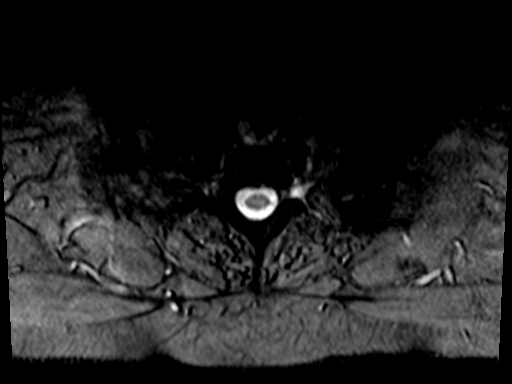
[im 5/25]
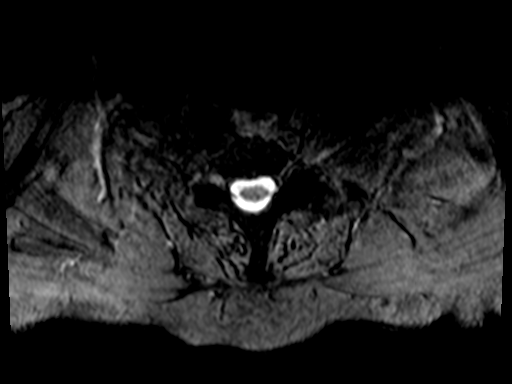
[im 9/25]
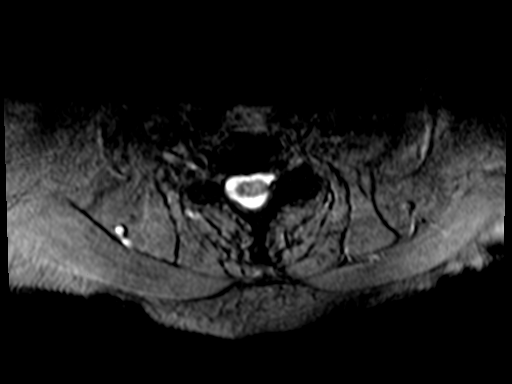

[34 of 48 positions shown; findings below may reference images not displayed]

FINDINGS: Alignment: Physiologic.

Vertebrae: Prominent arthritis at the right C1-2 articulation with
bone edema, best seen on images 3-4 of series 4. Osteophytes
protrude into both neural foramina, more prominent on the right than
the left.The facet joints at C3-4 are fused bilaterally. Severe left
facet arthritis at C6-7.

Cord: Normal signal and morphology.

Posterior Fossa, vertebral arteries, paraspinal tissues: Negative.

Disc levels:

C2-3: Small broad-based disc bulge without neural impingement.
Moderate bilateral facet arthritis. No foraminal stenosis.

C3-4: Disc space narrowing. No disc bulging or protrusion. Fusion of
the facet joints bilaterally. No foraminal stenosis.

C4-5: Tiny disc bulge with accompanying osteophytes to the right of
midline with no neural impingement. Moderate right facet arthritis.
Slight left facet arthritis. No foraminal stenosis.

C5-6: Disc space narrowing. Small broad-based disc osteophyte
complex with slight encroachment upon the left lateral recess. No
foraminal stenosis. Moderate left facet arthritis. Ectatic nerve
root sleeve on the right.

C6-7: Tiny broad-based disc bulge with no neural impingement. Widely
patent neural foramina. Severe left facet arthritis. Ectatic nerve
root sleeves bilaterally.

C7-T1: Tiny central disc bulge with no neural impingement. Moderate
bilateral facet arthritis, left greater than right. No foraminal
stenosis.
IMPRESSION: 1. Prominent arthritis at the right C1-2 articulation with bone
edema. Bilateral foraminal stenosis at C1-2, right greater than
left.
2. Severe left facet arthritis at C5-6 with a small effusion and
bone edema without neural impingement.
3. Fusion of the facet joints at C3-4.

## 2019-05-28 ENCOUNTER — Other Ambulatory Visit: Payer: Self-pay | Admitting: Physician Assistant

## 2019-06-28 ENCOUNTER — Other Ambulatory Visit: Payer: Self-pay

## 2019-06-28 ENCOUNTER — Emergency Department (INDEPENDENT_AMBULATORY_CARE_PROVIDER_SITE_OTHER): Payer: Medicare PPO

## 2019-06-28 ENCOUNTER — Emergency Department (INDEPENDENT_AMBULATORY_CARE_PROVIDER_SITE_OTHER)
Admission: EM | Admit: 2019-06-28 | Discharge: 2019-06-28 | Disposition: A | Discharge status: Home / Self Care | Payer: Medicare PPO | Source: Home / Self Care

## 2019-06-28 DIAGNOSIS — J189 Pneumonia, unspecified organism: Secondary | ICD-10-CM | POA: Diagnosis not present

## 2019-06-28 DIAGNOSIS — J440 Chronic obstructive pulmonary disease with acute lower respiratory infection: Secondary | ICD-10-CM | POA: Diagnosis not present

## 2019-06-28 MED ORDER — AZITHROMYCIN 250 MG PO TABS: 250.0000 mg | ORAL_TABLET | Freq: Every day | ORAL | 0 refills | Status: DC

## 2019-06-28 MED ORDER — CEFTRIAXONE SODIUM 1 G IJ SOLR
1.0000 g | Freq: Once | INTRAMUSCULAR | Status: AC
  Administered 2019-06-28: 1 g via INTRAMUSCULAR

## 2019-06-28 NOTE — ED Provider Notes (Signed)
[name]    CSN: [id number] Arrival date & time: 06/28/19  0930     History   Chief Complaint Chief Complaint  Patient presents with  . Fever  . Cough  . Weakness    HPI MARONDA CAISON is a 78 y.o. female.   HPI  ALDONA BRYNER is a 78 y.o. female presenting to UC with c/o 1 week of gradually worsening cough, congestion, fatigue/generalized weakness, and fever Tmax 100.7*F 3 days ago. 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    Past Medical History:  Diagnosis Date  . Asthma   . Combined forms of age-related cataract of both eyes 05/27/2017  . Diabetes mellitus    pre diabetes  . Hyperlipidemia   . Hypertension   . 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 Posterior vitreous detachment of both eyes 05/27/2017    Patient Active Problem List   Diagnosis Date Noted  . Ingrown toenail of left foot 07/31/2018  . 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 Cervical radiculopathy 01/29/2018  . Chronic neck pain 01/16/2018  . Neck stiffness 08/20/2017  . Numbness 08/20/2017  . CKD (chronic kidney disease) stage 3, GFR 30-59 ml/min (HCC) 06/03/2017  . Combined forms of age-related cataract of both eyes 05/27/2017  . 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 Posterior vitreous detachment of both eyes 05/27/2017  . Elevated serum creatinine 04/23/2017  . Pure hypercholesterolemia 04/22/2017  . New daily persistent headache 10/22/2016  . Hyperlipidemia 04/22/2016  . Diverticulosis 12/20/2015  . Gastritis and gastroduodenitis 12/20/2015  . Umbilical hernia 01/60/1093  . DM (diabetes mellitus) ([place]) 11/15/2012  . Metabolic syndrome X 23/55/7322  . GRIEF REACTION, ACUTE 03/27/2010  . ESSENTIAL HYPERTENSION, BENIGN 03/27/2010  . Dyslipidemia 01/19/2010  . Asthma 01/19/2010    Past Surgical History:  Procedure Laterality Date  . APPENDECTOMY    .  CATARACT EXTRACTION Right   . OVARIAN CYST REMOVAL      OB History   No obstetric history on file.      Home Medications    Prior to Admission medications   Medication Sig Start Date End Date Taking? Authorizing Provider  ACCU-CHEK AVIVA PLUS test strip USE TO TEST BLOOD SUGAR TWICE A DAY 05/28/19   [name], [name], PA-C  acetaminophen (TYLENOL) 500 MG tablet Take 1,000 mg by mouth every 6 (six) hours as needed.    [provider]  albuterol (VENTOLIN HFA) 108 (90 Base) MCG/ACT inhaler INHALE 2 PUFFS BY MOUTH ONCE DAILY AS DIRECTED 05/17/19   [name], [name], PA-C  AMBULATORY NON FORMULARY MEDICATION Nebulizer machine and tubing  Dx:  COPD exacerbation 05/06/11   Bowen, Collene Leyden, DO  AMBULATORY NON FORMULARY MEDICATION 2 (two) times daily. Osteobiflex    [provider]  AMBULATORY NON FORMULARY MEDICATION Accu-Check Aviva Plus test strips Check blood sugar twice a day Dx: E11.8 02/25/17   [name], PA-C  aspirin 325 MG EC tablet Take 1,300 mg by mouth as needed for pain.    [provider]  Aspirin-Caffeine 400-32 MG TABS Take 1 tablet by mouth every 8 (eight) hours as needed (pain). 03/17/18   Gregor Hams, MD  atorvastatin (LIPITOR) 40 MG tablet Take 1 tablet (40 mg total) by mouth daily. 05/10/19   [name], [name], PA-C  azithromycin (ZITHROMAX) 250 MG tablet Take 1 tablet (250  mg total) by mouth daily. Take first 2 tablets together, then 1 every day until finished. 06/28/19   Noe Gens, PA-C  ferrous sulfate 325 (65 FE) MG tablet Take 325 mg by mouth daily with breakfast.    [provider]  ipratropium-albuterol (DUONEB) 0.5-2.5 (3) MG/3ML SOLN TAKE 3 MLS BY NEBULIZATION EVERY 6 (SIX) HOURS AS NEEDED. 03/11/19   [name], [name], PA-C  lisinopril-hydrochlorothiazide (ZESTORETIC) 20-25 MG tablet Take 1 tablet by mouth daily. 05/04/19   [name], [name], PA-C  metFORMIN (GLUCOPHAGE) 500 MG tablet TAKE 1 TABLET BY MOUTH EVERY DAY WITH  BREAKFAST 05/04/19   [name], [name], PA-C  pravastatin (PRAVACHOL) 40 MG tablet TAKE 1 TABLET BY MOUTH EVERY DAY 02/22/19   [name], [name], PA-C  WIXELA INHUB 250-50 MCG/DOSE AEPB INHALE 1 PUFF INTO THE LUNGS EVERY 12 HOURS 03/23/19   [name], PA-C    Family History Family History  Problem Relation Age of Onset  . Diabetes Mother   . Heart attack Father 49       deceased  . Cancer Sister        brain tumor  . Cancer Brother        prostrate and lung CA  . COPD Sister     Social History Social History   Tobacco Use  . Smoking status: Former Smoker    Quit date: 11/11/1980    Years since quitting: 38.6  . Smokeless tobacco: Never Used  Substance Use Topics  . Alcohol use: No  . Drug use: No     Allergies   Peanut-containing drug products, Livalo [pitavastatin], Mucinex [guaifenesin er], Prednisone, Zocor [simvastatin], and Tramadol   Review of Systems Review of Systems  Constitutional: Positive for fatigue and fever. Negative for chills.  HENT: Positive for congestion. Negative for ear pain, sore throat, trouble swallowing and voice change.   Respiratory: Positive for cough. Negative for chest tightness and shortness of breath.   Cardiovascular: Negative for chest pain and palpitations.  Gastrointestinal: Negative for abdominal pain, diarrhea, nausea and vomiting.  Musculoskeletal: Negative for arthralgias, back pain and myalgias.  Skin: Negative for rash.  Neurological: Positive for weakness. Negative for dizziness, light-headedness and headaches.     Physical Exam Triage Vital Signs ED Triage Vitals  Enc Vitals Group     BP 06/28/19 0942 120/72     Pulse Rate 06/28/19 0942 80     Resp 06/28/19 0942 20     Temp 06/28/19 0942 98.4 F (36.9 C)     Temp Source 06/28/19 0942 Oral     SpO2 06/28/19 0942 94 %     Weight 06/28/19 0943 160 lb (72.6 kg)     Height 06/28/19 0943 5\' 3"  (1.6 m)     Head Circumference --      Peak Flow --      Pain Score  06/28/19 0943 0     Pain Loc --      Pain Edu? --      Excl. in [place]? --    No data found.  Updated Vital Signs BP 120/72 (BP Location: Right Arm)   Pulse 80   Temp 98.4 F (36.9 C) (Oral)   Resp 20   Ht 5\' 3"  (1.6 m)   Wt 160 lb (72.6 kg)   SpO2 94%   BMI 28.34 kg/m   Visual Acuity Right Eye Distance:   Left Eye Distance:   Bilateral Distance:    Right Eye Near:  Left Eye Near:    Bilateral Near:     Physical Exam Vitals signs and nursing note reviewed.  Constitutional:      General: She is not in acute distress.    Appearance: Normal appearance. She is well-developed. She is not ill-appearing, toxic-appearing or diaphoretic.  HENT:     Head: Normocephalic and atraumatic.     Right Ear: Tympanic membrane normal.     Left Ear: Tympanic membrane normal.     Nose: Nose normal.     Right Sinus: No maxillary sinus tenderness or frontal sinus tenderness.     Left Sinus: No maxillary sinus tenderness or frontal sinus tenderness.     Mouth/Throat:     Lips: Pink.     Mouth: Mucous membranes are moist.     Pharynx: Oropharynx is clear. Uvula midline.  Eyes:     Conjunctiva/sclera: Conjunctivae normal.  Neck:     Musculoskeletal: Normal range of motion.  Cardiovascular:     Rate and Rhythm: Normal rate and regular rhythm.  Pulmonary:     Effort: Pulmonary effort is normal. No respiratory distress.     Breath sounds: No stridor. Rhonchi (faint in lower lung fields) present. No wheezing or rales.  Musculoskeletal: Normal range of motion.  Skin:    General: Skin is warm and dry.     Findings: No rash.  Neurological:     Mental Status: She is alert and oriented to person, place, and time.  Psychiatric:        Behavior: Behavior normal.      UC Treatments / Results  Labs (all labs ordered are listed, but only abnormal results are displayed) Labs Reviewed - No data to display  EKG   Radiology Dg Chest 2 View  Result Date: 06/28/2019 CLINICAL DATA:  Cough,  chest congestion, fatigue and fever. Ex-smoker. EXAM: CHEST - 2 VIEW COMPARISON:  11/30/2017. FINDINGS: Stable normal sized heart and minimal linear scarring in the left lower lung zone. Mild patchy and linear density in the lingula with mild improvement. The remainder of the lungs are clear and mildly hyperexpanded. Stable mild diffuse peribronchial thickening and accentuation of the interstitial markings. Thoracic spine degenerative changes. Diffuse osteopenia. IMPRESSION: 1. Mild patchy atelectasis, scarring or pneumonia in the lingula with mild improvement. 2. Stable mild changes of COPD and chronic bronchitis. Electronically Signed   By: Claudie Revering M.D.   On: 06/28/2019 10:23    Procedures Procedures (including critical care time)  Medications Ordered in UC Medications  cefTRIAXone (ROCEPHIN) injection 1 g (1 g Intramuscular Given 06/28/19 1038)    Initial Impression / Assessment and Plan / UC Course  I have reviewed the triage vital signs and the nursing notes.  Pertinent labs & imaging results that were available during my care of the patient were reviewed by me and considered in my medical decision making (see chart for details).     Reviewed imaging with pt.   Given hx of pneumonia last year and reported gradually worsening symptoms for 1 week, will tx for CAP. Home care info provided.  Final Clinical Impressions(s) / UC Diagnoses   Final diagnoses:  Community acquired pneumonia of left lung, unspecified part of lung     Discharge Instructions      Please take antibiotics as prescribed and be sure to complete entire course even if you start to feel better to ensure infection does not come back.  You may take 500mg  acetaminophen every 4-6 hours for pain or fever.  Be sure to well hydrated with clear liquids and get at least 8 hours of sleep at night, preferably more while sick.   Please follow up with family medicine in 1 week if needed.     ED Prescriptions     Medication Sig Dispense Auth. Provider   azithromycin (ZITHROMAX) 250 MG tablet Take 1 tablet (250 mg total) by mouth daily. Take first 2 tablets together, then 1 every day until finished. 6 tablet Noe Gens, PA-C     Controlled Substance Prescriptions [place] Controlled Substance Registry consulted? Not Applicable   Tyrell Antonio 06/28/19 1048

## 2019-06-28 NOTE — ED Triage Notes (Signed)
Temperature up and down since Friday highest 100.7.  Has been taking aspirin.  Had a COVID test on Thursday, negative results.  Coughing, weak, and feels bad when has a fever.

## 2019-06-28 NOTE — Discharge Instructions (Addendum)
°  Please take antibiotics as prescribed and be sure to complete entire course even if you start to feel better to ensure infection does not come back.  You may take 500mg  acetaminophen every 4-6 hours for pain or fever.   Be sure to well hydrated with clear liquids and get at least 8 hours of sleep at night, preferably more while sick.   Please follow up with family medicine in 1 week if needed.

## 2019-07-02 ENCOUNTER — Ambulatory Visit (INDEPENDENT_AMBULATORY_CARE_PROVIDER_SITE_OTHER): Payer: Medicare PPO | Admitting: Physician Assistant

## 2019-07-02 ENCOUNTER — Encounter: Payer: Self-pay | Admitting: Physician Assistant

## 2019-07-02 VITALS — BP 121/52 | HR 86 | Temp 99.9°F | Ht 62.0 in | Wt 157.0 lb

## 2019-07-02 DIAGNOSIS — R110 Nausea: Secondary | ICD-10-CM | POA: Insufficient documentation

## 2019-07-02 DIAGNOSIS — R1030 Lower abdominal pain, unspecified: Secondary | ICD-10-CM | POA: Diagnosis not present

## 2019-07-02 DIAGNOSIS — K5790 Diverticulosis of intestine, part unspecified, without perforation or abscess without bleeding: Secondary | ICD-10-CM

## 2019-07-02 DIAGNOSIS — R1032 Left lower quadrant pain: Secondary | ICD-10-CM

## 2019-07-02 DIAGNOSIS — K429 Umbilical hernia without obstruction or gangrene: Secondary | ICD-10-CM

## 2019-07-02 DIAGNOSIS — R748 Abnormal levels of other serum enzymes: Secondary | ICD-10-CM

## 2019-07-02 LAB — POCT URINALYSIS DIPSTICK
Bilirubin, UA: NEGATIVE
Blood, UA: NEGATIVE
Glucose, UA: NEGATIVE
Ketones, UA: NEGATIVE
Leukocytes, UA: NEGATIVE
Nitrite, UA: NEGATIVE
Protein, UA: POSITIVE — AB
Spec Grav, UA: 1.015 (ref 1.010–1.025)
Urobilinogen, UA: 0.2 E.U./dL
pH, UA: 5.5 (ref 5.0–8.0)

## 2019-07-02 MED ORDER — CIPROFLOXACIN HCL 500 MG PO TABS: 500.0000 mg | ORAL_TABLET | Freq: Two times a day (BID) | ORAL | 0 refills | Status: DC

## 2019-07-02 MED ORDER — ONDANSETRON 8 MG PO TBDP: 8.0000 mg | ORAL_TABLET | Freq: Three times a day (TID) | ORAL | 1 refills | Status: DC | PRN

## 2019-07-02 MED ORDER — METRONIDAZOLE 500 MG PO TABS: 500.0000 mg | ORAL_TABLET | Freq: Three times a day (TID) | ORAL | 0 refills | Status: DC

## 2019-07-02 NOTE — Patient Instructions (Signed)

## 2019-07-02 NOTE — Progress Notes (Signed)
Subjective:    Patient ID: Lindsey Fischer, female    DOB: Aug 08, 1941, 78 y.o.   MRN: [id number]  HPI Pt is a 78 yo female with pmhx of  HTN, diverticulosis, gastritis who presents to the clinic with 2 week of left lower quadrant pain, diarrhea, nausea. She has been tested 3 times for COVID and negative. She is running a 101 temperature at home. She feels weak. She has no desire to eat. Eating does not make symptoms better or worse. She went to UC on 06/28/19 and dx with CAP and treated with zpak. She has finished zpak. She has no more cough or upper respiratory symptoms. She is not vomiting.   .. Active Ambulatory Problems    Diagnosis Date Noted  . Dyslipidemia 01/19/2010  . GRIEF REACTION, ACUTE 03/27/2010  . ESSENTIAL HYPERTENSION, BENIGN 03/27/2010  . Asthma 01/19/2010  . Metabolic syndrome X 123XX123  . DM (diabetes mellitus) ([place]) 11/15/2012  . Umbilical hernia [id number]  . Diverticulosis 12/20/2015  . Gastritis and gastroduodenitis 12/20/2015  . Hyperlipidemia 04/22/2016  . New daily persistent headache 10/22/2016  . Pure hypercholesterolemia 04/22/2017  . Elevated serum creatinine 04/23/2017  . Combined forms of age-related cataract of both eyes 05/27/2017  . 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 Posterior vitreous detachment of both eyes 05/27/2017  . CKD (chronic kidney disease) stage 3, GFR 30-59 ml/min (HCC) 06/03/2017  . Neck stiffness 08/20/2017  . Numbness 08/20/2017  . Chronic neck pain 01/16/2018  . 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 Cervical radiculopathy 01/29/2018  . Ingrown toenail of left foot 07/31/2018   Resolved Ambulatory Problems    Diagnosis Date Noted  . Impaired fasting glucose 03/27/2010  . Hypoxemia 05/22/2010  . Pneumonia 05/06/2011  . Epigastric abdominal tenderness with rebound tenderness 12/19/2015  . Non-intractable cyclical vomiting with nausea 12/19/2015  . Abdominal guarding 12/19/2015  . Diarrhea 12/19/2015   . Helicobacter pylori gastritis 12/20/2015  . Atypical chest pain 10/22/2016  . Neck muscle spasm 08/20/2017  . Other headache syndrome 08/20/2017   Past Medical History:  Diagnosis Date  . Asthma   . Diabetes mellitus   . Hypertension       Review of Systems See HPI.     Objective:   Physical Exam Vitals signs reviewed.  Constitutional:      Appearance: She is ill-appearing.  HENT:     Head: Normocephalic.  Cardiovascular:     Rate and Rhythm: Normal rate and regular rhythm.  Abdominal:     General: Bowel sounds are normal. There is distension.     Palpations: Abdomen is rigid.     Tenderness: There is abdominal tenderness in the left lower quadrant. There is guarding and rebound. There is no right CVA tenderness or left CVA tenderness.     Hernia: A hernia is present. Hernia is present in the umbilical area.  Skin:    General: Skin is warm.  Neurological:     General: No focal deficit present.     Mental Status: She is alert and oriented to person, place, and time.  Psychiatric:        Mood and Affect: Mood normal.           Assessment & Plan:  Marland KitchenMarland KitchenKeairah was seen today for abdominal pain.  Diagnoses and all orders for this visit:  Left lower quadrant abdominal pain -     COMPLETE METABOLIC PANEL WITH GFR -     CBC with Differential/Platelet -  Lipase -     metroNIDAZOLE (FLAGYL) 500 MG tablet; Take 1 tablet (500 mg total) by mouth 3 (three) times daily. -     ciprofloxacin (CIPRO) 500 MG tablet; Take 1 tablet (500 mg total) by mouth 2 (two) times daily. For 10 days.  Lower abdominal pain -     POCT Urinalysis Dipstick -     Urine Culture  Diverticulosis  Nausea -     ondansetron (ZOFRAN-ODT) 8 MG disintegrating tablet; Take 1 tablet (8 mg total) by mouth every 8 (eight) hours as needed for nausea.  .. Results for orders placed or performed in visit on 07/02/19  POCT Urinalysis Dipstick  Result Value Ref Range   Color, UA yellow    Clarity,  UA clear    Glucose, UA Negative Negative   Bilirubin, UA negative    Ketones, UA negative    Spec Grav, UA 1.015 1.010 - 1.025   Blood, UA negative    pH, UA 5.5 5.0 - 8.0   Protein, UA Positive (A) Negative   Urobilinogen, UA 0.2 0.2 or 1.0 E.U./dL   Nitrite, UA negative    Leukocytes, UA Negative Negative   Appearance     Odor     No sign of UTI. Will culture.   With hx of diverticulosis found on CT back in 2018 and left lower quadrant guarding/fever/diarreha/nausea will treat empirically with cipro/metronidazole for 10 days for diverticulitis. Pt is concerned about cost due to all her imaging for her neck pain. Added zofran for nausea. Will get CBC, CMP, lipase.  Hx of gastritis. Pt has at home omeprazole. Start that daily. No epigastric tenderness on exam I think less likely the culprit. Has umbilical hernia, reducible appears flesh color. No sign of strangulation.  If no improvement need CT of abd/pelvis.

## 2019-07-03 LAB — COMPLETE METABOLIC PANEL WITH GFR
AG Ratio: 1.6 (calc) (ref 1.0–2.5)
ALT: 112 U/L — ABNORMAL HIGH (ref 6–29)
AST: 135 U/L — ABNORMAL HIGH (ref 10–35)
Albumin: 4.1 g/dL (ref 3.6–5.1)
Alkaline phosphatase (APISO): 49 U/L (ref 37–153)
BUN/Creatinine Ratio: 21 (calc) (ref 6–22)
BUN: 29 mg/dL — ABNORMAL HIGH (ref 7–25)
CO2: 26 mmol/L (ref 20–32)
Calcium: 9.2 mg/dL (ref 8.6–10.4)
Chloride: 102 mmol/L (ref 98–110)
Creat: 1.37 mg/dL — ABNORMAL HIGH (ref 0.60–0.93)
GFR, Est African American: 43 mL/min/{1.73_m2} — ABNORMAL LOW (ref >=60)
GFR, Est Non African American: 37 mL/min/{1.73_m2} — ABNORMAL LOW (ref >=60)
Globulin: 2.5 g/dL (calc) (ref 1.9–3.7)
Glucose, Bld: 100 mg/dL — ABNORMAL HIGH (ref 65–99)
Potassium: 4.3 mmol/L (ref 3.5–5.3)
Sodium: 138 mmol/L (ref 135–146)
Total Bilirubin: 0.4 mg/dL (ref 0.2–1.2)
Total Protein: 6.6 g/dL (ref 6.1–8.1)

## 2019-07-03 LAB — CBC WITH DIFFERENTIAL/PLATELET
Absolute Monocytes: 455 cells/uL (ref 200–950)
Basophils Absolute: 21 cells/uL (ref 0–200)
Basophils Relative: 0.5 %
Eosinophils Absolute: 8 cells/uL — ABNORMAL LOW (ref 15–500)
Eosinophils Relative: 0.2 %
HCT: 35.8 % (ref 35.0–45.0)
Hemoglobin: 12.1 g/dL (ref 11.7–15.5)
Lymphs Abs: 1378 cells/uL (ref 850–3900)
MCH: 28.5 pg (ref 27.0–33.0)
MCHC: 33.8 g/dL (ref 32.0–36.0)
MCV: 84.2 fL (ref 80.0–100.0)
MPV: 11 fL (ref 7.5–12.5)
Monocytes Relative: 11.1 %
Neutro Abs: 2239 cells/uL (ref 1500–7800)
Neutrophils Relative %: 54.6 %
Platelets: 193 10*3/uL (ref 140–400)
RBC: 4.25 10*6/uL (ref 3.80–5.10)
RDW: 12.7 % (ref 11.0–15.0)
Total Lymphocyte: 33.6 %
WBC: 4.1 10*3/uL (ref 3.8–10.8)

## 2019-07-03 LAB — URINE CULTURE
MICRO NUMBER:: 798092
Result:: NO GROWTH
SPECIMEN QUALITY:: ADEQUATE

## 2019-07-03 LAB — LIPASE: Lipase: 66 U/L — ABNORMAL HIGH (ref 7–60)

## 2019-07-05 ENCOUNTER — Other Ambulatory Visit: Payer: Self-pay

## 2019-07-05 ENCOUNTER — Ambulatory Visit (INDEPENDENT_AMBULATORY_CARE_PROVIDER_SITE_OTHER): Payer: Medicare PPO | Admitting: Physician Assistant

## 2019-07-05 ENCOUNTER — Ambulatory Visit: Payer: Medicare PPO | Admitting: Physician Assistant

## 2019-07-05 ENCOUNTER — Ambulatory Visit (INDEPENDENT_AMBULATORY_CARE_PROVIDER_SITE_OTHER): Payer: Medicare PPO

## 2019-07-05 DIAGNOSIS — R110 Nausea: Secondary | ICD-10-CM

## 2019-07-05 DIAGNOSIS — R748 Abnormal levels of other serum enzymes: Secondary | ICD-10-CM

## 2019-07-05 DIAGNOSIS — R1032 Left lower quadrant pain: Secondary | ICD-10-CM

## 2019-07-05 DIAGNOSIS — J181 Lobar pneumonia, unspecified organism: Secondary | ICD-10-CM

## 2019-07-05 DIAGNOSIS — K5790 Diverticulosis of intestine, part unspecified, without perforation or abscess without bleeding: Secondary | ICD-10-CM

## 2019-07-05 DIAGNOSIS — R1030 Lower abdominal pain, unspecified: Secondary | ICD-10-CM

## 2019-07-05 DIAGNOSIS — J189 Pneumonia, unspecified organism: Secondary | ICD-10-CM

## 2019-07-05 MED ORDER — IOPAMIDOL (ISOVUE-300) INJECTION 61%: 80.0000 mL | Freq: Once | INTRAVENOUS | Status: DC | PRN

## 2019-07-05 MED ORDER — IOPAMIDOL (ISOVUE-300) INJECTION 61%
80.0000 mL | Freq: Once | INTRAVENOUS | Status: AC | PRN
  Administered 2019-07-05: 80 mL via INTRAVENOUS

## 2019-07-05 NOTE — Progress Notes (Signed)
l °

## 2019-07-05 NOTE — Addendum Note (Signed)
Addended by: [name] on: 07/05/2019 09:43 AM   Modules accepted: Orders

## 2019-07-05 NOTE — Progress Notes (Signed)
Kidney function up a little. Liver enzymes have gone way up. Lipase is not exteremly elevated but out of abnormal range. This could represent some mild pancreatitis.   How are you feeling today?   If no improvement. Need to get CT. If improving. Need to recheck labs in 1 week to make sure trending down.   No bacteria in urine.

## 2019-07-06 ENCOUNTER — Encounter: Payer: Self-pay | Admitting: Physician Assistant

## 2019-07-06 DIAGNOSIS — J189 Pneumonia, unspecified organism: Secondary | ICD-10-CM | POA: Insufficient documentation

## 2019-07-06 MED ORDER — DOXYCYCLINE HYCLATE 100 MG PO TABS: 100.0000 mg | ORAL_TABLET | Freq: Two times a day (BID) | ORAL | 0 refills | Status: DC

## 2019-07-06 NOTE — Progress Notes (Signed)
Patient ID: Lindsey Fischer, female   DOB: [birth year], 78 y.o.   MRN: [id number] .Marland KitchenVirtual Visit via Telephone Note  I connected with Lindsey Fischer on 07/06/19 at  4:00 PM EDT by telephone and verified that I am speaking with the correct person using two identifiers.  Location: Patient: home Provider: clinic   I discussed the limitations, risks, security and privacy concerns of performing an evaluation and management service by telephone and the availability of in person appointments. I also discussed with the patient that there may be a patient responsible charge related to this service. The patient expressed understanding and agreed to proceed.   History of Present Illness: Patient is a 78 year old woman who comes into the clinic today to go over CT of the abdomen results.  She has not felt well since beginning of August.  She has had 3- COVID test.  She did go to the ED on 8/17 and diagnosed her with pneumonia.  She was treated with Z-Pak.  She did not get much better.  She came into the clinic on 8/21 to be seen by me.  She was complaining more of left lower abdominal pain at this time.  She had finished her Z-Pak.  She had guarding in her lower abdomen.  She was treated empirically with metronidazole and Cipro for diverticulitis due to her known diverticulosis.  We did get labs that showed elevated lipase and elevated liver enzymes as well as elevated serum creatinine.  She did not get better therefore ordered a CT of the abdomen on Monday.  She is here to go over those results.  She continues to not feel good.  She is still nauseated and has a dry cough.  She continues to have abdominal pain.  She is very fatigued.    .. Active Ambulatory Problems    Diagnosis Date Noted  . Dyslipidemia 01/19/2010  . GRIEF REACTION, ACUTE 03/27/2010  . ESSENTIAL HYPERTENSION, BENIGN 03/27/2010  . Asthma 01/19/2010  . Metabolic syndrome X 123XX123  . DM (diabetes mellitus) ([place]) 11/15/2012  .  Umbilical hernia [id number]  . Diverticulosis 12/20/2015  . Gastritis and gastroduodenitis 12/20/2015  . Hyperlipidemia 04/22/2016  . New daily persistent headache 10/22/2016  . Pure hypercholesterolemia 04/22/2017  . Elevated serum creatinine 04/23/2017  . Combined forms of age-related cataract of both eyes 05/27/2017  . 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 Posterior vitreous detachment of both eyes 05/27/2017  . CKD (chronic kidney disease) stage 3, GFR 30-59 ml/min (HCC) 06/03/2017  . Neck stiffness 08/20/2017  . Numbness 08/20/2017  . Chronic neck pain 01/16/2018  . 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 Cervical radiculopathy 01/29/2018  . Ingrown toenail of left foot 07/31/2018  . Nausea 07/02/2019  . Pneumonia of both lower lobes due to infectious organism [facility]) 07/06/2019   Resolved Ambulatory Problems    Diagnosis Date Noted  . Impaired fasting glucose 03/27/2010  . Hypoxemia 05/22/2010  . Pneumonia 05/06/2011  . Epigastric abdominal tenderness with rebound tenderness 12/19/2015  . Non-intractable cyclical vomiting with nausea 12/19/2015  . Abdominal guarding 12/19/2015  . Diarrhea 12/19/2015  . Helicobacter pylori gastritis 12/20/2015  . Atypical chest pain 10/22/2016  . Neck muscle spasm 08/20/2017  . Other headache syndrome 08/20/2017   Past Medical History:  Diagnosis Date  . Asthma   . Diabetes mellitus   . Hypertension    Reviewed med, allergy, problem list.     Observations/Objective: No acute distress. No labored breathing.  Normal mood.   Vitals not obtained.   Assessment and Plan: Marland KitchenMarland KitchenDiagnoses and all orders for this visit:  Pneumonia of both lower lobes due to infectious organism ([place]) -     doxycycline (VIBRA-TABS) 100 MG tablet; Take 1 tablet (100 mg total) by mouth 2 (two) times daily. -     CBC with Differential/Platelet  Left lower quadrant abdominal pain  Nausea  Elevated liver enzymes -     COMPLETE METABOLIC  PANEL WITH GFR  Elevated lipase -     Lipase   CT abd/pelvis results.   Liver is within normal limits on CT. No intrahepatic or extrahepatic ductal dilatation.  Left colonic diverticulosis, without evidence of diverticulitis. No CT findings to account for the patient's left lower quadrant abdominal pain.  Mild ground-glass opacities at the lung bases, nonspecific and incompletely visualized. Atypical/viral pneumonia including COVID-19 is possible. These results will be called to the ordering clinician or representative by the Radiologist Assistant, and communication documented in the PACS or zVision Dashboard.  Explained to patient that her liver, pancreas, intestines had no acute findings.  The only abnormality was a bilateral groundglass opacities concerning for atypical or viral pneumonia and cannot exclude COVID-19.  She is already been treated with atypical coverage with Z-Pak.  I will send over doxycycline.  She should stop treatment for diverticulitis.  Patient is aware to not take anymore metronidazole or Cipro.  There is still a chance this is COVID.  She has been tested 3 times and all negative.  I do not think it is worth another test.  I do want patient to self isolate and treat this like COVID.  I would like to follow-up with a dedicated CT of chest in 1 week.  I would also like to recheck her liver, kidney, pancreas at the end of this week.  Spent time discussing worsening symptoms and if she would need to go to the ER.  For now rest and hydrate.  Patient was written out of work for the rest of this week and next until we get the CT of the chest.   Follow Up Instructions:    I discussed the assessment and treatment plan with the patient. The patient was provided an opportunity to ask questions and all were answered. The patient agreed with the plan and demonstrated an understanding of the instructions.   The patient was advised to call back or seek an in-person evaluation  if the symptoms worsen or if the condition fails to improve as anticipated.  I provided 25 minutes of non-face-to-face time during this encounter.   Iran Planas, PA-C

## 2019-07-08 ENCOUNTER — Ambulatory Visit (INDEPENDENT_AMBULATORY_CARE_PROVIDER_SITE_OTHER): Payer: Medicare PPO | Admitting: Family Medicine

## 2019-07-08 VITALS — BP 150/82 | Temp 98.8°F | Ht 62.0 in | Wt 157.0 lb

## 2019-07-08 DIAGNOSIS — U071 COVID-19: Secondary | ICD-10-CM | POA: Diagnosis not present

## 2019-07-08 DIAGNOSIS — J1289 Other viral pneumonia: Secondary | ICD-10-CM | POA: Diagnosis not present

## 2019-07-08 NOTE — Progress Notes (Signed)
Positive Covid test Wants advise Feels weak, some productive cough, SOB (has asthma)

## 2019-07-08 NOTE — Patient Instructions (Signed)
Thank you for coming in today.  I think your symptoms make a lot of sense for COVID-19. Finish out the antibiotics that [facility] prescribed.  Plan to get your labs checked again on Monday, September 7 unless you are getting worse or get sick again.  If you continue to get better I do not think there is need to get a CT scan of your lungs.  However if you are not getting better pretty quickly that is something that we will probably want to do.  It can take some time to get better from COVID-19.  Some people takes a few months to get the strength all the way back.  For some people with COVID-19 this can get worse starting around day 10-14.  That makes a lot of sense for you getting sick late last week.  However you are getting better now which is very reassuring to me.  I will make sure that Iran Planas PA knows what is going on and she is informed.  Please have your employer send me the test results of your COVID-19 test to fax number [phone].  Keep me updated if you get worse or something changes.  We are here for you and I am very happy that you are getting better and that we have a better idea of why you are sick in the first place.   Recommend continued self-isolation until around September 7 if getting better.

## 2019-07-08 NOTE — Progress Notes (Signed)
Virtual Visit  I connected with      Lindsey Fischer  by a telemedicine application and verified that I am speaking with the correct person using two identifiers.   I discussed the limitations of evaluation and management by telemedicine and the availability of in person appointments. The patient expressed understanding and agreed to proceed.  History of Present Illness: Lindsey Fischer is a 78 y.o. female who would like to discuss COVID-19.  Lindsey Fischer has been sick and evaluated multiple times since about September 10.  She has been to the emergency room and seen by her PCP Iran Planas PA several times.  She has had shortness of breath cough fatigue and some GI symptoms.  She had work-up including multiple COVID-19 test through her employer.  She had definite exposure to COVID-19 at work and had had multiple negative tests.  She had a recent test again which this time now was positive.  She was last seen by her PCP on August 24.  At that time she had lab work-up which was significant for elevated lipase and liver enzymes.  She had CT scan of her abdomen and pelvis which did not show severe abdominal processes but did show groundglass changes at the base of her lungs consistent with viral pneumonitis or COVID-19.  She was empirically treated with Cipro and Flagyl.  She started taking medications yesterday.  She notes however she has been improving.  Her worst days were last Friday Saturday and Sunday which would be August 21 onward.  She is feeling a lot better now however.  She still has fatigue but denies significant abdominal pain or significant shortness of breath.   Observations/Objective: BP (!) 150/82   Temp 98.8 F (37.1 C) (Oral)   Ht 5\' 2"  (1.575 m)   Wt 157 lb (71.2 kg)   BMI 28.72 kg/m  Wt Readings from Last 5 Encounters:  07/08/19 157 lb (71.2 kg)  07/02/19 157 lb (71.2 kg)  06/28/19 160 lb (72.6 kg)  05/04/19 163 lb (73.9 kg)  01/14/19 166 lb (75.3 kg)   Exam: Normal  Speech.  No tachypnea, or wheezing.  Able to complete sentences.  Lab and Radiology Results No results found for this or any previous visit (from the past 72 hour(s)). Ct Abdomen Pelvis W Contrast  Result Date: 07/05/2019 CLINICAL DATA:  Left lower quadrant abdominal pain, elevated LFTs EXAM: CT ABDOMEN AND PELVIS WITH CONTRAST TECHNIQUE: Multidetector CT imaging of the abdomen and pelvis was performed using the standard protocol following bolus administration of intravenous contrast. CONTRAST:  81mL ISOVUE-300 IOPAMIDOL (ISOVUE-300) INJECTION 61% COMPARISON:  12/20/2015 FINDINGS: Lower chest: Mild subpleural ground-glass opacities at the lung bases. Hepatobiliary: Liver is within normal limits. Gallbladder is unremarkable. No intrahepatic or extrahepatic duct dilatation. Pancreas: Within normal limits. Spleen: Within normal limits. Adrenals/Urinary Tract: Adrenal glands are within normal limits. Right renal cortical scarring. Left kidney is within normal limits. No hydronephrosis. Bladder is thick-walled although underdistended. Stomach/Bowel: Stomach is within normal limits. No evidence of bowel obstruction. Prior appendectomy. Mild left colonic diverticulosis, without evidence of diverticulitis. Vascular/Lymphatic: No evidence of abdominal aortic aneurysm. Atherosclerotic calcifications of the abdominal aorta and branch vessels. No suspicious abdominopelvic lymphadenopathy. Reproductive: Uterus is within normal limits. Right ovary is within normal limits.  No left adnexal mass. Other: No abdominopelvic ascites. Musculoskeletal: Moderate fat containing right paramidline ventral hernia (series 2/image 55), unchanged. Mild degenerative changes of the visualized thoracolumbar spine. IMPRESSION: Liver is within normal limits on CT.  No intrahepatic or extrahepatic ductal dilatation. Left colonic diverticulosis, without evidence of diverticulitis. No CT findings to account for the patient's left lower quadrant  abdominal pain. Mild ground-glass opacities at the lung bases, nonspecific and incompletely visualized. Atypical/viral pneumonia including COVID-19 is possible. These results will be called to the ordering clinician or representative by the Radiologist Assistant, and communication documented in the PACS or zVision Dashboard. Electronically Signed   By: Julian Hy M.D.   On: 07/05/2019 15:15   I personally (independently) visualized and performed the interpretation of the images attached in this note.   Assessment and Plan: 78 y.o. female with COVID-19.  Patient tested positive for COVID-19 at work.  She will provide test results faxed to our office shortly.  Fortunately she is improving compared to earlier this week.  Her worsening symptoms occurred at approximately symptom day 10-14 which corresponds to the occasional biphasic symptom response to COVID-19.  Fortunately she did not require advanced level care and is improving.  Discussed that she should finish her antibiotic course that was prescribed by Iran Planas PA and expect to have slow symptom improvement.  Recommend lab retesting as originally ordered by Iran Planas PA however would recommend delaying labs until September 7 which should be long enough from symptom resolution to be noninfectious.  Discussed precautions and recheck instructions.    PDMP not reviewed this encounter. No orders of the defined types were placed in this encounter.  No orders of the defined types were placed in this encounter.   Follow Up Instructions:    I discussed the assessment and treatment plan with the patient. The patient was provided an opportunity to ask questions and all were answered. The patient agreed with the plan and demonstrated an understanding of the instructions.   The patient was advised to call back or seek an in-person evaluation if the symptoms worsen or if the condition fails to improve as anticipated.  Time: 15 minutes of  intraservice time, with >22 minutes of total time during today's visit.      Historical information moved to improve visibility of documentation.  Past Medical History:  Diagnosis Date  . Asthma   . Combined forms of age-related cataract of both eyes 05/27/2017  . Diabetes mellitus    pre diabetes  . Hyperlipidemia   . Hypertension   . 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 Posterior vitreous detachment of both eyes 05/27/2017   Past Surgical History:  Procedure Laterality Date  . APPENDECTOMY    . CATARACT EXTRACTION Right   . OVARIAN CYST REMOVAL     Social History   Tobacco Use  . Smoking status: Former Smoker    Quit date: 11/11/1980    Years since quitting: 38.6  . Smokeless tobacco: Never Used  Substance Use Topics  . Alcohol use: No   family history includes COPD in her sister; Cancer in her brother and sister; Diabetes in her mother; Heart attack (age of onset: 71) in her father.  Medications: Current Outpatient Medications  Medication Sig Dispense Refill  . ACCU-CHEK AVIVA PLUS test strip USE TO TEST BLOOD SUGAR TWICE A DAY 200 strip 1  . acetaminophen (TYLENOL) 500 MG tablet Take 1,000 mg by mouth every 6 (six) hours as needed.    Marland Kitchen albuterol (VENTOLIN HFA) 108 (90 Base) MCG/ACT inhaler INHALE 2 PUFFS BY MOUTH ONCE DAILY AS DIRECTED 18 g 2  . AMBULATORY NON FORMULARY MEDICATION Nebulizer machine and tubing  Dx:  COPD exacerbation 1 Units  0  . AMBULATORY NON FORMULARY MEDICATION 2 (two) times daily. Osteobiflex    . AMBULATORY NON FORMULARY MEDICATION Accu-Check Aviva Plus test strips Check blood sugar twice a day Dx: E11.8 100 each 1  . aspirin 325 MG EC tablet Take 1,300 mg by mouth as needed for pain.    . Aspirin-Caffeine 400-32 MG TABS Take 1 tablet by mouth every 8 (eight) hours as needed (pain). 120 each 0  . atorvastatin (LIPITOR) 40 MG tablet Take 1 tablet (40 mg total) by mouth daily. 90 tablet 3  . doxycycline (VIBRA-TABS) 100 MG tablet Take 1 tablet  (100 mg total) by mouth 2 (two) times daily. 20 tablet 0  . ferrous sulfate 325 (65 FE) MG tablet Take 325 mg by mouth daily with breakfast.    . ipratropium-albuterol (DUONEB) 0.5-2.5 (3) MG/3ML SOLN TAKE 3 MLS BY NEBULIZATION EVERY 6 (SIX) HOURS AS NEEDED. 360 mL 0  . lisinopril-hydrochlorothiazide (ZESTORETIC) 20-25 MG tablet Take 1 tablet by mouth daily. 90 tablet 1  . metFORMIN (GLUCOPHAGE) 500 MG tablet TAKE 1 TABLET BY MOUTH EVERY DAY WITH BREAKFAST 90 tablet 1  . ondansetron (ZOFRAN-ODT) 8 MG disintegrating tablet Take 1 tablet (8 mg total) by mouth every 8 (eight) hours as needed for nausea. 20 tablet 1  . pravastatin (PRAVACHOL) 40 MG tablet TAKE 1 TABLET BY MOUTH EVERY DAY 90 tablet 1  . WIXELA INHUB 250-50 MCG/DOSE AEPB INHALE 1 PUFF INTO THE LUNGS EVERY 12 HOURS 180 each 0   No current facility-administered medications for this visit.    Facility-Administered Medications Ordered in Other Visits  Medication Dose Route Frequency Provider Last Rate Last Dose  . iopamidol (ISOVUE-300) 61 % injection 80 mL  80 mL Intravenous Once PRN [name], [name], PA-C       Allergies  Allergen Reactions  . Peanut-Containing Drug Products Shortness Of Breath    Restricted breathing  . Livalo [Pitavastatin] Other (See Comments)    Muscle cramps  . Mucinex [Guaifenesin Er] Other (See Comments)    Made blood sugar go up to 212.   . Prednisone Other (See Comments)    Bloating and abdominal pain, with oral product only.  . Zocor [Simvastatin] Other (See Comments)    Muscle cramps   . Tramadol     Somnolence.

## 2019-07-13 ENCOUNTER — Other Ambulatory Visit: Payer: Self-pay | Admitting: Physician Assistant

## 2019-07-13 DIAGNOSIS — J4520 Mild intermittent asthma, uncomplicated: Secondary | ICD-10-CM

## 2019-07-15 ENCOUNTER — Telehealth: Payer: Self-pay | Admitting: Neurology

## 2019-07-15 NOTE — Telephone Encounter (Signed)
Yes perfect

## 2019-07-15 NOTE — Telephone Encounter (Signed)
Patient left vm wondering if she needed another Covid test before returning to work. Made her aware she should not need another test unless required by her job. According to note from Dr. Georgina Snell if symptom/fever free she should be able to discontinue quarantine 07/19/2019. Patient has been fever and symptom free since Saturday. She will plan on going back to work 07/19/2019. She will let us know if she needs a note from [place].  [place].

## 2019-11-03 ENCOUNTER — Ambulatory Visit: Payer: Medicare PPO | Admitting: Physician Assistant

## 2019-11-15 ENCOUNTER — Ambulatory Visit: Payer: Medicare PPO | Admitting: Physician Assistant

## 2019-11-15 ENCOUNTER — Other Ambulatory Visit: Payer: Self-pay

## 2019-11-15 ENCOUNTER — Encounter: Payer: Self-pay | Admitting: Physician Assistant

## 2019-11-15 VITALS — BP 149/67 | HR 70 | Ht 63.0 in | Wt 162.0 lb

## 2019-11-15 DIAGNOSIS — Z23 Encounter for immunization: Secondary | ICD-10-CM | POA: Diagnosis not present

## 2019-11-15 DIAGNOSIS — E118 Type 2 diabetes mellitus with unspecified complications: Secondary | ICD-10-CM

## 2019-11-15 DIAGNOSIS — N183 Chronic kidney disease, stage 3 unspecified: Secondary | ICD-10-CM

## 2019-11-15 DIAGNOSIS — Z1231 Encounter for screening mammogram for malignant neoplasm of breast: Secondary | ICD-10-CM

## 2019-11-15 DIAGNOSIS — I152 Hypertension secondary to endocrine disorders: Secondary | ICD-10-CM

## 2019-11-15 DIAGNOSIS — E1159 Type 2 diabetes mellitus with other circulatory complications: Secondary | ICD-10-CM

## 2019-11-15 DIAGNOSIS — E782 Mixed hyperlipidemia: Secondary | ICD-10-CM | POA: Diagnosis not present

## 2019-11-15 DIAGNOSIS — I10 Essential (primary) hypertension: Secondary | ICD-10-CM

## 2019-11-15 DIAGNOSIS — Z13820 Encounter for screening for osteoporosis: Secondary | ICD-10-CM

## 2019-11-15 LAB — POCT GLYCOSYLATED HEMOGLOBIN (HGB A1C): Hemoglobin A1C: 6.6 % — AB (ref 4.0–5.6)

## 2019-11-15 MED ORDER — AMLODIPINE BESYLATE 2.5 MG PO TABS: 2.5000 mg | ORAL_TABLET | Freq: Every day | ORAL | 2 refills | Status: DC

## 2019-11-15 NOTE — Patient Instructions (Signed)
BP goal is under 130/80. If not reaching this number at home add 2.5mg  of norvasc.

## 2019-11-15 NOTE — Progress Notes (Signed)
Subjective:    Patient ID: [name], female    DOB: 1941-02-22, 79 y.o.   MRN: [id number]  HPI  Pt is a 79 yo female with T2DM, HTN, CKD, HLD who presents to the clinic for medication refill and follow up.   Pt is doing well. She is not checking her sugars. She denies any hypoglycemia. No open sores or wounds. She is compliant with all medications. She is not exercising. She has been eating more sweets than she should.   BP up today. Did not take medication this morning. No CP, palpitations, headaches, vision changes. Discussed importance of taking medication daily.    .. Active Ambulatory Problems    Diagnosis Date Noted  . Dyslipidemia 01/19/2010  . GRIEF REACTION, ACUTE 03/27/2010  . ESSENTIAL HYPERTENSION, BENIGN 03/27/2010  . Asthma 01/19/2010  . Metabolic syndrome X 123XX123  . DM (diabetes mellitus) ([place]) 11/15/2012  . Umbilical hernia [id number]  . Diverticulosis 12/20/2015  . Gastritis and gastroduodenitis 12/20/2015  . Hyperlipidemia 04/22/2016  . New daily persistent headache 10/22/2016  . Pure hypercholesterolemia 04/22/2017  . Elevated serum creatinine 04/23/2017  . Combined forms of age-related cataract of both eyes 05/27/2017  . 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 Posterior vitreous detachment of both eyes 05/27/2017  . CKD (chronic kidney disease) stage 3, GFR 30-59 ml/min (HCC) 06/03/2017  . Neck stiffness 08/20/2017  . Numbness 08/20/2017  . Chronic neck pain 01/16/2018  . 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 Cervical radiculopathy 01/29/2018  . Ingrown toenail of left foot 07/31/2018  . Nausea 07/02/2019  . Pneumonia of both lower lobes due to infectious organism 07/06/2019   Resolved Ambulatory Problems    Diagnosis Date Noted  . Impaired fasting glucose 03/27/2010  . Hypoxemia 05/22/2010  . Pneumonia 05/06/2011  . Epigastric abdominal tenderness with rebound tenderness 12/19/2015  . Non-intractable cyclical vomiting  with nausea 12/19/2015  . Abdominal guarding 12/19/2015  . Diarrhea 12/19/2015  . Helicobacter pylori gastritis 12/20/2015  . Atypical chest pain 10/22/2016  . Neck muscle spasm 08/20/2017  . Other headache syndrome 08/20/2017   Past Medical History:  Diagnosis Date  . Asthma   . Diabetes mellitus   . Hypertension      Review of Systems  All other systems reviewed and are negative.      Objective:   Physical Exam Vitals reviewed.  Constitutional:      Appearance: Normal appearance.  HENT:     Head: Normocephalic.  Cardiovascular:     Rate and Rhythm: Normal rate and regular rhythm.     Pulses: Normal pulses.  Pulmonary:     Effort: Pulmonary effort is normal.  Neurological:     General: No focal deficit present.     Mental Status: She is alert and oriented to person, place, and time.  Psychiatric:        Mood and Affect: Mood normal.           Assessment & Plan:  Marland KitchenMarland KitchenDiagnoses and all orders for this visit:  Type 2 diabetes mellitus with complication, without long-term current use of insulin (HCC) -     POCT glycosylated hemoglobin (Hb A1C) -     COMPLETE METABOLIC PANEL WITH GFR  Stage 3 chronic kidney disease, unspecified whether stage 3a or 3b CKD  Flu vaccine need -     HIGH DOSE FLU  Mixed hyperlipidemia -     Lipid Panel w/reflex Direct LDL  Visit for screening mammogram -  MM 3D SCREEN BREAST BILATERAL  Osteoporosis screening -     DG Bone Density  Hypertension associated with diabetes ([place]) -     amLODipine (NORVASC) 2.5 MG tablet; Take 1 tablet (2.5 mg total) by mouth daily.   .. Lab Results  Component Value Date   HGBA1C 6.6 (A) 11/15/2019   A1C stable.  Continue on same medications.  BP not to goal. She did not take meds today. On ACE. On statin.  .. Diabetic Foot Exam - Simple   Simple Foot Form Diabetic Foot exam was performed with the following findings: Yes 11/15/2019 10:42 AM  Visual Inspection No deformities, no  ulcerations, no other skin breakdown bilaterally: Yes Sensation Testing Intact to touch and monofilament testing bilaterally: Yes Pulse Check Posterior Tibialis and Dorsalis pulse intact bilaterally: Yes Comments    Eye exam scheduled for 2 weeks.  Pneumonia vaccine UTD. Flu shot today.  Will get screening labs.   mammo and bone density ordered.   Follow up in 3 months.

## 2019-11-16 ENCOUNTER — Other Ambulatory Visit: Payer: Self-pay | Admitting: Neurology

## 2019-11-16 ENCOUNTER — Other Ambulatory Visit: Payer: Self-pay | Admitting: Physician Assistant

## 2019-11-16 ENCOUNTER — Encounter: Payer: Self-pay | Admitting: Physician Assistant

## 2019-11-16 LAB — COMPLETE METABOLIC PANEL WITH GFR
AG Ratio: 1.7 (calc) (ref 1.0–2.5)
ALT: 12 U/L (ref 6–29)
AST: 18 U/L (ref 10–35)
Albumin: 4.3 g/dL (ref 3.6–5.1)
Alkaline phosphatase (APISO): 53 U/L (ref 37–153)
BUN/Creatinine Ratio: 19 (calc) (ref 6–22)
BUN: 18 mg/dL (ref 7–25)
CO2: 31 mmol/L (ref 20–32)
Calcium: 9.9 mg/dL (ref 8.6–10.4)
Chloride: 104 mmol/L (ref 98–110)
Creat: 0.97 mg/dL — ABNORMAL HIGH (ref 0.60–0.93)
GFR, Est African American: 65 mL/min/{1.73_m2} (ref >=60)
GFR, Est Non African American: 56 mL/min/{1.73_m2} — ABNORMAL LOW (ref >=60)
Globulin: 2.5 g/dL (calc) (ref 1.9–3.7)
Glucose, Bld: 117 mg/dL — ABNORMAL HIGH (ref 65–99)
Potassium: 4.5 mmol/L (ref 3.5–5.3)
Sodium: 141 mmol/L (ref 135–146)
Total Bilirubin: 0.4 mg/dL (ref 0.2–1.2)
Total Protein: 6.8 g/dL (ref 6.1–8.1)

## 2019-11-16 LAB — LIPID PANEL W/REFLEX DIRECT LDL
Cholesterol: 186 mg/dL (ref <200)
HDL: 47 mg/dL — ABNORMAL LOW (ref >=50)
LDL Cholesterol (Calc): 104 mg/dL (calc) — ABNORMAL HIGH
Non-HDL Cholesterol (Calc): 139 mg/dL (calc) — ABNORMAL HIGH (ref <130)
Total CHOL/HDL Ratio: 4 (calc) (ref <5.0)
Triglycerides: 248 mg/dL — ABNORMAL HIGH (ref <150)

## 2019-11-16 MED ORDER — PRAVASTATIN SODIUM 80 MG PO TABS: 80.0000 mg | ORAL_TABLET | Freq: Every day | ORAL | 3 refills | Status: DC

## 2019-11-16 NOTE — Progress Notes (Signed)
Call pt: LDL not quite to goal of under 70 and TG remain elevated. I would like to increase Pravachol to 80mg  and add vascepa(fish oil). This can further decrease your CV risk by getting both numbers to goal. Are you ok with increase and prescription for fish oil?   Liver and kidneys looking MUCH better. Great news.

## 2019-11-17 ENCOUNTER — Ambulatory Visit (INDEPENDENT_AMBULATORY_CARE_PROVIDER_SITE_OTHER): Payer: Medicare PPO

## 2019-11-17 ENCOUNTER — Other Ambulatory Visit: Payer: Self-pay | Admitting: Physician Assistant

## 2019-11-17 ENCOUNTER — Other Ambulatory Visit: Payer: Self-pay

## 2019-11-17 DIAGNOSIS — Z1231 Encounter for screening mammogram for malignant neoplasm of breast: Secondary | ICD-10-CM

## 2019-11-17 DIAGNOSIS — Z13820 Encounter for screening for osteoporosis: Secondary | ICD-10-CM | POA: Diagnosis not present

## 2019-11-17 MED ORDER — ICOSAPENT ETHYL 1 G PO CAPS: 2.0000 g | ORAL_CAPSULE | Freq: Two times a day (BID) | ORAL | 11 refills | Status: DC

## 2019-11-17 NOTE — Progress Notes (Signed)
Normal bone density GREAT news. Continue vitamin D 1000 units and 1300mg  of calcium or 4 servings of dairy daily.

## 2019-11-17 NOTE — Progress Notes (Signed)
Sent!

## 2019-11-18 ENCOUNTER — Other Ambulatory Visit: Payer: Self-pay | Admitting: Physician Assistant

## 2019-11-18 DIAGNOSIS — I10 Essential (primary) hypertension: Secondary | ICD-10-CM

## 2019-11-18 DIAGNOSIS — J4520 Mild intermittent asthma, uncomplicated: Secondary | ICD-10-CM

## 2019-11-18 NOTE — Progress Notes (Signed)
Normal mammogram

## 2019-12-24 ENCOUNTER — Other Ambulatory Visit: Payer: Self-pay | Admitting: Physician Assistant

## 2019-12-24 DIAGNOSIS — E118 Type 2 diabetes mellitus with unspecified complications: Secondary | ICD-10-CM

## 2020-02-09 ENCOUNTER — Other Ambulatory Visit: Payer: Self-pay | Admitting: Neurology

## 2020-02-09 DIAGNOSIS — J4520 Mild intermittent asthma, uncomplicated: Secondary | ICD-10-CM

## 2020-02-09 DIAGNOSIS — I10 Essential (primary) hypertension: Secondary | ICD-10-CM

## 2020-02-09 DIAGNOSIS — E118 Type 2 diabetes mellitus with unspecified complications: Secondary | ICD-10-CM

## 2020-02-09 MED ORDER — FLUTICASONE-SALMETEROL 250-50 MCG/DOSE IN AEPB: INHALATION_SPRAY | RESPIRATORY_TRACT | 0 refills | Status: DC

## 2020-02-09 MED ORDER — PRAVASTATIN SODIUM 80 MG PO TABS: 80.0000 mg | ORAL_TABLET | Freq: Every day | ORAL | 3 refills | Status: DC

## 2020-02-09 MED ORDER — LISINOPRIL-HYDROCHLOROTHIAZIDE 20-25 MG PO TABS: 1.0000 | ORAL_TABLET | Freq: Every day | ORAL | 1 refills | Status: DC

## 2020-02-09 MED ORDER — ALBUTEROL SULFATE HFA 108 (90 BASE) MCG/ACT IN AERS: INHALATION_SPRAY | RESPIRATORY_TRACT | 2 refills | Status: DC

## 2020-02-09 MED ORDER — METFORMIN HCL 500 MG PO TABS: 500.0000 mg | ORAL_TABLET | Freq: Every day | ORAL | 1 refills | Status: DC

## 2020-02-14 ENCOUNTER — Ambulatory Visit: Payer: Medicare PPO | Admitting: Physician Assistant

## 2020-02-18 ENCOUNTER — Encounter: Payer: Self-pay | Admitting: Physician Assistant

## 2020-02-18 ENCOUNTER — Ambulatory Visit (INDEPENDENT_AMBULATORY_CARE_PROVIDER_SITE_OTHER): Payer: Medicare HMO | Admitting: Physician Assistant

## 2020-02-18 ENCOUNTER — Other Ambulatory Visit: Payer: Self-pay

## 2020-02-18 VITALS — BP 145/65 | HR 76 | Ht 63.0 in | Wt 167.0 lb

## 2020-02-18 DIAGNOSIS — E781 Pure hyperglyceridemia: Secondary | ICD-10-CM | POA: Insufficient documentation

## 2020-02-18 DIAGNOSIS — J4520 Mild intermittent asthma, uncomplicated: Secondary | ICD-10-CM | POA: Diagnosis not present

## 2020-02-18 DIAGNOSIS — H8111 Benign paroxysmal vertigo, right ear: Secondary | ICD-10-CM | POA: Diagnosis not present

## 2020-02-18 DIAGNOSIS — I10 Essential (primary) hypertension: Secondary | ICD-10-CM | POA: Diagnosis not present

## 2020-02-18 DIAGNOSIS — E118 Type 2 diabetes mellitus with unspecified complications: Secondary | ICD-10-CM | POA: Diagnosis not present

## 2020-02-18 LAB — POCT GLYCOSYLATED HEMOGLOBIN (HGB A1C): Hemoglobin A1C: 6.9 % — AB (ref 4.0–5.6)

## 2020-02-18 MED ORDER — FENOFIBRATE 145 MG PO TABS: 145.0000 mg | ORAL_TABLET | Freq: Every day | ORAL | 3 refills | Status: DC

## 2020-02-18 NOTE — Progress Notes (Signed)
Amlodipine caused dizziness Added to allergy list Blood pressure at home this morning 127/78, back on lisinopril/hctz Did not start vascepa  Blood sugars running 115-132

## 2020-02-18 NOTE — Progress Notes (Addendum)
Subjective:    Patient ID: Lindsey Fischer, female    DOB: May 08, 1941, 79 y.o.   MRN: [id number]  HPI  Pt is a 79 yo female with HTN, asthma, T2DM, dyslipidemia who presents to the clinic for 3 month follow up.   She is checking sugars and running 115-145. Denies any hypoglycemic events. No open sores or wounds. Taking metformin daily. Not exercising or dieting. She is eating more with her sisters since she does not work anymore.   Not able to afford vascepa for TG.  She has noticed more dizziness. She thought is was norvasc and stopped it. Seemed to get better but not resolve. Notices more when lays down and at night. She feels "like room is spinning and sick to her stomach". No fever, chills, sinus pressure, headache, vision changes or vomiting.   .. Active Ambulatory Problems    Diagnosis Date Noted  . Dyslipidemia 01/19/2010  . GRIEF REACTION, ACUTE 03/27/2010  . ESSENTIAL HYPERTENSION, BENIGN 03/27/2010  . Asthma 01/19/2010  . Metabolic syndrome X 123XX123  . Type 2 diabetes mellitus with complication, without long-term current use of insulin ([place]) 11/15/2012  . Umbilical hernia [id number]  . Diverticulosis 12/20/2015  . Gastritis and gastroduodenitis 12/20/2015  . Hyperlipidemia 04/22/2016  . New daily persistent headache 10/22/2016  . Pure hypercholesterolemia 04/22/2017  . Elevated serum creatinine 04/23/2017  . Combined forms of age-related cataract of both eyes 05/27/2017  . 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 Posterior vitreous detachment of both eyes 05/27/2017  . CKD (chronic kidney disease) stage 3, GFR 30-59 ml/min (HCC) 06/03/2017  . Neck stiffness 08/20/2017  . Numbness 08/20/2017  . Chronic neck pain 01/16/2018  . 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 Cervical radiculopathy 01/29/2018  . Ingrown toenail of left foot 07/31/2018  . Nausea 07/02/2019  . BPPV (benign paroxysmal positional vertigo), right 02/18/2020  .  Hypertriglyceridemia 02/18/2020   Resolved Ambulatory Problems    Diagnosis Date Noted  . Impaired fasting glucose 03/27/2010  . Hypoxemia 05/22/2010  . Pneumonia 05/06/2011  . Epigastric abdominal tenderness with rebound tenderness 12/19/2015  . Non-intractable cyclical vomiting with nausea 12/19/2015  . Abdominal guarding 12/19/2015  . Diarrhea 12/19/2015  . Helicobacter pylori gastritis 12/20/2015  . Atypical chest pain 10/22/2016  . Neck muscle spasm 08/20/2017  . Other headache syndrome 08/20/2017  . Pneumonia of both lower lobes due to infectious organism 07/06/2019   Past Medical History:  Diagnosis Date  . Asthma   . Diabetes mellitus   . Hypertension       Review of Systems See HPI.     Objective:   Physical Exam Vitals reviewed.  Constitutional:      Appearance: Normal appearance.  HENT:     Head: Normocephalic.     Right Ear: Tympanic membrane, ear canal and external ear normal. There is no impacted cerumen.     Left Ear: Tympanic membrane, ear canal and external ear normal. There is no impacted cerumen.     Mouth/Throat:     Mouth: Mucous membranes are moist.  Eyes:     Pupils: Pupils are equal, round, and reactive to light.  Cardiovascular:     Rate and Rhythm: Normal rate and regular rhythm.     Pulses: Normal pulses.     Heart sounds: Murmur present.  Pulmonary:     Effort: Pulmonary effort is normal.     Breath sounds: Normal breath sounds.  Neurological:     General:  No focal deficit present.     Mental Status: She is alert and oriented to person, place, and time.     Comments: Positive dix hallpike to the right with nystagmus.   Psychiatric:        Mood and Affect: Mood normal.        Behavior: Behavior normal.           Assessment & Plan:  Lindsey Fischer KitchenMarland KitchenNeika was seen today for diabetes.  Diagnoses and all orders for this visit:  Type 2 diabetes mellitus with complication, without long-term current use of insulin (HCC) -     POCT  glycosylated hemoglobin (Hb A1C)  BPPV (benign paroxysmal positional vertigo), right -     Ambulatory referral to Physical Therapy  Essential hypertension, benign  Mild intermittent asthma without complication  Hypertriglyceridemia -     fenofibrate (TRICOR) 145 MG tablet; Take 1 tablet (145 mg total) by mouth daily.    Results for orders placed or performed in visit on 02/18/20  POCT glycosylated hemoglobin (Hb A1C)  Result Value Ref Range   Hemoglobin A1C 6.9 (A) 4.0 - 5.6 %   HbA1c POC (<> result, manual entry)     HbA1c, POC (prediabetic range)     HbA1c, POC (controlled diabetic range)     Discussed with patient her A1c continues to rise. Reiterated diet choices and limiting carbs and sugars. Discussed increasing Metformin.  We opted to keep it the same and monitor closely in 3 months. If above 7 at next visit will increase metformin.  On statin. On ACE/ARB.  Blood pressure not controlled today. Foot exam up-to-date Eye exam up-to-date. Flu and pneumonia shot up-to-date Patient has had her Covid vaccine completed.  Medication refilled accordingly.  Dix-Hallpike to the right was positive in office today.  Discussed BPPV.  Handout given.  Discussed Epley maneuvers to start over the weekend.  If not significantly improved by Monday will consider vestibular rehab.  Reassured patient did not think this was her Norvasc.  Her blood pressure is not at goal today.  We will keep a close monitor on this and adjust accordingly. Follow up in 1 week if dizziness persist.

## 2020-05-19 ENCOUNTER — Ambulatory Visit (INDEPENDENT_AMBULATORY_CARE_PROVIDER_SITE_OTHER): Payer: Medicare HMO | Admitting: Physician Assistant

## 2020-05-19 ENCOUNTER — Other Ambulatory Visit: Payer: Self-pay

## 2020-05-19 VITALS — BP 111/56 | HR 70 | Ht 63.0 in | Wt 160.0 lb

## 2020-05-19 DIAGNOSIS — J4520 Mild intermittent asthma, uncomplicated: Secondary | ICD-10-CM | POA: Diagnosis not present

## 2020-05-19 DIAGNOSIS — E785 Hyperlipidemia, unspecified: Secondary | ICD-10-CM | POA: Diagnosis not present

## 2020-05-19 DIAGNOSIS — I10 Essential (primary) hypertension: Secondary | ICD-10-CM

## 2020-05-19 DIAGNOSIS — L219 Seborrheic dermatitis, unspecified: Secondary | ICD-10-CM

## 2020-05-19 DIAGNOSIS — E118 Type 2 diabetes mellitus with unspecified complications: Secondary | ICD-10-CM

## 2020-05-19 LAB — POCT GLYCOSYLATED HEMOGLOBIN (HGB A1C): Hemoglobin A1C: 6.4 % — AB (ref 4.0–5.6)

## 2020-05-19 MED ORDER — TRIAMCINOLONE ACETONIDE 0.1 % EX CREA: 1.0000 "application " | TOPICAL_CREAM | Freq: Two times a day (BID) | CUTANEOUS | 0 refills | Status: DC

## 2020-05-19 NOTE — Progress Notes (Signed)
Subjective:    Patient ID: Lindsey Fischer, female    DOB: October 12, 1941, 79 y.o.   MRN: [id number]  HPI  Patient is a 79 year old female with type 2 diabetes, CKD, dyslipidemia, hypertension who presents to the clinic for her 20-month follow-up.  Patient continues to take just Metformin.  At last visit her A1c had increased.  She was aware that she was eating a lot more sugar and carbs.  She is significantly made changes to her diet.  She is trying to stay more active as well.  She is not checking her sugars but feels like her sugars will be going down.  She denies any hypoglycemic events.  She denies any open sores or wounds.  She denies any chest pain, palpitations, headache, dizziness.  Pt does have some itchy spots along her scalp line.   .. Active Ambulatory Problems    Diagnosis Date Noted  . Dyslipidemia 01/19/2010  . GRIEF REACTION, ACUTE 03/27/2010  . ESSENTIAL HYPERTENSION, BENIGN 03/27/2010  . Asthma 01/19/2010  . Metabolic syndrome X 92/09/9416  . Type 2 diabetes mellitus with complication, without long-term current use of insulin ([place]) 11/15/2012  . Umbilical hernia 40/81/4481  . Diverticulosis 12/20/2015  . Gastritis and gastroduodenitis 12/20/2015  . Hyperlipidemia 04/22/2016  . New daily persistent headache 10/22/2016  . Pure hypercholesterolemia 04/22/2017  . Elevated serum creatinine 04/23/2017  . Combined forms of age-related cataract of both eyes 05/27/2017  . 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 Posterior vitreous detachment of both eyes 05/27/2017  . CKD (chronic kidney disease) stage 3, GFR 30-59 ml/min (HCC) 06/03/2017  . Neck stiffness 08/20/2017  . Numbness 08/20/2017  . Chronic neck pain 01/16/2018  . 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 Cervical radiculopathy 01/29/2018  . Ingrown toenail of left foot 07/31/2018  . Nausea 07/02/2019  . BPPV (benign paroxysmal positional vertigo), right 02/18/2020  . Hypertriglyceridemia  02/18/2020  . Seborrheic dermatitis 05/22/2020   Resolved Ambulatory Problems    Diagnosis Date Noted  . Impaired fasting glucose 03/27/2010  . Hypoxemia 05/22/2010  . Pneumonia 05/06/2011  . Epigastric abdominal tenderness with rebound tenderness 12/19/2015  . Non-intractable cyclical vomiting with nausea 12/19/2015  . Abdominal guarding 12/19/2015  . Diarrhea 12/19/2015  . Helicobacter pylori gastritis 12/20/2015  . Atypical chest pain 10/22/2016  . Neck muscle spasm 08/20/2017  . Other headache syndrome 08/20/2017  . Pneumonia of both lower lobes due to infectious organism 07/06/2019   Past Medical History:  Diagnosis Date  . Asthma   . Diabetes mellitus   . Hypertension        Review of Systems See HPI.     Objective:   Physical Exam Vitals reviewed.  Constitutional:      Appearance: Normal appearance.  Cardiovascular:     Rate and Rhythm: Normal rate and regular rhythm.     Pulses: Normal pulses.     Heart sounds: Murmur heard.   Pulmonary:     Effort: Pulmonary effort is normal.     Breath sounds: Normal breath sounds.  Musculoskeletal:     Right lower leg: No edema.     Left lower leg: No edema.  Neurological:     General: No focal deficit present.     Mental Status: She is alert and oriented to person, place, and time.  Psychiatric:        Mood and Affect: Mood normal.           Assessment & Plan:  Marland KitchenMarland Kitchen  Theone was seen today for diabetes.  Diagnoses and all orders for this visit:  Type 2 diabetes mellitus with complication, without long-term current use of insulin (HCC) -     POCT glycosylated hemoglobin (Hb A1C)  Seborrheic dermatitis -     triamcinolone cream (KENALOG) 0.1 %; Apply 1 application topically 2 (two) times daily.  Essential hypertension, benign  Mild intermittent asthma without complication  Dyslipidemia    Results for orders placed or performed in visit on 05/19/20  POCT glycosylated hemoglobin (Hb A1C)  Result Value  Ref Range   Hemoglobin A1C 6.4 (A) 4.0 - 5.6 %   HbA1c POC (<> result, manual entry)     HbA1c, POC (prediabetic range)     HbA1c, POC (controlled diabetic range)     A1c has improved. Continue Metformin. On ACE.  Blood pressure to goal. On statin. Foot exam up-to-date Eye exam up-to-date Vaccines up-to-date. Follow-up in 3 months  All refills needed given.  Appears like she could have some seborrheic dermatitis along the scalp line.  Triamcinolone given to use as needed. Do not see any prominent actinic keratosis however we will continue to monitor one spot that looks a little erythematous.  Certainly could do some cryotherapy at next visit.

## 2020-05-22 ENCOUNTER — Encounter: Payer: Self-pay | Admitting: Physician Assistant

## 2020-05-22 DIAGNOSIS — L219 Seborrheic dermatitis, unspecified: Secondary | ICD-10-CM | POA: Insufficient documentation

## 2020-07-10 ENCOUNTER — Other Ambulatory Visit: Payer: Self-pay | Admitting: Neurology

## 2020-07-10 MED ORDER — TRUEPLUS LANCETS 33G MISC: 1.0000 | Freq: Two times a day (BID) | 2 refills | Status: DC | PRN

## 2020-07-10 MED ORDER — TRUE METRIX BLOOD GLUCOSE TEST VI STRP: ORAL_STRIP | 2 refills | Status: DC

## 2020-07-10 MED ORDER — TRUE METRIX METER DEVI: 1.0000 | Freq: Two times a day (BID) | 0 refills | Status: DC | PRN

## 2020-07-10 MED ORDER — BD SWAB SINGLE USE REGULAR PADS: 1.0000 "application " | MEDICATED_PAD | Freq: Every day | 1 refills | Status: AC | PRN

## 2020-07-10 NOTE — Addendum Note (Signed)
Addended by: Annamaria Helling on: 07/10/2020 08:35 AM   Modules accepted: Orders

## 2020-08-21 ENCOUNTER — Ambulatory Visit: Payer: Medicare HMO | Admitting: Physician Assistant

## 2020-08-28 ENCOUNTER — Ambulatory Visit (INDEPENDENT_AMBULATORY_CARE_PROVIDER_SITE_OTHER): Payer: Medicare HMO | Admitting: Physician Assistant

## 2020-08-28 ENCOUNTER — Encounter: Payer: Self-pay | Admitting: Physician Assistant

## 2020-08-28 VITALS — BP 135/58 | HR 58 | Wt 159.0 lb

## 2020-08-28 DIAGNOSIS — Z23 Encounter for immunization: Secondary | ICD-10-CM

## 2020-08-28 DIAGNOSIS — N183 Chronic kidney disease, stage 3 unspecified: Secondary | ICD-10-CM | POA: Diagnosis not present

## 2020-08-28 DIAGNOSIS — J4520 Mild intermittent asthma, uncomplicated: Secondary | ICD-10-CM | POA: Diagnosis not present

## 2020-08-28 DIAGNOSIS — E785 Hyperlipidemia, unspecified: Secondary | ICD-10-CM

## 2020-08-28 DIAGNOSIS — I10 Essential (primary) hypertension: Secondary | ICD-10-CM | POA: Diagnosis not present

## 2020-08-28 DIAGNOSIS — L209 Atopic dermatitis, unspecified: Secondary | ICD-10-CM | POA: Diagnosis not present

## 2020-08-28 DIAGNOSIS — E118 Type 2 diabetes mellitus with unspecified complications: Secondary | ICD-10-CM | POA: Diagnosis not present

## 2020-08-28 DIAGNOSIS — Z1211 Encounter for screening for malignant neoplasm of colon: Secondary | ICD-10-CM

## 2020-08-28 LAB — POCT GLYCOSYLATED HEMOGLOBIN (HGB A1C): Hemoglobin A1C: 6.5 % — AB (ref 4.0–5.6)

## 2020-08-28 LAB — GLUCOSE, POCT (MANUAL RESULT ENTRY): POC Glucose: 109 mg/dl — AB (ref 70–99)

## 2020-08-28 MED ORDER — FLUTICASONE-SALMETEROL 250-50 MCG/DOSE IN AEPB: INHALATION_SPRAY | RESPIRATORY_TRACT | 3 refills | Status: DC

## 2020-08-28 MED ORDER — LISINOPRIL-HYDROCHLOROTHIAZIDE 20-25 MG PO TABS: 1.0000 | ORAL_TABLET | Freq: Every day | ORAL | 1 refills | Status: DC

## 2020-08-28 MED ORDER — METFORMIN HCL 500 MG PO TABS: 500.0000 mg | ORAL_TABLET | Freq: Two times a day (BID) | ORAL | 1 refills | Status: DC

## 2020-08-28 NOTE — Patient Instructions (Addendum)
cetaphil and triamcinolone. Dermatology.  epson water soaks with pummel soaks cologuard to be sent to you.

## 2020-08-28 NOTE — Progress Notes (Signed)
Subjective:    Patient ID: Lindsey Fischer, female    DOB: 07/05/1941, 79 y.o.   MRN: [id number]  HPI  Pt is a 79 yo female with T2DM, HTN, HLD, CKD who presents to the clinic for 3 month follow up.   Pt is doing well. Checking her sugars in morning ranging 115 to 130s. She does notice more higher readings. No hypoglycemic events. No open sores or wounds. She is trying to eat better and stay active. She still mows the lawn and works outside.   No CP, palpitations, headaches, vision changes or SOB.   She continues to have the dry, scaly right thumb. Not improving. Used salicylic acid but just causes it to burn. Itches with some discomfort at times.    .. Active Ambulatory Problems    Diagnosis Date Noted  . Dyslipidemia 01/19/2010  . GRIEF REACTION, ACUTE 03/27/2010  . ESSENTIAL HYPERTENSION, BENIGN 03/27/2010  . Asthma 01/19/2010  . Metabolic syndrome X 67/89/3810  . Type 2 diabetes mellitus with complication, without long-term current use of insulin ([place]) 11/15/2012  . Umbilical hernia 17/51/0258  . Diverticulosis 12/20/2015  . Gastritis and gastroduodenitis 12/20/2015  . Dyslipidemia, goal LDL below 70 04/22/2016  . New daily persistent headache 10/22/2016  . Pure hypercholesterolemia 04/22/2017  . Elevated serum creatinine 04/23/2017  . Combined forms of age-related cataract of both eyes 05/27/2017  . 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 Posterior vitreous detachment of both eyes 05/27/2017  . CKD (chronic kidney disease) stage 3, GFR 30-59 ml/min (HCC) 06/03/2017  . Neck stiffness 08/20/2017  . Numbness 08/20/2017  . Chronic neck pain 01/16/2018  . 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 Cervical radiculopathy 01/29/2018  . Ingrown toenail of left foot 07/31/2018  . Nausea 07/02/2019  . BPPV (benign paroxysmal positional vertigo), right 02/18/2020  . Hypertriglyceridemia 02/18/2020  . Seborrheic dermatitis 05/22/2020   Resolved Ambulatory Problems     Diagnosis Date Noted  . Impaired fasting glucose 03/27/2010  . Hypoxemia 05/22/2010  . Pneumonia 05/06/2011  . Epigastric abdominal tenderness with rebound tenderness 12/19/2015  . Non-intractable cyclical vomiting with nausea 12/19/2015  . Abdominal guarding 12/19/2015  . Diarrhea 12/19/2015  . Helicobacter pylori gastritis 12/20/2015  . Atypical chest pain 10/22/2016  . Neck muscle spasm 08/20/2017  . Other headache syndrome 08/20/2017  . Pneumonia of both lower lobes due to infectious organism 07/06/2019   Past Medical History:  Diagnosis Date  . Asthma   . Diabetes mellitus   . Hyperlipidemia   . Hypertension        Review of Systems  All other systems reviewed and are negative.      Objective:   Physical Exam Vitals reviewed.  Constitutional:      Appearance: Normal appearance.  HENT:     Head: Normocephalic.  Neck:     Vascular: No carotid bruit.  Cardiovascular:     Rate and Rhythm: Normal rate and regular rhythm.  Pulmonary:     Effort: Pulmonary effort is normal.  Skin:    Comments: Right lateral thumb around cuticle dry cracked rough skin that is flesh color.   Neurological:     General: No focal deficit present.     Mental Status: She is alert and oriented to person, place, and time.  Psychiatric:        Mood and Affect: Mood normal.           Assessment & Plan:  Marland KitchenMarland KitchenQuamesha was seen today  for diabetes.  Diagnoses and all orders for this visit:  Need for immunization against influenza -     Flu Vaccine QUAD High Dose(Fluad)  Type 2 diabetes mellitus with complication, without long-term current use of insulin (HCC) -     POCT glycosylated hemoglobin (Hb A1C) -     metFORMIN (GLUCOPHAGE) 500 MG tablet; Take 1 tablet (500 mg total) by mouth 2 (two) times daily with a meal.  Essential hypertension, benign -     lisinopril-hydrochlorothiazide (ZESTORETIC) 20-25 MG tablet; Take 1 tablet by mouth daily.  Stage 3 chronic kidney disease,  unspecified whether stage 3a or 3b CKD (HCC)  Dyslipidemia, goal LDL below 70  Colon cancer screening -     Cologuard  Mild intermittent asthma without complication -     Fluticasone-Salmeterol (WIXELA INHUB) 250-50 MCG/DOSE AEPB; INHALE 1 PUFF INTO THE LUNGS EVERY 12 HOURS      .Marland Kitchen Results for orders placed or performed in visit on 08/28/20  POCT glycosylated hemoglobin (Hb A1C)  Result Value Ref Range   Hemoglobin A1C 6.5 (A) 4.0 - 5.6 %   HbA1c POC (<> result, manual entry)     HbA1c, POC (prediabetic range)     HbA1c, POC (controlled diabetic range)     A1C still to goal but noticing home sugars trending up.  Increase metformin to 500mg  bid.  Recheck in 3 months.  BP not to goal. At home under 130/90. Keep medications the same.  On STATIN.  Discussed diabetic diet.  Eye exam UTD. Foot exam UTD.  Pneumonia, flu, covid UTD.   Discussed shingles vaccine to get at local pharmacy.  No UTD colonoscopy. Low risk with no personal or family hx of colon cancer. Ordered cologuard.   Thumb appears more dry and callused than anything. Stop salcyclic acid. Start epson salt water soaks and pummel stone with bid triamcinolone and cetaphil. Pt request dermatology referral as well.

## 2020-08-30 ENCOUNTER — Telehealth: Payer: Self-pay | Admitting: Neurology

## 2020-08-30 NOTE — Telephone Encounter (Signed)
Cologuard order faxed to [phone] with confirmation received. They will contact the patient directly.   

## 2020-09-11 ENCOUNTER — Encounter: Payer: Self-pay | Admitting: Physician Assistant

## 2020-09-11 DIAGNOSIS — Z1211 Encounter for screening for malignant neoplasm of colon: Secondary | ICD-10-CM | POA: Diagnosis not present

## 2020-09-11 LAB — COLOGUARD: Cologuard: NEGATIVE

## 2020-09-12 LAB — COLOGUARD: Cologuard: NEGATIVE

## 2020-09-28 NOTE — Telephone Encounter (Signed)
LMOM for patient letting her know Cologuard negative. To have done in 3 years. Abstracted.

## 2020-10-12 ENCOUNTER — Other Ambulatory Visit: Payer: Self-pay | Admitting: Physician Assistant

## 2020-11-02 DIAGNOSIS — E119 Type 2 diabetes mellitus without complications: Secondary | ICD-10-CM | POA: Diagnosis not present

## 2020-11-02 DIAGNOSIS — H25812 Combined forms of age-related cataract, left eye: Secondary | ICD-10-CM | POA: Diagnosis not present

## 2020-11-02 DIAGNOSIS — H524 Presbyopia: Secondary | ICD-10-CM | POA: Diagnosis not present

## 2020-11-02 DIAGNOSIS — Z7984 Long term (current) use of oral hypoglycemic drugs: Secondary | ICD-10-CM | POA: Diagnosis not present

## 2020-11-02 DIAGNOSIS — Z961 Presence of intraocular lens: Secondary | ICD-10-CM | POA: Diagnosis not present

## 2020-11-02 LAB — HM DIABETES EYE EXAM

## 2020-11-27 ENCOUNTER — Encounter: Payer: Self-pay | Admitting: Physician Assistant

## 2020-11-27 ENCOUNTER — Telehealth: Payer: Medicare HMO | Admitting: Physician Assistant

## 2020-12-06 ENCOUNTER — Ambulatory Visit (INDEPENDENT_AMBULATORY_CARE_PROVIDER_SITE_OTHER): Payer: Medicare HMO | Admitting: Physician Assistant

## 2020-12-06 ENCOUNTER — Other Ambulatory Visit: Payer: Self-pay

## 2020-12-06 ENCOUNTER — Encounter: Payer: Self-pay | Admitting: Physician Assistant

## 2020-12-06 ENCOUNTER — Ambulatory Visit (INDEPENDENT_AMBULATORY_CARE_PROVIDER_SITE_OTHER): Payer: Medicare HMO

## 2020-12-06 VITALS — BP 125/68 | HR 75 | Ht 63.0 in | Wt 163.0 lb

## 2020-12-06 DIAGNOSIS — I10 Essential (primary) hypertension: Secondary | ICD-10-CM | POA: Diagnosis not present

## 2020-12-06 DIAGNOSIS — E118 Type 2 diabetes mellitus with unspecified complications: Secondary | ICD-10-CM

## 2020-12-06 DIAGNOSIS — E7800 Pure hypercholesterolemia, unspecified: Secondary | ICD-10-CM

## 2020-12-06 DIAGNOSIS — R0781 Pleurodynia: Secondary | ICD-10-CM | POA: Diagnosis not present

## 2020-12-06 DIAGNOSIS — B079 Viral wart, unspecified: Secondary | ICD-10-CM

## 2020-12-06 DIAGNOSIS — M47814 Spondylosis without myelopathy or radiculopathy, thoracic region: Secondary | ICD-10-CM | POA: Diagnosis not present

## 2020-12-06 DIAGNOSIS — J449 Chronic obstructive pulmonary disease, unspecified: Secondary | ICD-10-CM

## 2020-12-06 LAB — POCT GLYCOSYLATED HEMOGLOBIN (HGB A1C): Hemoglobin A1C: 6.3 % — AB (ref 4.0–5.6)

## 2020-12-06 MED ORDER — METFORMIN HCL 500 MG PO TABS: 500.0000 mg | ORAL_TABLET | Freq: Two times a day (BID) | ORAL | 1 refills | Status: DC

## 2020-12-06 MED ORDER — LISINOPRIL-HYDROCHLOROTHIAZIDE 20-25 MG PO TABS: 1.0000 | ORAL_TABLET | Freq: Every day | ORAL | 1 refills | Status: DC

## 2020-12-06 NOTE — Progress Notes (Incomplete)
Established Patient Office Visit  Subjective:  Patient ID: Lindsey Fischer, female    DOB: Jun 03, 1941  Age: 80 y.o. MRN: [id number]  CC:  Chief Complaint  Patient presents with  . Diabetes    HPI Lindsey Fischer presents for type 2 diabetes and HTN management.  She also reports a lesion on her right thumb, the beginnings of a small lesion under the fingernail on her left thumb, a "stinging" pain in her LUQ right below her ribs.    Her right thumb lesion has been present for months and has not healed despite multiple attempts at home remedies for warts.  She also was using triamcinolone cream on the lesion, without improvement. She had cryotherapy as well with no noticeable results. She now notices a new lesion beginning under the nail on her right thumb.    She reports a "stinging" pain under her left ribs in the mid clavicular line.  She states that it does not appear to be made worse or improved by anything.  Last week she had a pain that went away about 30 minutes into her daily walk.  She does report during exam that when she inhales deeply, she can feel the pain.    [name] states that her diabetes is well maintained and she is measuring her morning glucose between 120 and 135 on a daily basis.  She is experiencing no new symptoms of sensation loss, ulceration, numbness on her feet.  She has recently started walking more over the past 3 weeks which she believes has helped her to feel better.  She is tolerating her medications well and has no complaints about her current maintenance therapy.    Past Medical History:  Diagnosis Date  . Asthma   . Combined forms of age-related cataract of both eyes 05/27/2017  . Diabetes mellitus    pre diabetes  . Hyperlipidemia   . Hypertension   . 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 Posterior vitreous detachment of both eyes 05/27/2017    Past Surgical History:  Procedure Laterality Date  . APPENDECTOMY    . CATARACT EXTRACTION Right   .  OVARIAN CYST REMOVAL      Family History  Problem Relation Age of Onset  . Diabetes Mother   . Heart attack Father 52       deceased  . Cancer Sister        brain tumor  . Cancer Brother        prostrate and lung CA  . COPD Sister     Social History   Socioeconomic History  . Marital status: Widowed    Spouse name: Not on file  . Number of children: Not on file  . Years of education: Not on file  . Highest education level: Not on file  Occupational History  . Not on file  Tobacco Use  . Smoking status: Former Smoker    Quit date: 11/11/1980    Years since quitting: 40.1  . Smokeless tobacco: Never Used  Substance and Sexual Activity  . Alcohol use: No  . Drug use: No  . 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  Other Topics Concern  . Not on file  Social History Narrative   Lives home alone, widowed.  Works at [employer].  Education 12th grade.  Children 3.     Social Determinants of Health   Financial Resource Strain: Not on file  Food Insecurity: Not on file  Transportation Needs: Not on file  Physical Activity: Not on file  Stress: Not on file  Social Connections: Not on file  Intimate Partner Violence: Not on file    Outpatient Medications Prior to Visit  Medication Sig Dispense Refill  . albuterol (VENTOLIN HFA) 108 (90 Base) MCG/ACT inhaler INHALE 2 PUFFS BY MOUTH ONCE DAILY AS DIRECTED 18 g 2  . Alcohol Swabs (B-D SINGLE USE SWABS REGULAR) PADS 1 application by Does not apply route daily as needed (to use with injections). 100 each 1  . AMBULATORY NON FORMULARY MEDICATION Nebulizer machine and tubing  Dx:  COPD exacerbation 1 Units 0  . AMBULATORY NON FORMULARY MEDICATION 2 (two) times daily. Osteobiflex    . AMBULATORY NON FORMULARY MEDICATION Accu-Check Aviva Plus test strips Check blood sugar twice a day Dx: E11.8 100 each 1  . aspirin 500 MG tablet Take 500 mg by mouth every 6 (six) hours as  needed for pain.    . Blood Glucose Monitoring Suppl (TRUE METRIX METER) w/Device KIT USE AS DIRECTED 1 kit 0  . Fluticasone-Salmeterol (WIXELA INHUB) 250-50 MCG/DOSE AEPB INHALE 1 PUFF INTO THE LUNGS EVERY 12 HOURS 180 each 3  . glucose blood (TRUE METRIX BLOOD GLUCOSE TEST) test strip TEST BLOOD SUGAR TWICE DAILY AS NEEDED 200 strip 1  . pravastatin (PRAVACHOL) 80 MG tablet Take 1 tablet (80 mg total) by mouth daily. 90 tablet 3  . triamcinolone cream (KENALOG) 0.1 % Apply 1 application topically 2 (two) times daily. 30 g 0  . lisinopril-hydrochlorothiazide (ZESTORETIC) 20-25 MG tablet Take 1 tablet by mouth daily. 90 tablet 1  . metFORMIN (GLUCOPHAGE) 500 MG tablet Take 1 tablet (500 mg total) by mouth 2 (two) times daily with a meal. 180 tablet 1  . ipratropium-albuterol (DUONEB) 0.5-2.5 (3) MG/3ML SOLN TAKE 3 MLS BY NEBULIZATION EVERY 6 (SIX) HOURS AS NEEDED. (Patient not taking: Reported on 12/06/2020) 360 mL 0  . TRUEplus Lancets 33G MISC TEST BLOOD SUGAR TWICE DAILY AS NEEDED (Patient not taking: Reported on 12/06/2020) 200 each 1  . fenofibrate (TRICOR) 145 MG tablet Take 1 tablet (145 mg total) by mouth daily. (Patient not taking: No sig reported) 90 tablet 3   Facility-Administered Medications Prior to Visit  Medication Dose Route Frequency Provider Last Rate Last Admin  . iopamidol (ISOVUE-300) 61 % injection 80 mL  80 mL Intravenous Once PRN [name], [name], PA-C        Allergies  Allergen Reactions  . Peanut-Containing Drug Products Shortness Of Breath    Restricted breathing  . Livalo [Pitavastatin] Other (See Comments)    Muscle cramps  . Mucinex [Guaifenesin Er] Other (See Comments)    Made blood sugar go up to 212.   . Prednisone Other (See Comments)    Bloating and abdominal pain, with oral product only.  . Zocor [Simvastatin] Other (See Comments)    Muscle cramps   . Amlodipine Other (See Comments)    Dizziness   . Tramadol     Somnolence.     ROS Review of  Systems  Constitutional: Negative for activity change, appetite change, diaphoresis and fatigue.  HENT: Negative for congestion, rhinorrhea, sinus pressure and sneezing.   Eyes: Negative for redness and itching.  Respiratory: Negative for cough, chest tightness, shortness of breath and wheezing.   Cardiovascular: Positive for chest pain. Negative for palpitations.  Gastrointestinal: Negative for abdominal distention, constipation, diarrhea, nausea and vomiting.  Endocrine: Negative for polydipsia and  polyuria.  Genitourinary: Negative for dysuria, frequency and urgency.  Musculoskeletal: Negative for arthralgias, myalgias and neck pain.  Skin:       Lesion on right thumb and similar lesion developing under nail of left thumb.    Neurological: Negative for dizziness, syncope, weakness, numbness and headaches.  Psychiatric/Behavioral: Negative for confusion. The patient is not nervous/anxious.       Objective:    Physical Exam Chaperone present: "Stinging" pain underneath left breast not tender to palpation.  Patient reports she feels it when she inhales deeply.    Constitutional:      Appearance: Normal appearance. She is not ill-appearing.  HENT:     Head: Normocephalic.  Eyes:     Extraocular Movements: Extraocular movements intact.     Pupils: Pupils are equal, round, and reactive to light.  Cardiovascular:     Rate and Rhythm: Normal rate and regular rhythm.     Pulses: Normal pulses.     Heart sounds: Normal heart sounds. No murmur heard. No friction rub. No gallop.   Pulmonary:     Effort: Pulmonary effort is normal. No respiratory distress.     Breath sounds: Normal breath sounds. No wheezing, rhonchi or rales.  Chest:     Chest wall: Tenderness present. No mass, swelling or crepitus.    Abdominal:     General: Abdomen is flat. There is no distension.     Tenderness: There is no abdominal tenderness. There is no guarding.  Musculoskeletal:        General: No swelling  or tenderness. Normal range of motion.  Skin:    General: Skin is warm and dry.     Capillary Refill: Capillary refill takes less than 2 seconds.       Neurological:     General: No focal deficit present.     Mental Status: She is alert and oriented to person, place, and time. Mental status is at baseline.  Psychiatric:        Mood and Affect: Mood normal.        Behavior: Behavior normal.     BP 125/68   Pulse 75   Ht '5\' 3"'  (1.6 m)   Wt 163 lb (73.9 kg)   SpO2 98%   BMI 28.87 kg/m  Wt Readings from Last 3 Encounters:  12/06/20 163 lb (73.9 kg)  08/28/20 159 lb (72.1 kg)  05/19/20 160 lb (72.6 kg)     There are no preventive care reminders to display for this patient.  There are no preventive care reminders to display for this patient.  Lab Results  Component Value Date   TSH 2.034 07/07/2012   Lab Results  Component Value Date   WBC 4.1 07/02/2019   HGB 12.1 07/02/2019   HCT 35.8 07/02/2019   MCV 84.2 07/02/2019   PLT 193 07/02/2019   Lab Results  Component Value Date   NA 138 12/07/2020   K 4.1 12/07/2020   CO2 30 12/07/2020   GLUCOSE 111 12/07/2020   BUN 18 12/07/2020   CREATININE 1.02 (H) 12/07/2020   BILITOT 0.4 12/07/2020   ALKPHOS 46 04/22/2017   AST 15 12/07/2020   ALT 10 12/07/2020   PROT 6.8 12/07/2020   ALBUMIN 3.6 04/22/2017   CALCIUM 10.4 12/07/2020   Lab Results  Component Value Date   CHOL 226 (H) 12/07/2020   Lab Results  Component Value Date   HDL 47 (L) 12/07/2020   Lab Results  Component Value Date  LDLCALC 137 (H) 12/07/2020   Lab Results  Component Value Date   TRIG 271 (H) 12/07/2020   Lab Results  Component Value Date   CHOLHDL 4.8 12/07/2020   Lab Results  Component Value Date   HGBA1C 6.3 (A) 12/06/2020      Assessment & Plan:  Marland KitchenMarland KitchenAlizon was seen today for diabetes.  Diagnoses and all orders for this visit:  Type 2 diabetes mellitus with complication, without long-term current use of insulin (HCC) -      POCT glycosylated hemoglobin (Hb A1C) -     COMPLETE METABOLIC PANEL WITH GFR -     metFORMIN (GLUCOPHAGE) 500 MG tablet; Take 1 tablet (500 mg total) by mouth 2 (two) times daily with a meal.  Rib pain on left side -     DG Ribs Unilateral W/Chest Left  Essential hypertension, benign -     Lipid Panel w/reflex Direct LDL -     lisinopril-hydrochlorothiazide (ZESTORETIC) 20-25 MG tablet; Take 1 tablet by mouth daily.  Pure hypercholesterolemia -     Lipid Panel w/reflex Direct LDL    Chest/Upper left abdominal pain:  Non tender to palpation, improves with exercise.  Has noticed transiently over past 3 weeks.  Mostly short lived.  Presentation does not suggest cardiac cause.  Plan for chest XR to look for skeletal causes. Diclofenac gel to use as needed.  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  Referral to dermatology provided.    Essential HTN:  125/68 well controlled.  Continue Zestoretic 20-25 mg tablet - refill provided.  Follow up in 3 months.  On statin.    Type 2 diabetes mellitus:   A1C at 6.3, home blood sugars maintained between 120-135.  Continue medications as before (Glucophage. Refill provided).  Recheck in 3 months.  On statin.  Patient has started 30 min walks 5 days per week.  Encouraged patient to continue this as well as implementing diabetic diet.  Eye exam UTD.  Foot exam UTD.  Pneumonia, flu, COVID UTD.    Health maintenance:  Collect CMP and Lipid in addition to A1C.  Results pending.       Follow-up: Return in about 3 months (around 03/06/2021).    Marland KitchenVernetta Honey PA-C, have reviewed and agree with the above documentation in it's entirety.

## 2020-12-07 DIAGNOSIS — I10 Essential (primary) hypertension: Secondary | ICD-10-CM | POA: Diagnosis not present

## 2020-12-07 DIAGNOSIS — E118 Type 2 diabetes mellitus with unspecified complications: Secondary | ICD-10-CM | POA: Diagnosis not present

## 2020-12-07 DIAGNOSIS — E7800 Pure hypercholesterolemia, unspecified: Secondary | ICD-10-CM | POA: Diagnosis not present

## 2020-12-07 LAB — COMPLETE METABOLIC PANEL WITH GFR
AG Ratio: 1.6 (calc) (ref 1.0–2.5)
ALT: 10 U/L (ref 6–29)
AST: 15 U/L (ref 10–35)
Albumin: 4.2 g/dL (ref 3.6–5.1)
Alkaline phosphatase (APISO): 55 U/L (ref 37–153)
BUN/Creatinine Ratio: 18 (calc) (ref 6–22)
BUN: 18 mg/dL (ref 7–25)
CO2: 30 mmol/L (ref 20–32)
Calcium: 10.4 mg/dL (ref 8.6–10.4)
Chloride: 101 mmol/L (ref 98–110)
Creat: 1.02 mg/dL — ABNORMAL HIGH (ref 0.60–0.93)
GFR, Est African American: 61 mL/min/{1.73_m2} (ref >=60)
GFR, Est Non African American: 52 mL/min/{1.73_m2} — ABNORMAL LOW (ref >=60)
Globulin: 2.6 g/dL (calc) (ref 1.9–3.7)
Glucose, Bld: 111 mg/dL (ref 65–139)
Potassium: 4.1 mmol/L (ref 3.5–5.3)
Sodium: 138 mmol/L (ref 135–146)
Total Bilirubin: 0.4 mg/dL (ref 0.2–1.2)
Total Protein: 6.8 g/dL (ref 6.1–8.1)

## 2020-12-07 LAB — LIPID PANEL W/REFLEX DIRECT LDL
Cholesterol: 226 mg/dL — ABNORMAL HIGH (ref <200)
HDL: 47 mg/dL — ABNORMAL LOW (ref >=50)
LDL Cholesterol (Calc): 137 mg/dL (calc) — ABNORMAL HIGH
Non-HDL Cholesterol (Calc): 179 mg/dL (calc) — ABNORMAL HIGH (ref <130)
Total CHOL/HDL Ratio: 4.8 (calc) (ref <5.0)
Triglycerides: 271 mg/dL — ABNORMAL HIGH (ref <150)

## 2020-12-07 NOTE — Progress Notes (Signed)
Lindsey Fischer,   You have some chronic bronchitis changes in the lungs(we knew about this) but no acute findings in the chest/ribs to account for pain.

## 2020-12-08 ENCOUNTER — Encounter: Payer: Self-pay | Admitting: Physician Assistant

## 2020-12-08 ENCOUNTER — Ambulatory Visit (INDEPENDENT_AMBULATORY_CARE_PROVIDER_SITE_OTHER): Payer: Medicare HMO | Admitting: Physician Assistant

## 2020-12-08 DIAGNOSIS — Z0000 Encounter for general adult medical examination without abnormal findings: Secondary | ICD-10-CM

## 2020-12-08 DIAGNOSIS — R0781 Pleurodynia: Secondary | ICD-10-CM | POA: Insufficient documentation

## 2020-12-08 NOTE — Progress Notes (Signed)
Lindsey Fischer,   Your LDL, bad cholesterol is not to goal. You are taking pravachol daily?  Your kidney function is just a little down from baseline. Make sure not taking any ibuprofen/motrin/aleve OTC and staying hydrated. Can use tylenol for pain.

## 2020-12-08 NOTE — Progress Notes (Signed)
MEDICARE ANNUAL WELLNESS VISIT  12/08/2020  Telephone Visit Disclaimer This Medicare AWV was conducted by telephone due to national recommendations for restrictions regarding the COVID-19 Pandemic (e.g. social distancing).  I verified, using two identifiers, that I am speaking with Lindsey Fischer or their authorized healthcare agent. I discussed the limitations, risks, security, and privacy concerns of performing an evaluation and management service by telephone and the potential availability of an in-person appointment in the future. The patient expressed understanding and agreed to proceed.  Location of Patient: Home Location of Provider (nurse):  In the office  Subjective:    Lindsey Fischer is a 80 y.o. female patient of [name], PA-C who had a Medicare Annual Wellness Visit today via telephone. Lindsey Fischer is Retired and lives alone. she has 3 children. she reports that she is socially active and does interact with friends/family regularly. she is minimally physically active and enjoys reading.  Patient Care Team: Lavada Mesi as PCP - General (Family Medicine)  Advanced Directives 12/08/2020 09/09/2017  Does Patient Have a Medical Advance Directive? Yes No  Type of Paramedic of [place];Living will -  Does patient want to make changes to medical advance directive? No - Patient declined -  Copy of [place] in Chart? No - copy requested -  Would patient like information on creating a medical advance directive? - Yes (ED - Information included in AVS)    Hospital Utilization Over the Past 12 Months: # of hospitalizations or ER visits: 0 # of surgeries: 0  Review of Systems    Patient reports that her overall health is unchanged compared to last year.  History obtained from chart review and the patient  Patient Reported Readings (BP, Pulse, CBG, Weight, etc) none  Pain Assessment Pain : No/denies pain     Current  Medications & Allergies (verified) Allergies as of 12/08/2020      Reactions   Peanut-containing Drug Products Shortness Of Breath   Restricted breathing   Livalo [pitavastatin] Other (See Comments)   Muscle cramps   Mucinex [guaifenesin Er] Other (See Comments)   Made blood sugar go up to 212.    Prednisone Other (See Comments)   Bloating and abdominal pain, with oral product only.   Zocor [simvastatin] Other (See Comments)   Muscle cramps   Amlodipine Other (See Comments)   Dizziness   Tramadol    Somnolence.       Medication List       Accurate as of December 08, 2020  2:27 PM. If you have any questions, ask your nurse or doctor.        albuterol 108 (90 Base) MCG/ACT inhaler Commonly known as: Ventolin HFA INHALE 2 PUFFS BY MOUTH ONCE DAILY AS DIRECTED   AMBULATORY NON FORMULARY MEDICATION 2 (two) times daily. Osteobiflex   AMBULATORY NON FORMULARY MEDICATION Nebulizer machine and tubing  Dx:  COPD exacerbation   AMBULATORY NON FORMULARY MEDICATION Accu-Check Aviva Plus test strips Check blood sugar twice a day Dx: E11.8   aspirin 500 MG tablet Take 500 mg by mouth every 6 (six) hours as needed for pain.   B-D SINGLE USE SWABS REGULAR Pads 1 application by Does not apply route daily as needed (to use with injections).   Fluticasone-Salmeterol 250-50 MCG/DOSE Aepb Commonly known as: Wixela Inhub INHALE 1 PUFF INTO THE LUNGS EVERY 12 HOURS   ipratropium-albuterol 0.5-2.5 (3) MG/3ML Soln Commonly known as: DUONEB TAKE 3 MLS  BY NEBULIZATION EVERY 6 (SIX) HOURS AS NEEDED.   lisinopril-hydrochlorothiazide 20-25 MG tablet Commonly known as: ZESTORETIC Take 1 tablet by mouth daily.   metFORMIN 500 MG tablet Commonly known as: GLUCOPHAGE Take 1 tablet (500 mg total) by mouth 2 (two) times daily with a meal.   pravastatin 80 MG tablet Commonly known as: PRAVACHOL Take 1 tablet (80 mg total) by mouth daily.   triamcinolone 0.1 % Commonly known as:  KENALOG Apply 1 application topically 2 (two) times daily.   True Metrix Blood Glucose Test test strip Generic drug: glucose blood TEST BLOOD SUGAR TWICE DAILY AS NEEDED   True Metrix Meter w/Device Kit USE AS DIRECTED   TRUEplus Lancets 33G Misc TEST BLOOD SUGAR TWICE DAILY AS NEEDED       History (reviewed): Past Medical History:  Diagnosis Date  . Asthma   . Combined forms of age-related cataract of both eyes 05/27/2017  . Diabetes mellitus    pre diabetes  . Hyperlipidemia   . Hypertension   . 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 Posterior vitreous detachment of both eyes 05/27/2017   Past Surgical History:  Procedure Laterality Date  . APPENDECTOMY    . CATARACT EXTRACTION Right   . OVARIAN CYST REMOVAL     Family History  Problem Relation Age of Onset  . Diabetes Mother   . Heart attack Father 86       deceased  . Cancer Sister        brain tumor  . Cancer Brother        prostrate and lung CA  . COPD Sister    Social History   Socioeconomic History  . Marital status: Widowed    Spouse name: Not on file  . Number of children: Not on file  . Years of education: Not on file  . Highest education level: Not on file  Occupational History  . Not on file  Tobacco Use  . Smoking status: Former Smoker    Quit date: 11/11/1980    Years since quitting: 40.1  . Smokeless tobacco: Never Used  Substance and Sexual Activity  . Alcohol use: No  . Drug use: No  . 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  Other Topics Concern  . Not on file  Social History Narrative   Lives home alone, widowed.  Works at [employer].  Education 12th grade.  Children 3.     Social Determinants of Health   Financial Resource Strain: Low Risk   . Difficulty of Paying Living Expenses: Not hard at all  Food Insecurity: No Food Insecurity  . Worried About Charity fundraiser in the Last Year: Never true  . Ran Out of Food in  the Last Year: Never true  Transportation Needs: No Transportation Needs  . Lack of Transportation (Medical): No  . Lack of Transportation (Non-Medical): No  Physical Activity: Inactive  . Days of Exercise per Week: 0 days  . Minutes of Exercise per Session: 0 min  Stress: No Stress Concern Present  . Feeling of Stress : Not at all  Social Connections: Moderately Isolated  . Frequency of Communication with Friends and Family: More than three times a week  . Frequency of Social Gatherings with Friends and Family: Once a week  . Attends Religious Services: More than 4 times per year  . Active Member of Clubs or Organizations: No  . Attends Club  or Organization Meetings: Never  . Marital Status: Widowed    Activities of Daily Living In your present state of health, do you have any difficulty performing the following activities: 12/08/2020  Hearing? N  Vision? N  Difficulty concentrating or making decisions? N  Walking or climbing stairs? N  Dressing or bathing? N  Doing errands, shopping? N  Preparing Food and eating ? N  Using the Toilet? N  In the past six months, have you accidently leaked urine? N  Do you have problems with loss of bowel control? N  Managing your Medications? N  Managing your Finances? N  Housekeeping or managing your Housekeeping? N  Some recent data might be hidden    Patient Education/ Literacy What is the last grade level you completed in school?: 12th grade  Exercise Current Exercise Habits: The patient does not participate in regular exercise at present, Exercise limited by: None identified  Diet Patient reports consuming 2 meals a day and 1 snack(s) a day Patient reports that her primary diet is: Regular Patient reports that she does have regular access to food.   Depression Screen PHQ 2/9 Scores 12/08/2020 05/04/2019 05/01/2018 04/22/2017  PHQ - 2 Score 0 0 4 0  PHQ- 9 Score 0 4 15 -  Exception Documentation - - Medical reason -     Fall  Risk Fall Risk  12/08/2020 12/08/2017  Falls in the past year? 0 No  Number falls in past yr: 0 -  Injury with Fall? 0 -  Risk for fall due to : No Fall Risks -  Follow up Falls evaluation completed -     Objective:  Lindsey Fischer seemed alert and oriented and she participated appropriately during our telephone visit.  Blood Pressure Weight BMI  BP Readings from Last 3 Encounters:  12/06/20 125/68  08/28/20 (!) 135/58  05/19/20 (!) 111/56   Wt Readings from Last 3 Encounters:  12/06/20 163 lb (73.9 kg)  08/28/20 159 lb (72.1 kg)  05/19/20 160 lb (72.6 kg)   BMI Readings from Last 1 Encounters:  12/06/20 28.87 kg/m    *Unable to obtain current vital signs, weight, and BMI due to telephone visit type  Hearing/Vision  . Eulia did  seem to have difficulty with hearing/understanding during the telephone conversation . Reports that she has not had a formal eye exam by an eye care professional within the past year . Reports that she has not had a formal hearing evaluation within the past year *Unable to fully assess hearing and vision during telephone visit type  Cognitive Function: 6CIT Screen 12/08/2020  What Year? 0 points  What month? 0 points  What time? 0 points  Count back from 20 0 points  Months in reverse 0 points  Repeat phrase 2 points  Total Score 2   (Normal:0-7, Significant for Dysfunction: >8)  Normal Cognitive Function Screening: Yes   Immunization & Health Maintenance Record Immunization History  Administered Date(s) Administered  . Fluad Quad(high Dose 65+) 11/15/2019, 08/28/2020  . Influenza Whole 09/11/2010  . Influenza,inj,Quad PF,6+ Mos 07/16/2013, 07/24/2015, 08/18/2017, 08/03/2018  . Influenza-Unspecified 07/31/2012, 08/04/2014, 08/25/2016  . PFIZER(Purple Top)SARS-COV-2 Vaccination 03/20/2020, 04/10/2020  . Pneumococcal Conjugate-13 01/20/2015  . Pneumococcal Polysaccharide-23 09/11/2010  . Tdap 03/05/2012    Health Maintenance  Topic  Date Due  . COVID-19 Vaccine (3 - Booster for [place] series) 12/22/2020 (Originally 10/10/2020)  . Hepatitis C Screening  12/06/2021 (Originally 11-04-1941)  . HEMOGLOBIN A1C  06/05/2021  . OPHTHALMOLOGY EXAM  10/20/2021  . MAMMOGRAM  11/16/2021  . FOOT EXAM  12/06/2021  . TETANUS/TDAP  03/05/2022  . INFLUENZA VACCINE  Completed  . DEXA SCAN  Completed  . PNA vac Low Risk Adult  Completed       Assessment  This is a routine wellness examination for Lindsey Fischer.  Health Maintenance: Due or Overdue There are no preventive care reminders to display for this patient.  Lindsey Fischer does not need a referral for [employer] Assistance: Care Management:   no Social Work:    no Prescription Assistance:  no Nutrition/Diabetes Education:  no   Plan:  Personalized Goals Goals Addressed              This Visit's Progress   .  Patient Stated (pt-stated)        12/08/2020 AWV Goal: Diabetes Management  . Patient will maintain an A1C level below 8.0 . Patient will not develop any diabetic foot complications . Patient will not experience any hypoglycemic episodes over the next 3 months . Patient will notify our office of any CBG readings outside of the provider recommended range by calling [phone] . Patient will adhere to provider recommendations for diabetes management  Patient Self Management Activities . take all medications as prescribed and report any negative side effects . monitor and record blood sugar readings as directed . adhere to a low carbohydrate diet that incorporates lean proteins, vegetables, whole grains, low glycemic fruits . check feet daily noting any sores, cracks, injuries, or callous formations . see PCP or podiatrist if she notices any changes in her legs, feet, or toenails . Patient will visit PCP and have an A1C level checked every 3 to 6 months as directed  . have a yearly eye exam to monitor for vascular changes associated with diabetes and will  request that the report be sent to her pcp.  . consult with her PCP regarding any changes in her health or new or worsening symptoms       Personalized Health Maintenance & Screening Recommendations  Shingrix  Lung Cancer Screening Recommended: no (Low Dose CT Chest recommended if Age 53-80 years, 30 pack-year currently smoking OR have quit w/in past 15 years) Hepatitis C Screening recommended: yes HIV Screening recommended: no  Advanced Directives: Written information was prepared per patient's request.  Referrals & Orders No orders of the defined types were placed in this encounter.   Follow-up Plan . Follow-up with [name], PA-C as planned . Schedule your appointment for your shingrix.     I have personally reviewed and noted the following in the patient's chart:   . Medical and social history . Use of alcohol, tobacco or illicit drugs  . Current medications and supplements . Functional ability and status . Nutritional status . Physical activity . Advanced directives . List of other physicians . Hospitalizations, surgeries, and ER visits in previous 12 months . Vitals . Screenings to include cognitive, depression, and falls . Referrals and appointments  In addition, I have reviewed and discussed with Lindsey Fischer certain preventive protocols, quality metrics, and best practice recommendations. A written personalized care plan for preventive services as well as general preventive health recommendations is available and can be mailed to the patient at her request.      Tinnie Gens, RN  12/08/2020

## 2020-12-08 NOTE — Patient Instructions (Addendum)
Fat and Cholesterol Restricted Eating Plan Getting too much fat and cholesterol in your diet may cause health problems. Choosing the right foods helps keep your fat and cholesterol at normal levels. This can keep you from getting certain diseases. Your doctor may recommend an eating plan that includes:  Total fat: ______% or less of total calories a day.  Saturated fat: ______% or less of total calories a day.  Cholesterol: less than _________mg a day.  Fiber: ______g a day. What are tips for following this plan? Meal planning  At meals, divide your plate into four equal parts: ? Fill one-half of your plate with vegetables and green salads. ? Fill one-fourth of your plate with whole grains. ? Fill one-fourth of your plate with low-fat (lean) protein foods.  Eat fish that is high in omega-3 fats at least two times a week. This includes mackerel, tuna, sardines, and salmon.  Eat foods that are high in fiber, such as whole grains, beans, apples, broccoli, carrots, peas, and barley. General tips  Work with your doctor to lose weight if you need to.  Avoid: ? Foods with added sugar. ? Fried foods. ? Foods with partially hydrogenated oils.  Limit alcohol intake to no more than 1 drink a day for nonpregnant women and 2 drinks a day for men. One drink equals 12 oz of beer, 5 oz of wine, or 1 oz of hard liquor.   Reading food labels  Check food labels for: ? Trans fats. ? Partially hydrogenated oils. ? Saturated fat (g) in each serving. ? Cholesterol (mg) in each serving. ? Fiber (g) in each serving.  Choose foods with healthy fats, such as: ? Monounsaturated fats. ? Polyunsaturated fats. ? Omega-3 fats.  Choose grain products that have whole grains. Look for the word "whole" as the first word in the ingredient list. Cooking  Cook foods using low-fat methods. These include baking, boiling, grilling, and broiling.  Eat more home-cooked foods. Eat at restaurants and  buffets less often.  Avoid cooking using saturated fats, such as butter, cream, palm oil, palm kernel oil, and coconut oil. Recommended foods Fruits  All fresh, canned (in natural juice), or frozen fruits. Vegetables  Fresh or frozen vegetables (raw, steamed, roasted, or grilled). Green salads. Grains  Whole grains, such as whole wheat or whole grain breads, crackers, cereals, and pasta. Unsweetened oatmeal, bulgur, barley, quinoa, or brown rice. Corn or whole wheat flour tortillas. Meats and other protein foods  Ground beef (85% or leaner), grass-fed beef, or beef trimmed of fat. Skinless chicken or [place]. Ground chicken or [place]. Pork trimmed of fat. All fish and seafood. Egg whites. Dried beans, peas, or lentils. Unsalted nuts or seeds. Unsalted canned beans. Nut butters without added sugar or oil. Dairy  Low-fat or nonfat dairy products, such as skim or 1% milk, 2% or reduced-fat cheeses, low-fat and fat-free ricotta or cottage cheese, or plain low-fat and nonfat yogurt. Fats and oils  Tub margarine without trans fats. Light or reduced-fat mayonnaise and salad dressings. Avocado. Olive, canola, sesame, or safflower oils. The items listed above may not be a complete list of foods and beverages you can eat. Contact a dietitian for more information.   Foods to avoid Fruits  Canned fruit in heavy syrup. Fruit in cream or butter sauce. Fried fruit. Vegetables  Vegetables cooked in cheese, cream, or butter sauce. Fried vegetables. Grains  White bread. White pasta. White rice. Cornbread. Bagels, pastries, and croissants. Crackers and snack foods that  contain trans fat and hydrogenated oils. Meats and other protein foods  Fatty cuts of meat. Ribs, chicken wings, bacon, sausage, bologna, salami, chitterlings, fatback, hot dogs, bratwurst, and packaged lunch meats. Liver and organ meats. Whole eggs and egg yolks. Chicken and [place] with skin. Fried meat. Dairy  Whole or 2% milk,  cream, half-and-half, and cream cheese. Whole milk cheeses. Whole-fat or sweetened yogurt. Full-fat cheeses. Nondairy creamers and whipped toppings. Processed cheese, cheese spreads, and cheese curds. Beverages  Alcohol. Sugar-sweetened drinks such as sodas, lemonade, and fruit drinks. Fats and oils  Butter, stick margarine, lard, shortening, ghee, or bacon fat. Coconut, palm kernel, and palm oils. Sweets and desserts  Corn syrup, sugars, honey, and molasses. Candy. Jam and jelly. Syrup. Sweetened cereals. Cookies, pies, cakes, donuts, muffins, and ice cream. The items listed above may not be a complete list of foods and beverages you should avoid. Contact a dietitian for more information. Summary  Choosing the right foods helps keep your fat and cholesterol at normal levels. This can keep you from getting certain diseases.  At meals, fill one-half of your plate with vegetables and green salads.  Eat high-fiber foods, like whole grains, beans, apples, carrots, peas, and barley.  Limit added sugar, saturated fats, alcohol, and fried foods. This information is not intended to replace advice given to you by your health care provider. Make sure you discuss any questions you have with your health care provider. Document Revised: 03/01/2020 Document Reviewed: 03/01/2020 Elsevier Patient Education  2021 [place].   Diabetes Mellitus and [place] care is an important part of your health, especially when you have diabetes. Diabetes may cause you to have problems because of poor blood flow (circulation) to your feet and legs, which can cause your skin to:  Become thinner and drier.  Break more easily.  Heal more slowly.  Peel and crack. You may also have nerve damage (neuropathy) in your legs and feet, causing decreased feeling in them. This means that you may not notice minor injuries to your feet that could lead to more serious problems. Noticing and addressing any potential  problems early is the best way to prevent future foot problems. How to care for your feet Foot hygiene  Wash your feet daily with warm water and mild soap. Do not use hot water. Then, pat your feet and the areas between your toes until they are completely dry. Do not soak your feet as this can dry your skin.  Trim your toenails straight across. Do not dig under them or around the cuticle. File the edges of your nails with an emery board or nail file.  Apply a moisturizing lotion or petroleum jelly to the skin on your feet and to dry, brittle toenails. Use lotion that does not contain alcohol and is unscented. Do not apply lotion between your toes.   Shoes and socks  Wear clean socks or stockings every day. Make sure they are not too tight. Do not wear knee-high stockings since they may decrease blood flow to your legs.  Wear shoes that fit properly and have enough cushioning. Always look in your shoes before you put them on to be sure there are no objects inside.  To break in new shoes, wear them for just a few hours a day. This prevents injuries on your feet. Wounds, scrapes, corns, and calluses  Check your feet daily for blisters, cuts, bruises, sores, and redness. If you cannot see the bottom of your feet, use  a mirror or ask someone for help.  Do not cut corns or calluses or try to remove them with medicine.  If you find a minor scrape, cut, or break in the skin on your feet, keep it and the skin around it clean and dry. You may clean these areas with mild soap and water. Do not clean the area with peroxide, alcohol, or iodine.  If you have a wound, scrape, corn, or callus on your foot, look at it several times a day to make sure it is healing and not infected. Check for: ? Redness, swelling, or pain. ? Fluid or blood. ? Warmth. ? Pus or a bad smell.   General tips  Do not cross your legs. This may decrease blood flow to your feet.  Do not use heating pads or hot water bottles on  your feet. They may burn your skin. If you have lost feeling in your feet or legs, you may not know this is happening until it is too late.  Protect your feet from hot and cold by wearing shoes, such as at the beach or on hot pavement.  Schedule a complete foot exam at least once a year (annually) or more often if you have foot problems. Report any cuts, sores, or bruises to your health care provider immediately. Where to find more information  American Diabetes Association: www.diabetes.org  Association of Diabetes Care & Education Specialists: www.diabeteseducator.org Contact a health care provider if:  You have a medical condition that increases your risk of infection and you have any cuts, sores, or bruises on your feet.  You have an injury that is not healing.  You have redness on your legs or feet.  You feel burning or tingling in your legs or feet.  You have pain or cramps in your legs and feet.  Your legs or feet are numb.  Your feet always feel cold.  You have pain around any toenails. Get help right away if:  You have a wound, scrape, corn, or callus on your foot and: ? You have pain, swelling, or redness that gets worse. ? You have fluid or blood coming from the wound, scrape, corn, or callus. ? Your wound, scrape, corn, or callus feels warm to the touch. ? You have pus or a bad smell coming from the wound, scrape, corn, or callus. ? You have a fever. ? You have a red line going up your leg. Summary  Check your feet every day for blisters, cuts, bruises, sores, and redness.  Apply a moisturizing lotion or petroleum jelly to the skin on your feet and to dry, brittle toenails.  Wear shoes that fit properly and have enough cushioning.  If you have foot problems, report any cuts, sores, or bruises to your health care provider immediately.  Schedule a complete foot exam at least once a year (annually) or more often if you have foot problems. This information is  not intended to replace advice given to you by your health care provider. Make sure you discuss any questions you have with your health care provider. Document Revised: 05/18/2020 Document Reviewed: 05/18/2020 Elsevier Patient Education  [place] Maintenance, Female Adopting a healthy lifestyle and getting preventive care are important in promoting health and wellness. Ask your health care provider about:  The right schedule for you to have regular tests and exams.  Things you can do on your own to prevent diseases and keep yourself healthy. What should I know  about diet, weight, and exercise? Eat a healthy diet  Eat a diet that includes plenty of vegetables, fruits, low-fat dairy products, and lean protein.  Do not eat a lot of foods that are high in solid fats, added sugars, or sodium.   Maintain a healthy weight Body mass index (BMI) is used to identify weight problems. It estimates body fat based on height and weight. Your health care provider can help determine your BMI and help you achieve or maintain a healthy weight. Get regular exercise Get regular exercise. This is one of the most important things you can do for your health. Most adults should:  Exercise for at least 150 minutes each week. The exercise should increase your heart rate and make you sweat (moderate-intensity exercise).  Do strengthening exercises at least twice a week. This is in addition to the moderate-intensity exercise.  Spend less time sitting. Even light physical activity can be beneficial. Watch cholesterol and blood lipids Have your blood tested for lipids and cholesterol at 80 years of age, then have this test every 5 years. Have your cholesterol levels checked more often if:  Your lipid or cholesterol levels are high.  You are older than 80 years of age.  You are at high risk for heart disease. What should I know about cancer screening? Depending on your health history and  family history, you may need to have cancer screening at various ages. This may include screening for:  Breast cancer.  Cervical cancer.  Colorectal cancer.  Skin cancer.  Lung cancer. What should I know about heart disease, diabetes, and high blood pressure? Blood pressure and heart disease  High blood pressure causes heart disease and increases the risk of stroke. This is more likely to develop in people who have high blood pressure readings, are of African descent, or are overweight.  Have your blood pressure checked: ? Every 3-5 years if you are 53-64 years of age. ? Every year if you are 23 years old or older. Diabetes Have regular diabetes screenings. This checks your fasting blood sugar level. Have the screening done:  Once every three years after age 59 if you are at a normal weight and have a low risk for diabetes.  More often and at a younger age if you are overweight or have a high risk for diabetes. What should I know about preventing infection? Hepatitis B If you have a higher risk for hepatitis B, you should be screened for this virus. Talk with your health care provider to find out if you are at risk for hepatitis B infection. Hepatitis C Testing is recommended for:  Everyone born from [birth year] through 1965.  Anyone with known risk factors for hepatitis C. Sexually transmitted infections (STIs)  Get screened for STIs, including gonorrhea and chlamydia, if: ? You are sexually active and are younger than 80 years of age. ? You are older than 80 years of age and your health care provider tells you that you are at risk for this type of infection. ? Your sexual activity has changed since you were last screened, and you are at increased risk for chlamydia or gonorrhea. Ask your health care provider if you are at risk.  Ask your health care provider about whether you are at high risk for HIV. Your health care provider may recommend a prescription medicine to help prevent  HIV infection. If you choose to take medicine to prevent HIV, you should first get tested for HIV. You should then be  tested every 3 months for as long as you are taking the medicine. Pregnancy  If you are about to stop having your period (premenopausal) and you may become pregnant, seek counseling before you get pregnant.  Take 400 to 800 micrograms (mcg) of folic acid every day if you become pregnant.  Ask for birth control (contraception) if you want to prevent pregnancy. Osteoporosis and menopause Osteoporosis is a disease in which the bones lose minerals and strength with aging. This can result in bone fractures. If you are 84 years old or older, or if you are at risk for osteoporosis and fractures, ask your health care provider if you should:  Be screened for bone loss.  Take a calcium or vitamin D supplement to lower your risk of fractures.  Be given hormone replacement therapy (HRT) to treat symptoms of menopause. Follow these instructions at home: Lifestyle  Do not use any products that contain nicotine or tobacco, such as cigarettes, e-cigarettes, and chewing tobacco. If you need help quitting, ask your health care provider.  Do not use street drugs.  Do not share needles.  Ask your health care provider for help if you need support or information about quitting drugs. Alcohol use  Do not drink alcohol if: ? Your health care provider tells you not to drink. ? You are pregnant, may be pregnant, or are planning to become pregnant.  If you drink alcohol: ? Limit how much you use to 0-1 drink a day. ? Limit intake if you are breastfeeding.  Be aware of how much alcohol is in your drink. In the [place] one drink equals one 12 oz bottle of beer (355 mL), one 5 oz glass of wine (148 mL), or one 1 oz glass of hard liquor (44 mL). General instructions  Schedule regular health, dental, and eye exams.  Stay current with your vaccines.  Tell your health care provider if: ? You  often feel depressed. ? You have ever been abused or do not feel safe at home. Summary  Adopting a healthy lifestyle and getting preventive care are important in promoting health and wellness.  Follow your health care provider's instructions about healthy diet, exercising, and getting tested or screened for diseases.  Follow your health care provider's instructions on monitoring your cholesterol and blood pressure. This information is not intended to replace advice given to you by your health care provider. Make sure you discuss any questions you have with your health care provider. Document Revised: 10/21/2018 Document Reviewed: 10/21/2018 Elsevier Patient Education  2021 [place] Maintenance Summary and Written Plan of Care  Lindsey Fischer ,  Thank you for allowing me to perform your Medicare Annual Wellness Visit and for your ongoing commitment to your health.   Health Maintenance & Immunization History Health Maintenance  Topic Date Due  . COVID-19 Vaccine (3 - Booster for [place] series) 12/22/2020 (Originally 10/10/2020)  . Hepatitis C Screening  12/06/2021 (Originally Jan 04, 1941)  . HEMOGLOBIN A1C  06/05/2021  . OPHTHALMOLOGY EXAM  10/20/2021  . MAMMOGRAM  11/16/2021  . FOOT EXAM  12/06/2021  . TETANUS/TDAP  03/05/2022  . INFLUENZA VACCINE  Completed  . DEXA SCAN  Completed  . PNA vac Low Risk Adult  Completed   Immunization History  Administered Date(s) Administered  . Fluad Quad(high Dose 65+) 11/15/2019, 08/28/2020  . Influenza Whole 09/11/2010  . Influenza,inj,Quad PF,6+ Mos 07/16/2013, 07/24/2015, 08/18/2017, 08/03/2018  . Influenza-Unspecified 07/31/2012, 08/04/2014, 08/25/2016  . PFIZER(Purple  Top)SARS-COV-2 Vaccination 03/20/2020, 04/10/2020  . Pneumococcal Conjugate-13 01/20/2015  . Pneumococcal Polysaccharide-23 09/11/2010  . Tdap 03/05/2012    These are the patient goals that we discussed: Goals Addressed               This Visit's Progress   .  Patient Stated (pt-stated)        12/08/2020 AWV Goal: Diabetes Management  . Patient will maintain an A1C level below 8.0 . Patient will not develop any diabetic foot complications . Patient will not experience any hypoglycemic episodes over the next 3 months . Patient will notify our office of any CBG readings outside of the provider recommended range by calling [phone] . Patient will adhere to provider recommendations for diabetes management  Patient Self Management Activities . take all medications as prescribed and report any negative side effects . monitor and record blood sugar readings as directed . adhere to a low carbohydrate diet that incorporates lean proteins, vegetables, whole grains, low glycemic fruits . check feet daily noting any sores, cracks, injuries, or callous formations . see PCP or podiatrist if she notices any changes in her legs, feet, or toenails . Patient will visit PCP and have an A1C level checked every 3 to 6 months as directed  . have a yearly eye exam to monitor for vascular changes associated with diabetes and will request that the report be sent to her pcp.  . consult with her PCP regarding any changes in her health or new or worsening symptoms         This is a list of Health Maintenance Items that are overdue or due now: Shingrix  Orders/Referrals Placed Today: No orders of the defined types were placed in this encounter.  Follow-up Plan . Follow-up with [name], PA-C as planned . Schedule your appointment for your shingrix.

## 2020-12-11 ENCOUNTER — Other Ambulatory Visit: Payer: Self-pay | Admitting: Neurology

## 2020-12-11 ENCOUNTER — Other Ambulatory Visit: Payer: Self-pay | Admitting: Physician Assistant

## 2020-12-11 DIAGNOSIS — B079 Viral wart, unspecified: Secondary | ICD-10-CM | POA: Diagnosis not present

## 2020-12-11 MED ORDER — ROSUVASTATIN CALCIUM 20 MG PO TABS: 20.0000 mg | ORAL_TABLET | Freq: Every day | ORAL | 3 refills | Status: DC

## 2020-12-11 NOTE — Progress Notes (Signed)
Is she willing to try crestor? I know she had issues with lipitor. She would stop pravachol and start 20mg  once daily. #90 3 refills.

## 2021-01-03 DIAGNOSIS — H43813 Vitreous degeneration, bilateral: Secondary | ICD-10-CM | POA: Diagnosis not present

## 2021-01-03 DIAGNOSIS — H527 Unspecified disorder of refraction: Secondary | ICD-10-CM | POA: Diagnosis not present

## 2021-01-03 DIAGNOSIS — H18603 Keratoconus, unspecified, bilateral: Secondary | ICD-10-CM | POA: Diagnosis not present

## 2021-01-03 DIAGNOSIS — E119 Type 2 diabetes mellitus without complications: Secondary | ICD-10-CM | POA: Diagnosis not present

## 2021-01-03 DIAGNOSIS — H40003 Preglaucoma, unspecified, bilateral: Secondary | ICD-10-CM | POA: Diagnosis not present

## 2021-01-03 DIAGNOSIS — Z961 Presence of intraocular lens: Secondary | ICD-10-CM | POA: Diagnosis not present

## 2021-01-03 DIAGNOSIS — H52212 Irregular astigmatism, left eye: Secondary | ICD-10-CM | POA: Diagnosis not present

## 2021-01-03 DIAGNOSIS — H25812 Combined forms of age-related cataract, left eye: Secondary | ICD-10-CM | POA: Diagnosis not present

## 2021-01-03 DIAGNOSIS — H2640 Unspecified secondary cataract: Secondary | ICD-10-CM | POA: Diagnosis not present

## 2021-01-23 ENCOUNTER — Encounter: Payer: Self-pay | Admitting: Physician Assistant

## 2021-02-08 DIAGNOSIS — B079 Viral wart, unspecified: Secondary | ICD-10-CM | POA: Diagnosis not present

## 2021-03-02 ENCOUNTER — Other Ambulatory Visit: Payer: Self-pay | Admitting: Physician Assistant

## 2021-03-05 DIAGNOSIS — B079 Viral wart, unspecified: Secondary | ICD-10-CM | POA: Diagnosis not present

## 2021-03-06 ENCOUNTER — Encounter: Payer: Self-pay | Admitting: Physician Assistant

## 2021-03-06 ENCOUNTER — Other Ambulatory Visit: Payer: Self-pay

## 2021-03-06 ENCOUNTER — Ambulatory Visit (INDEPENDENT_AMBULATORY_CARE_PROVIDER_SITE_OTHER): Payer: Medicare HMO | Admitting: Physician Assistant

## 2021-03-06 VITALS — BP 145/64 | HR 81 | Ht 63.0 in | Wt 162.0 lb

## 2021-03-06 DIAGNOSIS — E11319 Type 2 diabetes mellitus with unspecified diabetic retinopathy without macular edema: Secondary | ICD-10-CM | POA: Diagnosis not present

## 2021-03-06 DIAGNOSIS — N183 Chronic kidney disease, stage 3 unspecified: Secondary | ICD-10-CM

## 2021-03-06 DIAGNOSIS — I10 Essential (primary) hypertension: Secondary | ICD-10-CM | POA: Diagnosis not present

## 2021-03-06 DIAGNOSIS — E1122 Type 2 diabetes mellitus with diabetic chronic kidney disease: Secondary | ICD-10-CM | POA: Diagnosis not present

## 2021-03-06 DIAGNOSIS — E118 Type 2 diabetes mellitus with unspecified complications: Secondary | ICD-10-CM

## 2021-03-06 DIAGNOSIS — J4520 Mild intermittent asthma, uncomplicated: Secondary | ICD-10-CM | POA: Diagnosis not present

## 2021-03-06 DIAGNOSIS — H259 Unspecified age-related cataract: Secondary | ICD-10-CM | POA: Diagnosis not present

## 2021-03-06 LAB — POCT GLYCOSYLATED HEMOGLOBIN (HGB A1C): Hemoglobin A1C: 6.8 % — AB (ref 4.0–5.6)

## 2021-03-06 NOTE — Progress Notes (Signed)
Subjective:    Patient ID: Lindsey Fischer, female    DOB: 03/09/1941, 80 y.o.   MRN: [id number]  HPI  Patient is a 80 year old female with type 2 diabetes, hypertension, dyslipidemia, asthma who presents to the clinic for 38-month follow-up.  Last A1c was 6.3.  Patient admits she has not done the best with her diet over the past 3 months.  Patient denies any hypoglycemic events, open sores or wounds.  Patient is compliant with her metformin 500 mg twice daily with meals.  She denies any concerns or complaints.  Patient is compliant with her blood pressure medication.  She denies any chest pain, palpitations, headaches, vision changes.  She checks her blood pressures at home and they are ranging under 130/80.  Patient is currently being treated for a viral wart.  She had a procedure yesterday and her right thumb is very painful.  She feels like this is spiking her blood pressure today.  She has been taking aspirin and Tylenol.  It does seem to be a little better today.   .. Active Ambulatory Problems    Diagnosis Date Noted  . Dyslipidemia 01/19/2010  . ESSENTIAL HYPERTENSION, BENIGN 03/27/2010  . Asthma 01/19/2010  . Metabolic syndrome X 24/26/8341  . Type 2 diabetes mellitus with complication, without long-term current use of insulin ([facility]) 11/15/2012  . Umbilical hernia 96/22/2979  . Diverticulosis 12/20/2015  . Gastritis and gastroduodenitis 12/20/2015  . Dyslipidemia, goal LDL below 70 04/22/2016  . New daily persistent headache 10/22/2016  . Pure hypercholesterolemia 04/22/2017  . Elevated serum creatinine 04/23/2017  . Combined forms of age-related cataract of both eyes 05/27/2017  . 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 Posterior vitreous detachment of both eyes 05/27/2017  . CKD (chronic kidney disease) stage 3, GFR 30-59 ml/min (HCC) 06/03/2017  . Neck stiffness 08/20/2017  . Numbness 08/20/2017  . Chronic neck pain 01/16/2018  . 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 Cervical radiculopathy 01/29/2018  . Ingrown toenail of left foot 07/31/2018  . Nausea 07/02/2019  . BPPV (benign paroxysmal positional vertigo), right 02/18/2020  . Hypertriglyceridemia 02/18/2020  . Seborrheic dermatitis 05/22/2020  . Atopic dermatitis 08/28/2020  . Rib pain on left side 12/08/2020  . Age-related cataract of left eye 03/06/2021   Resolved Ambulatory Problems    Diagnosis Date Noted  . GRIEF REACTION, ACUTE 03/27/2010  . Impaired fasting glucose 03/27/2010  . Hypoxemia 05/22/2010  . Pneumonia 05/06/2011  . Epigastric abdominal tenderness with rebound tenderness 12/19/2015  . Non-intractable cyclical vomiting with nausea 12/19/2015  . Abdominal guarding 12/19/2015  . Diarrhea 12/19/2015  . Helicobacter pylori gastritis 12/20/2015  . Atypical chest pain 10/22/2016  . Neck muscle spasm 08/20/2017  . Other headache syndrome 08/20/2017  . Pneumonia of both lower lobes due to infectious organism 07/06/2019   Past Medical History:  Diagnosis Date  . Asthma   . Diabetes mellitus   . Hyperlipidemia   . Hypertension      Review of Systems  All other systems reviewed and are negative.      Objective:   Physical Exam Vitals reviewed.  Constitutional:      Appearance: Normal appearance.  HENT:     Head: Normocephalic.  Cardiovascular:     Rate and Rhythm: Normal rate and regular rhythm.     Pulses: Normal pulses.     Heart sounds: Normal heart sounds.  Pulmonary:     Effort: Pulmonary effort is normal.  Breath sounds: Normal breath sounds.  Musculoskeletal:     Right lower leg: No edema.     Left lower leg: No edema.  Neurological:     General: No focal deficit present.     Mental Status: She is alert and oriented to person, place, and time.  Psychiatric:        Mood and Affect: Mood normal.       .. Results for orders placed or performed in visit on 03/06/21  POCT glycosylated hemoglobin (Hb A1C)  Result Value Ref  Range   Hemoglobin A1C 6.8 (A) 4.0 - 5.6 %   HbA1c POC (<> result, manual entry)     HbA1c, POC (prediabetic range)     HbA1c, POC (controlled diabetic range)         Assessment & Plan:  Marland KitchenMarland KitchenPromyse was seen today for diabetes.  Diagnoses and all orders for this visit:  Type 2 diabetes mellitus with complication, without long-term current use of insulin (HCC) -     POCT glycosylated hemoglobin (Hb A1C)  Essential hypertension, benign  Mild intermittent asthma without complication  Stage 3 chronic kidney disease, unspecified whether stage 3a or 3b CKD (HCC)  Senile cataract of left eye, unspecified age-related cataract type   A1c is 6.8 today.  This is up from the 6.3 at last visit. I suggested increase of metformin.  Patient declined this today.  She would like to work more on diet management. Patient on ACE.  Her blood pressure is not to goal.  She reports readings at home is to goal.  She is in some pain today with her surgery on her thumb yesterday. On statin. Up-to-date foot exam and eye exam. She will have a left cataract removed in the near future. Flu, COVID, pneumonia shots up-to-date.  Labs are up-to-date drawn in January 2022.  Follow-up in 3 months.

## 2021-03-13 DIAGNOSIS — H52212 Irregular astigmatism, left eye: Secondary | ICD-10-CM | POA: Diagnosis not present

## 2021-03-13 DIAGNOSIS — H25812 Combined forms of age-related cataract, left eye: Secondary | ICD-10-CM | POA: Diagnosis not present

## 2021-03-20 DIAGNOSIS — H527 Unspecified disorder of refraction: Secondary | ICD-10-CM | POA: Diagnosis not present

## 2021-03-20 DIAGNOSIS — J45909 Unspecified asthma, uncomplicated: Secondary | ICD-10-CM | POA: Diagnosis not present

## 2021-03-20 DIAGNOSIS — H52212 Irregular astigmatism, left eye: Secondary | ICD-10-CM | POA: Diagnosis not present

## 2021-03-20 DIAGNOSIS — H25812 Combined forms of age-related cataract, left eye: Secondary | ICD-10-CM | POA: Diagnosis not present

## 2021-03-20 DIAGNOSIS — E1136 Type 2 diabetes mellitus with diabetic cataract: Secondary | ICD-10-CM | POA: Diagnosis not present

## 2021-03-20 DIAGNOSIS — I10 Essential (primary) hypertension: Secondary | ICD-10-CM | POA: Diagnosis not present

## 2021-03-20 DIAGNOSIS — Z961 Presence of intraocular lens: Secondary | ICD-10-CM | POA: Diagnosis not present

## 2021-03-20 DIAGNOSIS — Z8249 Family history of ischemic heart disease and other diseases of the circulatory system: Secondary | ICD-10-CM | POA: Diagnosis not present

## 2021-03-20 DIAGNOSIS — Z833 Family history of diabetes mellitus: Secondary | ICD-10-CM | POA: Diagnosis not present

## 2021-03-20 DIAGNOSIS — E785 Hyperlipidemia, unspecified: Secondary | ICD-10-CM | POA: Diagnosis not present

## 2021-03-20 DIAGNOSIS — H40003 Preglaucoma, unspecified, bilateral: Secondary | ICD-10-CM | POA: Diagnosis not present

## 2021-03-21 DIAGNOSIS — Z961 Presence of intraocular lens: Secondary | ICD-10-CM | POA: Diagnosis not present

## 2021-03-21 DIAGNOSIS — H52212 Irregular astigmatism, left eye: Secondary | ICD-10-CM | POA: Diagnosis not present

## 2021-03-21 LAB — HM DIABETES EYE EXAM

## 2021-03-26 DIAGNOSIS — B079 Viral wart, unspecified: Secondary | ICD-10-CM | POA: Diagnosis not present

## 2021-03-26 DIAGNOSIS — D485 Neoplasm of uncertain behavior of skin: Secondary | ICD-10-CM | POA: Diagnosis not present

## 2021-04-16 DIAGNOSIS — D485 Neoplasm of uncertain behavior of skin: Secondary | ICD-10-CM | POA: Diagnosis not present

## 2021-04-16 DIAGNOSIS — B079 Viral wart, unspecified: Secondary | ICD-10-CM | POA: Diagnosis not present

## 2021-05-23 ENCOUNTER — Telehealth: Payer: Self-pay | Admitting: *Deleted

## 2021-05-23 NOTE — Chronic Care Management (AMB) (Signed)
  Chronic Care Management   Note  05/23/2021 Name: Lindsey Fischer MRN: [id number] DOB: Apr 01, 1941  Lindsey Fischer is a 80 y.o. year old female who is a primary care patient of [name], PA-C. I reached out to Lindsey Fischer by phone today in response to a referral sent by [name]'s PCP  [name], PA-C     [name] was given information about Chronic Care Management services today including:  CCM service includes personalized support from designated clinical staff supervised by her physician, including individualized plan of care and coordination with other care providers 24/7 contact phone numbers for assistance for urgent and routine care needs. Service will only be billed when office clinical staff spend 20 minutes or more in a month to coordinate care. Only one practitioner may furnish and bill the service in a calendar month. The patient may stop CCM services at any time (effective at the end of the month) by phone call to the office staff. The patient will be responsible for cost sharing (co-pay) of up to 20% of the service fee (after annual deductible is met).  Patient agreed to services and verbal consent obtained.   Follow up plan: Telephone appointment with care management team member scheduled for:06/13/2021  Julian Hy, [place]  Direct Dial: [phone]

## 2021-05-23 NOTE — Chronic Care Management (AMB) (Signed)
  Chronic Care Management   Outreach Note  05/23/2021 Name: MADAY GUARINO MRN: [id number] DOB: 03/27/1941  [name] is a 80 y.o. year old female who is a primary care patient of [name], PA-C. I reached out to [name] by phone today in response to a referral sent by Ms. Beatris Si Barfoot's PCP [name], PA-C     An unsuccessful telephone outreach was attempted today. The patient was referred to the case management team for assistance with care management and care coordination.   Follow Up Plan: A HIPAA compliant phone message was left for the patient providing contact information and requesting a return call.  If patient returns call to provider office, please advise to call Embedded Care Management Care Guide Ragna Kramlich at [place], [place] [facility]  Direct Dial: [phone]

## 2021-05-25 DIAGNOSIS — H524 Presbyopia: Secondary | ICD-10-CM | POA: Diagnosis not present

## 2021-05-25 DIAGNOSIS — H52223 Regular astigmatism, bilateral: Secondary | ICD-10-CM | POA: Diagnosis not present

## 2021-06-05 ENCOUNTER — Ambulatory Visit (INDEPENDENT_AMBULATORY_CARE_PROVIDER_SITE_OTHER): Payer: Medicare HMO | Admitting: Physician Assistant

## 2021-06-05 ENCOUNTER — Other Ambulatory Visit: Payer: Self-pay

## 2021-06-05 ENCOUNTER — Encounter: Payer: Self-pay | Admitting: Physician Assistant

## 2021-06-05 VITALS — BP 142/64 | HR 69 | Ht 63.0 in | Wt 162.0 lb

## 2021-06-05 DIAGNOSIS — I10 Essential (primary) hypertension: Secondary | ICD-10-CM | POA: Diagnosis not present

## 2021-06-05 DIAGNOSIS — E1159 Type 2 diabetes mellitus with other circulatory complications: Secondary | ICD-10-CM

## 2021-06-05 DIAGNOSIS — J4520 Mild intermittent asthma, uncomplicated: Secondary | ICD-10-CM

## 2021-06-05 DIAGNOSIS — E118 Type 2 diabetes mellitus with unspecified complications: Secondary | ICD-10-CM

## 2021-06-05 DIAGNOSIS — E1122 Type 2 diabetes mellitus with diabetic chronic kidney disease: Secondary | ICD-10-CM | POA: Diagnosis not present

## 2021-06-05 DIAGNOSIS — N1831 Chronic kidney disease, stage 3a: Secondary | ICD-10-CM | POA: Diagnosis not present

## 2021-06-05 DIAGNOSIS — E785 Hyperlipidemia, unspecified: Secondary | ICD-10-CM

## 2021-06-05 DIAGNOSIS — I152 Hypertension secondary to endocrine disorders: Secondary | ICD-10-CM | POA: Diagnosis not present

## 2021-06-05 LAB — POCT GLYCOSYLATED HEMOGLOBIN (HGB A1C): HbA1c, POC (controlled diabetic range): 6.3 % (ref 0.0–7.0)

## 2021-06-05 MED ORDER — METFORMIN HCL 500 MG PO TABS: 500.0000 mg | ORAL_TABLET | Freq: Two times a day (BID) | ORAL | 0 refills | Status: DC

## 2021-06-05 MED ORDER — IPRATROPIUM-ALBUTEROL 0.5-2.5 (3) MG/3ML IN SOLN: 3.0000 mL | Freq: Four times a day (QID) | RESPIRATORY_TRACT | 0 refills | Status: DC | PRN

## 2021-06-05 MED ORDER — MONTELUKAST SODIUM 10 MG PO TABS: 10.0000 mg | ORAL_TABLET | Freq: Every day | ORAL | 3 refills | Status: DC

## 2021-06-05 MED ORDER — LISINOPRIL-HYDROCHLOROTHIAZIDE 20-25 MG PO TABS: 1.0000 | ORAL_TABLET | Freq: Every day | ORAL | 1 refills | Status: DC

## 2021-06-05 NOTE — Progress Notes (Addendum)
Subjective:    Patient ID: Lindsey Fischer, female    DOB: [birth year], 80 y.o.   MRN: [id number]  HPI Pt is a 80 yo female with T2DM, Asthma, HTN, CKD, HLD who presents to the clinic for 3 month medication follow up.   Patient is very compliant with her metformin but she is not checking her sugars.  She admits to eating more fruit over the past 3 months.  She denies any open sores or wounds.  She denies any hypoglycemic events.  She has been active around the house.  She denies any chest pain, palpitations, headache or vision changes.  She has felt like her asthma is a little worse.  She feels like she is having to use her albuterol inhaler more than usual.  It does help with symptoms.  She just feels tight in chest. She is on the wixella and has been for a while.    .. Active Ambulatory Problems    Diagnosis Date Noted  . Dyslipidemia 01/19/2010  . ESSENTIAL HYPERTENSION, BENIGN 03/27/2010  . Asthma 01/19/2010  . Metabolic syndrome X 67/67/2094  . Type 2 diabetes mellitus with complication, without long-term current use of insulin ([place]) 11/15/2012  . Umbilical hernia 70/96/2836  . Diverticulosis 12/20/2015  . Gastritis and gastroduodenitis 12/20/2015  . Dyslipidemia, goal LDL below 70 04/22/2016  . New daily persistent headache 10/22/2016  . Pure hypercholesterolemia 04/22/2017  . Elevated serum creatinine 04/23/2017  . Combined forms of age-related cataract of both eyes 05/27/2017  . 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 Posterior vitreous detachment of both eyes 05/27/2017  . CKD (chronic kidney disease) stage 3, GFR 30-59 ml/min (HCC) 06/03/2017  . Neck stiffness 08/20/2017  . Numbness 08/20/2017  . Chronic neck pain 01/16/2018  . 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 Cervical radiculopathy 01/29/2018  . Ingrown toenail of left foot 07/31/2018  . Nausea 07/02/2019  . BPPV (benign paroxysmal positional vertigo), right 02/18/2020  . Hypertriglyceridemia  02/18/2020  . Seborrheic dermatitis 05/22/2020  . Atopic dermatitis 08/28/2020  . Rib pain on left side 12/08/2020  . Age-related cataract of left eye 03/06/2021  . Hypertension associated with diabetes ([place]) 06/11/2021   Resolved Ambulatory Problems    Diagnosis Date Noted  . GRIEF REACTION, ACUTE 03/27/2010  . Impaired fasting glucose 03/27/2010  . Hypoxemia 05/22/2010  . Pneumonia 05/06/2011  . Epigastric abdominal tenderness with rebound tenderness 12/19/2015  . Non-intractable cyclical vomiting with nausea 12/19/2015  . Abdominal guarding 12/19/2015  . Diarrhea 12/19/2015  . Helicobacter pylori gastritis 12/20/2015  . Atypical chest pain 10/22/2016  . Neck muscle spasm 08/20/2017  . Other headache syndrome 08/20/2017  . Pneumonia of both lower lobes due to infectious organism 07/06/2019   Past Medical History:  Diagnosis Date  . Asthma   . Diabetes mellitus   . Hyperlipidemia   . Hypertension     Review of Systems See HPI.     Objective:   Physical Exam Vitals reviewed.  Constitutional:      Appearance: Normal appearance. She is obese.  HENT:     Head: Normocephalic.  Neck:     Vascular: No carotid bruit.  Cardiovascular:     Rate and Rhythm: Normal rate and regular rhythm.     Pulses: Normal pulses.     Heart sounds: Murmur heard.  Pulmonary:     Effort: Pulmonary effort is normal.     Breath sounds: Normal breath sounds.  Musculoskeletal:  Right lower leg: No edema.     Left lower leg: No edema.  Lymphadenopathy:     Cervical: No cervical adenopathy.  Neurological:     General: No focal deficit present.     Mental Status: She is alert and oriented to person, place, and time.  Psychiatric:        Mood and Affect: Mood normal.      .. Results for orders placed or performed in visit on 06/05/21  COMPLETE METABOLIC PANEL WITH GFR  Result Value Ref Range   Glucose, Bld 107 (H) 65 - 99 mg/dL   BUN 17 7 - 25 mg/dL   Creat 1.24 (H) 0.60 - 1.00  mg/dL   eGFR 44 (L) > OR = 60 mL/min/1.52m   BUN/Creatinine Ratio 14 6 - 22 (calc)   Sodium 138 135 - 146 mmol/L   Potassium 4.1 3.5 - 5.3 mmol/L   Chloride 100 98 - 110 mmol/L   CO2 30 20 - 32 mmol/L   Calcium 10.3 8.6 - 10.4 mg/dL   Total Protein 6.9 6.1 - 8.1 g/dL   Albumin 4.4 3.6 - 5.1 g/dL   Globulin 2.5 1.9 - 3.7 g/dL (calc)   AG Ratio 1.8 1.0 - 2.5 (calc)   Total Bilirubin 0.5 0.2 - 1.2 mg/dL   Alkaline phosphatase (APISO) 49 37 - 153 U/L   AST 17 10 - 35 U/L   ALT 11 6 - 29 U/L  Lipid Panel w/reflex Direct LDL  Result Value Ref Range   Cholesterol 186 <200 mg/dL   HDL 51 > OR = 50 mg/dL   Triglycerides 349 (H) <150 mg/dL   LDL Cholesterol (Calc) 88 mg/dL (calc)   Total CHOL/HDL Ratio 3.6 <5.0 (calc)   Non-HDL Cholesterol (Calc) 135 (H) <130 mg/dL (calc)  POCT glycosylated hemoglobin (Hb A1C)  Result Value Ref Range   Hemoglobin A1C     HbA1c POC (<> result, manual entry)     HbA1c, POC (prediabetic range)     HbA1c, POC (controlled diabetic range) 6.3 0.0 - 7.0 %       Assessment & Plan:  .Marland KitchenMarland KitchenAnwitawas seen today for diabetes.  Diagnoses and all orders for this visit:  Type 2 diabetes mellitus with stage 3a chronic kidney disease, without long-term current use of insulin (HCC) -     POCT glycosylated hemoglobin (Hb A1C) -     metFORMIN (GLUCOPHAGE) 500 MG tablet; Take 1 tablet (500 mg total) by mouth 2 (two) times daily with a meal.  Essential hypertension, benign -     COMPLETE METABOLIC PANEL WITH GFR -     lisinopril-hydrochlorothiazide (ZESTORETIC) 20-25 MG tablet; Take 1 tablet by mouth daily.  Dyslipidemia, goal LDL below 70 -     Lipid Panel w/reflex Direct LDL  Mild intermittent asthma without complication -     ipratropium-albuterol (DUONEB) 0.5-2.5 (3) MG/3ML SOLN; Take 3 mLs by nebulization every 6 (six) hours as needed. -     montelukast (SINGULAIR) 10 MG tablet; Take 1 tablet (10 mg total) by mouth at bedtime.  Hypertension associated with  diabetes ([facility]   A1c today is 6.  This is still controlled but up from last check.  Continue same medications. Blood pressure not to goal today.  She is on ACE.  She did just take her blood pressure medicine.  I do want her to start checking blood pressures at home.  Goal blood pressure under 130/80. On statin. Covid x2 need booster.  Pneumonia UTD.  For asthma. Added singulair before changing inhalers. Continue claritin daily and wixella inhaler.  Duoneb given to use as needed but NOT at the same time as albuterol inhaler.

## 2021-06-06 LAB — COMPLETE METABOLIC PANEL WITH GFR
AG Ratio: 1.8 (calc) (ref 1.0–2.5)
ALT: 11 U/L (ref 6–29)
AST: 17 U/L (ref 10–35)
Albumin: 4.4 g/dL (ref 3.6–5.1)
Alkaline phosphatase (APISO): 49 U/L (ref 37–153)
BUN/Creatinine Ratio: 14 (calc) (ref 6–22)
BUN: 17 mg/dL (ref 7–25)
CO2: 30 mmol/L (ref 20–32)
Calcium: 10.3 mg/dL (ref 8.6–10.4)
Chloride: 100 mmol/L (ref 98–110)
Creat: 1.24 mg/dL — ABNORMAL HIGH (ref 0.60–1.00)
Globulin: 2.5 g/dL (calc) (ref 1.9–3.7)
Glucose, Bld: 107 mg/dL — ABNORMAL HIGH (ref 65–99)
Potassium: 4.1 mmol/L (ref 3.5–5.3)
Sodium: 138 mmol/L (ref 135–146)
Total Bilirubin: 0.5 mg/dL (ref 0.2–1.2)
Total Protein: 6.9 g/dL (ref 6.1–8.1)
eGFR: 44 mL/min/{1.73_m2} — ABNORMAL LOW (ref >=60)

## 2021-06-06 LAB — LIPID PANEL W/REFLEX DIRECT LDL
Cholesterol: 186 mg/dL (ref <200)
HDL: 51 mg/dL (ref >=50)
LDL Cholesterol (Calc): 88 mg/dL (calc)
Non-HDL Cholesterol (Calc): 135 mg/dL (calc) — ABNORMAL HIGH (ref <130)
Total CHOL/HDL Ratio: 3.6 (calc) (ref <5.0)
Triglycerides: 349 mg/dL — ABNORMAL HIGH (ref <150)

## 2021-06-06 NOTE — Progress Notes (Signed)
Coral,   Your LDL is much better. Your TG are up from last check but that is likely because your sugars are up. Are you taking any fish oil? If not start '4000mg'$  a day.  Kidney function declined some. Make sure you are not taking any OTC anti-inflammatories and staying hydrated. Recheck kidney function in one month with lab only to make sure not trending down.

## 2021-06-11 ENCOUNTER — Encounter: Payer: Self-pay | Admitting: Physician Assistant

## 2021-06-11 DIAGNOSIS — E1159 Type 2 diabetes mellitus with other circulatory complications: Secondary | ICD-10-CM | POA: Insufficient documentation

## 2021-06-11 DIAGNOSIS — I152 Hypertension secondary to endocrine disorders: Secondary | ICD-10-CM | POA: Insufficient documentation

## 2021-06-11 DIAGNOSIS — I10 Essential (primary) hypertension: Secondary | ICD-10-CM | POA: Insufficient documentation

## 2021-06-13 ENCOUNTER — Telehealth: Payer: Self-pay | Admitting: *Deleted

## 2021-06-13 ENCOUNTER — Other Ambulatory Visit: Payer: Self-pay

## 2021-06-13 ENCOUNTER — Telehealth: Payer: Medicare HMO

## 2021-06-13 DIAGNOSIS — N289 Disorder of kidney and ureter, unspecified: Secondary | ICD-10-CM

## 2021-06-13 NOTE — Chronic Care Management (AMB) (Signed)
  Care Management   Note  06/13/2021 Name: Lindsey Fischer MRN: [id number] DOB: Apr 15, 1941  Lindsey Fischer is a 80 y.o. year old female who is a primary care patient of Lavada Mesi and is actively engaged with the care management team. I reached out to Lindsey Fischer by phone today to assist with re-scheduling an initial visit with the RN Case Manager  Follow up plan:  Provider schedule change - left message for return call to reschedule 06/20/21 appt  Unsuccessful telephone outreach attempt made. A HIPAA compliant phone message was left for the patient providing contact information and requesting a return call.   Julian Hy, [place] [facility]  Direct Dial: [phone]

## 2021-06-15 NOTE — Chronic Care Management (AMB) (Signed)
  Care Management   Note  06/15/2021 Name: Lindsey Fischer MRN: [id number] DOB: 04-12-1941  Lindsey Fischer is a 80 y.o. year old female who is a primary care patient of Lavada Mesi and is actively engaged with the care management team. I reached out to Lindsey Fischer by phone today to assist with re-scheduling an initial visit with the RN Case Manager  Follow up plan: Telephone appointment with care management team member scheduled for: 07/11/2021  Julian Hy, [place] [place] [facility]  Direct Dial: [phone]

## 2021-06-20 ENCOUNTER — Telehealth: Payer: Medicare HMO

## 2021-06-25 DIAGNOSIS — B079 Viral wart, unspecified: Secondary | ICD-10-CM | POA: Diagnosis not present

## 2021-07-11 ENCOUNTER — Ambulatory Visit (INDEPENDENT_AMBULATORY_CARE_PROVIDER_SITE_OTHER): Payer: Medicare HMO

## 2021-07-11 DIAGNOSIS — E118 Type 2 diabetes mellitus with unspecified complications: Secondary | ICD-10-CM

## 2021-07-11 DIAGNOSIS — J4520 Mild intermittent asthma, uncomplicated: Secondary | ICD-10-CM | POA: Diagnosis not present

## 2021-07-11 DIAGNOSIS — I10 Essential (primary) hypertension: Secondary | ICD-10-CM | POA: Diagnosis not present

## 2021-07-11 NOTE — Patient Instructions (Signed)
Visit Information   PATIENT GOALS:   Goals Addressed             This Visit's Progress    Monitor and Manage My Blood Sugar-Diabetes Type 2       Timeframe:  Long-Range Goal Priority:  Medium Start Date:  07/11/21                           Expected End Date: 01/08/21                      Follow Up Date 08/08/2021    Patient Goals: - check and record blood sugar as recommended by your provider and if you feel it is too high or too low - take the blood sugar log and glucose meter to all doctor visits - take medications as prescribed. - plan to eat healthy: lean meats, fruits, vegetables healthy fats. Monitor your salt intake. Limit sugars, rice, potatoes and pasta. - manage portion size - obtain Covid booster as recommended by your provider - review education: avoiding asthma triggers, diabetic meal planning, dietary fats. Plan to discuss at next telephone call. - continue to remain active        Consent to CCM Services: Lindsey Fischer was given information about Chronic Care Management services including:  CCM service includes personalized support from designated clinical staff supervised by her physician, including individualized plan of care and coordination with other care providers 24/7 contact phone numbers for assistance for urgent and routine care needs. Service will only be billed when office clinical staff spend 20 minutes or more in a month to coordinate care. Only one practitioner may furnish and bill the service in a calendar month. The patient may stop CCM services at any time (effective at the end of the month) by phone call to the office staff. The patient will be responsible for cost sharing (co-pay) of up to 20% of the service fee (after annual deductible is met).  Patient agreed to services and verbal consent obtained.   The patient verbalized understanding of instructions, educational materials, and care plan provided today and agreed to receive a mailed copy of  patient instructions, educational materials, and care plan.   Telephone follow up appointment with care management team member scheduled for: The patient has been provided with contact information for the care management team and has been advised to call with any health related questions or concerns.       CLINICAL CARE PLAN: Patient Care Plan: Diabetes Type 2 (Adult)     Problem Identified: Knowledge deficit related to long term care plan for self-management of diabetes in a patient with HTN, HDL, asthma, CKD   Priority: Medium     Long-Range Goal: Disease Progression Prevented or Minimized   Start Date: 07/11/2021  Expected End Date: 01/08/2022  Priority: Medium  Note:   Objective:  Lab Results  Component Value Date   HGBA1C 6.3 06/05/2021   Lab Results  Component Value Date   CREATININE 1.24 (H) 06/05/2021   CREATININE 1.02 (H) 12/07/2020   CREATININE 0.97 (H) 11/15/2019   Lab Results  Component Value Date   EGFR 44 (L) 06/05/2021  Last practice recorded BP readings:  BP Readings from Last 3 Encounters:  06/05/21 (!) 142/64  03/06/21 (!) 145/64  12/06/20 125/68  Most recent lipid panel:     Component Value Date/Time   CHOL 186 06/05/2021 0000   TRIG 349 (H)  06/05/2021 0000   HDL 51 06/05/2021 0000   CHOLHDL 3.6 06/05/2021 0000   VLDL 75 (H) 04/22/2017 0945   LDLCALC 88 06/05/2021 0000   LDLDIRECT 100 (H) 09/03/2011 1054  Current Barriers:  Knowledge Deficits related to long term care plan for self-management of diabetes in a patient with HTN, HLD, asthma. Patient reports she is doing well. She reports "I am trying to do better" in reference to diabetes management. She states, "I love pasta and fattening foods". Blood sugar this morning 117 ac and yesterday 135 ac. She checks blood pressure every other day-blood pressure range 120-135/70-83. She continues to be active. No shortness of breath. She states medications help keep asthma under control. Denies any problems,  questions or concerns at this time. Food choices Case Manager Clinical Goal(s):  patient will demonstrate improved adherence to prescribed treatment plan for diabetes self care/management as evidenced by: daily monitoring and recording of CBG, improved adherence to ADA/ carb modified diet, exercise 3 days/week, adherence to prescribed medication regimen contacting provider for new or worsened symptoms or questions Interventions:  Collaboration with [name], PA-C regarding development and update of comprehensive plan of care as evidenced by provider attestation and co-signature Inter-disciplinary care team collaboration (see longitudinal plan of care) Reviewed medications with patient and discussed importance of medication adherence Reviewed scheduled/upcoming provider appointments. Provided education on asthma, diabetes Discussed plans with patient for ongoing care management follow up and provided patient with direct contact information for care management team Self-Care Activities - Self administers oral medications as prescribed Attends all scheduled provider appointments Patient Goals: - check and record blood sugar as recommended by your provider and if you feel it is too high or too low - take the blood sugar log and glucose meter to all doctor visits - take medications as prescribed. - plan to eat healthy: lean meats, fruits, vegetables healthy fats. Monitor your salt intake. Limit sugars, rice, potatoes and pasta. - manage portion size - obtain Covid booster as recommended by your provider - review education: avoiding asthma triggers, diabetic meal planning, dietary fats. Plan to discuss at next telephone call. - continue to remain active Follow Up Plan: The patient has been provided with contact information for the care management team and has been advised to call with any health related questions or concerns.  The care management team will reach out to the patient again  over the next 30 days.

## 2021-07-13 NOTE — Chronic Care Management (AMB) (Signed)
Chronic Care Management   CCM RN Visit Note  07/13/2021 Name: Lindsey Fischer MRN: [id number] DOB: 1941-10-06  Subjective: Lindsey Fischer is a 80 y.o. year old female who is a primary care patient of [name], PA-C. The care management team was consulted for assistance with disease management and care coordination needs.    Engaged with patient by telephone for initial visit in response to provider referral for case management and/or care coordination services.   Consent to Services:  The patient was given the following information about Chronic Care Management services today, agreed to services, and gave verbal consent: 1. CCM service includes personalized support from designated clinical staff supervised by the primary care provider, including individualized plan of care and coordination with other care providers 2. 24/7 contact phone numbers for assistance for urgent and routine care needs. 3. Service will only be billed when office clinical staff spend 20 minutes or more in a month to coordinate care. 4. Only one practitioner may furnish and bill the service in a calendar month. 5.The patient may stop CCM services at any time (effective at the end of the month) by phone call to the office staff. 6. The patient will be responsible for cost sharing (co-pay) of up to 20% of the service fee (after annual deductible is met). Patient agreed to services and consent obtained.  Patient agreed to services and verbal consent obtained.   Assessment: Review of patient past medical history, allergies, medications, health status, including review of consultants reports, laboratory and other test data, was performed as part of comprehensive evaluation and provision of chronic care management services.   SDOH (Social Determinants of Health) assessments and interventions performed:  SDOH Interventions    Flowsheet Row Most Recent Value  SDOH Interventions   Food Insecurity Interventions Intervention  Not Indicated  Transportation Interventions Intervention Not Indicated        CCM Care Plan  Allergies  Allergen Reactions   Peanut-Containing Drug Products Shortness Of Breath    Restricted breathing   Livalo [Pitavastatin] Other (See Comments)    Muscle cramps   Mucinex [Guaifenesin Er] Other (See Comments)    Made blood sugar go up to 212.    Prednisone Other (See Comments)    Bloating and abdominal pain, with oral product only.   Zocor [Simvastatin] Other (See Comments)    Muscle cramps    Amlodipine Other (See Comments)    Dizziness    Tramadol     Somnolence.     Outpatient Encounter Medications as of 07/11/2021  Medication Sig Note   albuterol (VENTOLIN HFA) 108 (90 Base) MCG/ACT inhaler INHALE 2 PUFFS BY MOUTH ONCE DAILY AS DIRECTED    AMBULATORY NON FORMULARY MEDICATION 2 (two) times daily. Osteobiflex 07/11/2021: Reports takes once a day   Fluticasone-Salmeterol (WIXELA INHUB) 250-50 MCG/DOSE AEPB INHALE 1 PUFF INTO THE LUNGS EVERY 12 HOURS    ipratropium-albuterol (DUONEB) 0.5-2.5 (3) MG/3ML SOLN Take 3 mLs by nebulization every 6 (six) hours as needed.    lisinopril-hydrochlorothiazide (ZESTORETIC) 20-25 MG tablet Take 1 tablet by mouth daily.    metFORMIN (GLUCOPHAGE) 500 MG tablet Take 1 tablet (500 mg total) by mouth 2 (two) times daily with a meal.    montelukast (SINGULAIR) 10 MG tablet Take 1 tablet (10 mg total) by mouth at bedtime.    Omega-3 Fatty Acids (FISH OIL) 1200 MG CAPS Take 1 capsule by mouth daily.    rosuvastatin (CRESTOR) 20 MG tablet Take 1 tablet (20  mg total) by mouth daily.    Alcohol Swabs (B-D SINGLE USE SWABS REGULAR) PADS 1 application by Does not apply route daily as needed (to use with injections).    AMBULATORY NON FORMULARY MEDICATION Nebulizer machine and tubing  Dx:  COPD exacerbation    aspirin 500 MG tablet Take 500 mg by mouth every 6 (six) hours as needed for pain. (Patient not taking: Reported on 07/11/2021)    Blood Glucose  Monitoring Suppl (TRUE METRIX METER) w/Device KIT USE AS DIRECTED    triamcinolone cream (KENALOG) 0.1 % Apply 1 application topically 2 (two) times daily. (Patient not taking: Reported on 07/11/2021)    TRUE METRIX BLOOD GLUCOSE TEST test strip TEST BLOOD SUGAR TWICE DAILY AS NEEDED    TRUEplus Lancets 33G MISC TEST BLOOD SUGAR TWICE DAILY AS NEEDED    Facility-Administered Encounter Medications as of 07/11/2021  Medication   iopamidol (ISOVUE-300) 61 % injection 80 mL    Patient Active Problem List   Diagnosis Date Noted   Hypertension associated with diabetes ([place]) 06/11/2021   Age-related cataract of left eye 03/06/2021   Rib pain on left side 12/08/2020   Atopic dermatitis 08/28/2020   Seborrheic dermatitis 05/22/2020   BPPV (benign paroxysmal positional vertigo), right 02/18/2020   Hypertriglyceridemia 02/18/2020   Nausea 07/02/2019   Ingrown toenail of left foot 07/31/2018   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   Cervical radiculopathy 01/29/2018   Chronic neck pain 01/16/2018   Neck stiffness 08/20/2017   Numbness 08/20/2017   CKD (chronic kidney disease) stage 3, GFR 30-59 ml/min (HCC) 06/03/2017   Combined forms of age-related cataract of both eyes 05/27/2017   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   Posterior vitreous detachment of both eyes 05/27/2017   Elevated serum creatinine 04/23/2017   Pure hypercholesterolemia 04/22/2017   New daily persistent headache 10/22/2016   Dyslipidemia, goal LDL below 70 04/22/2016   Diverticulosis 12/20/2015   Gastritis and gastroduodenitis 31/59/4585   Umbilical hernia 92/92/4462   Type 2 diabetes mellitus with complication, without long-term current use of insulin ([place]) 86/38/1771   Metabolic syndrome X 16/57/9038   ESSENTIAL HYPERTENSION, BENIGN 03/27/2010   Dyslipidemia 01/19/2010   Asthma 01/19/2010    Conditions to be addressed/monitored:HTN, HLD, and Asthma  Care Plan : Diabetes Type 2 (Adult)  Updates  made by [name], RN since 07/13/2021 12:00 AM     Problem: Knowledge deficit related to long term care plan for self-management of diabetes in a patient with HTN, HDL, asthma, CKD   Priority: Medium     Long-Range Goal: Disease Progression Prevented or Minimized   Start Date: 07/11/2021  Expected End Date: 01/08/2022  Priority: Medium  Note:   Objective:  Lab Results  Component Value Date   HGBA1C 6.3 06/05/2021   Lab Results  Component Value Date   CREATININE 1.24 (H) 06/05/2021   CREATININE 1.02 (H) 12/07/2020   CREATININE 0.97 (H) 11/15/2019   Lab Results  Component Value Date   EGFR 44 (L) 06/05/2021  Last practice recorded BP readings:  BP Readings from Last 3 Encounters:  06/05/21 (!) 142/64  03/06/21 (!) 145/64  12/06/20 125/68  Most recent lipid panel:     Component Value Date/Time   CHOL 186 06/05/2021 0000   TRIG 349 (H) 06/05/2021 0000   HDL 51 06/05/2021 0000   CHOLHDL 3.6 06/05/2021 0000   VLDL 75 (H) 04/22/2017 0945   LDLCALC 88 06/05/2021 0000   LDLDIRECT 100 (H) 09/03/2011  1054  Current Barriers:  Knowledge Deficits related to long term care plan for self-management of diabetes in a patient with HTN, HLD, asthma. Patient reports she is doing well. She reports "I am trying to do better" in reference to diabetes management. She states, "I love pasta and fattening foods". Blood sugar this morning 117 ac and yesterday 135 ac. She checks blood pressure every other day-blood pressure range 120-135/70-83. She continues to be active. No shortness of breath. She states medications help keep asthma under control. Denies any problems, questions or concerns at this time. Food choices Case Manager Clinical Goal(s):  patient will demonstrate improved adherence to prescribed treatment plan for diabetes self care/management as evidenced by: daily monitoring and recording of CBG, improved adherence to ADA/ carb modified diet, exercise 3 days/week, adherence to  prescribed medication regimen contacting provider for new or worsened symptoms or questions Interventions:  Collaboration with [name], PA-C regarding development and update of comprehensive plan of care as evidenced by provider attestation and co-signature Inter-disciplinary care team collaboration (see longitudinal plan of care) Reviewed medications with patient and discussed importance of medication adherence Reviewed scheduled/upcoming provider appointments. Provided education on asthma, diabetes Discussed plans with patient for ongoing care management follow up and provided patient with direct contact information for care management team Self-Care Activities - Self administers oral medications as prescribed Attends all scheduled provider appointments Patient Goals: - check and record blood sugar as recommended by your provider and if you feel it is too high or too low - take the blood sugar log and glucose meter to all doctor visits - take medications as prescribed. - plan to eat healthy: lean meats, fruits, vegetables healthy fats. Monitor your salt intake. Limit sugars, rice, potatoes and pasta. - manage portion size - obtain Covid booster as recommended by your provider - review education: avoiding asthma triggers, diabetic meal planning, dietary fats. Plan to discuss at next telephone call. - continue to remain active Follow Up Plan: The patient has been provided with contact information for the care management team and has been advised to call with any health related questions or concerns.  The care management team will reach out to the patient again over the next 30 days.     Plan:The patient has been provided with contact information for the care management team and has been advised to call with any health related questions or concerns.  and The care management team will reach out to the patient again over the next 30 days.  Thea Silversmith, RN, MSN, BSN, CCM Care  Management Coordinator [facility] [phone]

## 2021-07-23 DIAGNOSIS — B079 Viral wart, unspecified: Secondary | ICD-10-CM | POA: Diagnosis not present

## 2021-08-08 ENCOUNTER — Telehealth: Payer: Medicare HMO

## 2021-08-13 ENCOUNTER — Telehealth: Payer: Self-pay | Admitting: *Deleted

## 2021-08-13 NOTE — Chronic Care Management (AMB) (Signed)
  Care Management   Note  08/13/2021 Name: Lindsey Fischer MRN: [id number] DOB: 15-Jun-1941  [name] is a 80 y.o. year old female who is a primary care patient of Lavada Mesi and is actively engaged with the care management team. I reached out to [name] by phone today to assist with re-scheduling a follow up visit with the RN Case Manager  Follow up plan: Telephone appointment with care management team member scheduled for: 330 PM on 08/15/21  [place], [place] Management  Direct Dial: [phone]

## 2021-08-13 NOTE — Chronic Care Management (AMB) (Signed)
  Care Management   Note  08/13/2021 Name: LYNISHA OSUCH MRN: [id number] DOB: Nov 07, 1941  [name] is a 80 y.o. year old female who is a primary care patient of Lavada Mesi and is actively engaged with the care management team. I reached out to [name] by phone today to assist with re-scheduling a follow up visit with the RN Case Manager  Follow up plan: Unsuccessful telephone outreach attempt made. A HIPAA compliant phone message was left for the patient providing contact information and requesting a return call.  The care management team will reach out to the patient again over the next 7 days.  If patient returns call to provider office, please advise to call [place] at [phone].  [place] Management  Direct Dial: [phone]

## 2021-08-15 ENCOUNTER — Ambulatory Visit (INDEPENDENT_AMBULATORY_CARE_PROVIDER_SITE_OTHER): Payer: Medicare HMO

## 2021-08-15 ENCOUNTER — Telehealth: Payer: Medicare HMO

## 2021-08-15 DIAGNOSIS — J4520 Mild intermittent asthma, uncomplicated: Secondary | ICD-10-CM

## 2021-08-15 DIAGNOSIS — E118 Type 2 diabetes mellitus with unspecified complications: Secondary | ICD-10-CM

## 2021-08-15 DIAGNOSIS — I152 Hypertension secondary to endocrine disorders: Secondary | ICD-10-CM

## 2021-08-15 DIAGNOSIS — E1159 Type 2 diabetes mellitus with other circulatory complications: Secondary | ICD-10-CM

## 2021-08-15 NOTE — Chronic Care Management (AMB) (Signed)
Chronic Care Management   CCM RN Visit Note  08/15/2021 Name: Lindsey Fischer MRN: [id number] DOB: [birth year]  Subjective: Lindsey Fischer is a 80 y.o. year old female who is a primary care patient of [name], PA-C. The care management team was consulted for assistance with disease management and care coordination needs.    Engaged with patient by telephone for follow up visit in response to provider referral for case management and/or care coordination services.   Consent to Services:  The patient was given information about Chronic Care Management services, agreed to services, and gave verbal consent prior to initiation of services.  Please see initial visit note for detailed documentation.   Patient agreed to services and verbal consent obtained.   Assessment: Review of patient past medical history, allergies, medications, health status, including review of consultants reports, laboratory and other test data, was performed as part of comprehensive evaluation and provision of chronic care management services.   SDOH (Social Determinants of Health) assessments and interventions performed:    CCM Care Plan  Allergies  Allergen Reactions   Peanut-Containing Drug Products Shortness Of Breath    Restricted breathing   Livalo [Pitavastatin] Other (See Comments)    Muscle cramps   Mucinex [Guaifenesin Er] Other (See Comments)    Made blood sugar go up to 212.    Prednisone Other (See Comments)    Bloating and abdominal pain, with oral product only.   Zocor [Simvastatin] Other (See Comments)    Muscle cramps    Amlodipine Other (See Comments)    Dizziness    Tramadol     Somnolence.     Outpatient Encounter Medications as of 08/15/2021  Medication Sig Note   albuterol (VENTOLIN HFA) 108 (90 Base) MCG/ACT inhaler INHALE 2 PUFFS BY MOUTH ONCE DAILY AS DIRECTED    AMBULATORY NON FORMULARY MEDICATION 2 (two) times daily. Osteobiflex 07/11/2021: Reports takes once a day    Fluticasone-Salmeterol (WIXELA INHUB) 250-50 MCG/DOSE AEPB INHALE 1 PUFF INTO THE LUNGS EVERY 12 HOURS    ipratropium-albuterol (DUONEB) 0.5-2.5 (3) MG/3ML SOLN Take 3 mLs by nebulization every 6 (six) hours as needed.    lisinopril-hydrochlorothiazide (ZESTORETIC) 20-25 MG tablet Take 1 tablet by mouth daily.    metFORMIN (GLUCOPHAGE) 500 MG tablet Take 1 tablet (500 mg total) by mouth 2 (two) times daily with a meal.    montelukast (SINGULAIR) 10 MG tablet Take 1 tablet (10 mg total) by mouth at bedtime.    Omega-3 Fatty Acids (FISH OIL) 1200 MG CAPS Take 1 capsule by mouth daily.    rosuvastatin (CRESTOR) 20 MG tablet Take 1 tablet (20 mg total) by mouth daily.    Alcohol Swabs (B-D SINGLE USE SWABS REGULAR) PADS 1 application by Does not apply route daily as needed (to use with injections).    AMBULATORY NON FORMULARY MEDICATION Nebulizer machine and tubing  Dx:  COPD exacerbation    aspirin 500 MG tablet Take 500 mg by mouth every 6 (six) hours as needed for pain. (Patient not taking: No sig reported)    Blood Glucose Monitoring Suppl (TRUE METRIX METER) w/Device KIT USE AS DIRECTED    triamcinolone cream (KENALOG) 0.1 % Apply 1 application topically 2 (two) times daily. (Patient not taking: No sig reported)    TRUE METRIX BLOOD GLUCOSE TEST test strip TEST BLOOD SUGAR TWICE DAILY AS NEEDED    TRUEplus Lancets 33G MISC TEST BLOOD SUGAR TWICE DAILY AS NEEDED    Facility-Administered Encounter Medications as  of 08/15/2021  Medication   iopamidol (ISOVUE-300) 61 % injection 80 mL    Patient Active Problem List   Diagnosis Date Noted   Hypertension associated with diabetes ([place]) 06/11/2021   Age-related cataract of left eye 03/06/2021   Rib pain on left side 12/08/2020   Atopic dermatitis 08/28/2020   Seborrheic dermatitis 05/22/2020   BPPV (benign paroxysmal positional vertigo), right 02/18/2020   Hypertriglyceridemia 02/18/2020   Nausea 07/02/2019   Ingrown toenail of left foot  07/31/2018   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   Cervical radiculopathy 01/29/2018   Chronic neck pain 01/16/2018   Neck stiffness 08/20/2017   Numbness 08/20/2017   CKD (chronic kidney disease) stage 3, GFR 30-59 ml/min (HCC) 06/03/2017   Combined forms of age-related cataract of both eyes 05/27/2017   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   Posterior vitreous detachment of both eyes 05/27/2017   Elevated serum creatinine 04/23/2017   Pure hypercholesterolemia 04/22/2017   New daily persistent headache 10/22/2016   Dyslipidemia, goal LDL below 70 04/22/2016   Diverticulosis 12/20/2015   Gastritis and gastroduodenitis 56/31/4970   Umbilical hernia 26/37/8588   Type 2 diabetes mellitus with complication, without long-term current use of insulin ([place]) 50/27/7412   Metabolic syndrome X 87/86/7672   ESSENTIAL HYPERTENSION, BENIGN 03/27/2010   Dyslipidemia 01/19/2010   Asthma 01/19/2010    Conditions to be addressed/monitored:HTN and DMII  Care Plan : Diabetes Type 2 (Adult)  Updates made by [name], RN since 08/15/2021 12:00 AM     Problem: Knowledge deficit related to long term care plan for self-management of diabetes in a patient with HTN, HDL, asthma, CKD   Priority: Medium     Long-Range Goal: Disease Progression Prevented or Minimized   Start Date: 07/11/2021  Expected End Date: 01/08/2022  This Visit's Progress: On track  Priority: Medium  Note:   Objective:  Lab Results  Component Value Date   HGBA1C 6.3 06/05/2021   Lab Results  Component Value Date   CREATININE 1.24 (H) 06/05/2021   CREATININE 1.02 (H) 12/07/2020   CREATININE 0.97 (H) 11/15/2019   Lab Results  Component Value Date   EGFR 44 (L) 06/05/2021  Last practice recorded BP readings:  BP Readings from Last 3 Encounters:  06/05/21 (!) 142/64  03/06/21 (!) 145/64  12/06/20 125/68  Most recent lipid panel:     Component Value Date/Time   CHOL 186 06/05/2021 0000   TRIG  349 (H) 06/05/2021 0000   HDL 51 06/05/2021 0000   CHOLHDL 3.6 06/05/2021 0000   VLDL 75 (H) 04/22/2017 0945   LDLCALC 88 06/05/2021 0000   LDLDIRECT 100 (H) 09/03/2011 1054  Current Barriers:  Knowledge Deficits related to long term care plan for self-management of diabetes in a patient with HTN, HLD, asthma. Reports doing good. Patient had a birthday yesterday. States she treated herself to a hot dog and fries. Blood sugar last night was 118 and blood sugar this morning was 138. Blood pressure today 128/62 HR 80 today. She state she is still cutting grass with riding lawnmower. She remains active. She denies any difficulty breathing and reports medications are helping to control her conditions. No questions or concerns at this time. Food choices Case Manager Clinical Goal(s):  patient will demonstrate improved adherence to prescribed treatment plan for diabetes self care/management as evidenced by: daily monitoring and recording of CBG, improved adherence to ADA/ carb modified diet, exercise 3 days/week, adherence to prescribed medication regimen contacting provider  for new or worsened symptoms or questions Interventions:  Collaboration with [name], PA-C regarding development and update of comprehensive plan of care as evidenced by provider attestation and co-signature Inter-disciplinary care team collaboration (see longitudinal plan of care) Reviewed medications with patient. Discussed increased triglycerides and discussed foods that can help lower triglycerides. Discussed previous education material provided. Reviewed scheduled/upcoming provider appointments. Discussed plans with patient for ongoing care management follow up and provided patient with direct contact information for care management team Self-Care Activities - Self administers oral medications as prescribed Attends all scheduled provider appointments Patient Goals: - continue to attend provider appointments as  scheduled. Next appointment with primary care provider scheduled for 09/05/21. - continue to check and record blood sugar as recommended by your provider and if you feel it is too high or too low - take the blood sugar log and glucose meter to all doctor visits - continue to take medications as prescribed. - continue to eat healthy: lean meats, fruits, vegetables healthy fats; low carbohydrate-limit sugars, rice, potatoes and pasta; food high in fiber that include oily fish. Monitor your salt intake. - manage portion size - continue to remain active - continue to work with care management team for ongoing care coordination and care management needs. Follow Up Plan: The patient has been provided with contact information for the care management team and has been advised to call with any health related questions or concerns.  The care management team will reach out to the patient again next month.    Plan:Telephone follow up appointment with care management team member scheduled for:  next month and The patient has been provided with contact information for the care management team and has been advised to call with any health related questions or concerns.   Thea Silversmith, RN, MSN, BSN, CCM Care Management Coordinator [facility] [phone]

## 2021-08-15 NOTE — Patient Instructions (Addendum)
Visit Information:  PATIENT GOALS:  Goals Addressed             This Visit's Progress    Monitor and Manage My Blood Sugar-Diabetes Type 2   On track    Timeframe:  Long-Range Goal Priority:  Medium Start Date:  07/11/21                           Expected End Date: 01/08/21                      Follow Up Date 09/12/21    Patient Goals: - continue to attend provider appointments as scheduled. Next appointment with primary care provider scheduled for 09/05/21. - continue to check and record blood sugar as recommended by your provider and if you feel it is too high or too low - take the blood sugar log and glucose meter to all doctor visits - continue to take medications as prescribed. - continue to eat healthy: lean meats, fruits, vegetables healthy fats; low carbohydrate-limit sugars, rice, potatoes and pasta; food high in fiber that include oily fish. Monitor your salt intake. - manage portion size - continue to remain active - continue to work with care management team for ongoing care coordination and care management needs.        The patient verbalized understanding of instructions, educational materials, and care plan provided today and agreed to receive a mailed copy of patient instructions, educational materials, and care plan.   Telephone follow up appointment with care management team member scheduled for: 09/12/21 The patient has been provided with contact information for the care management team and has been advised to call with any health related questions or concerns.   Thea Silversmith, RN, MSN, BSN, CCM Care Management Coordinator [facility] [phone]

## 2021-08-20 DIAGNOSIS — B079 Viral wart, unspecified: Secondary | ICD-10-CM | POA: Diagnosis not present

## 2021-09-05 ENCOUNTER — Encounter: Payer: Self-pay | Admitting: Physician Assistant

## 2021-09-05 ENCOUNTER — Ambulatory Visit (INDEPENDENT_AMBULATORY_CARE_PROVIDER_SITE_OTHER): Payer: Medicare HMO | Admitting: Physician Assistant

## 2021-09-05 VITALS — BP 128/72 | HR 75 | Ht 63.0 in | Wt 161.0 lb

## 2021-09-05 DIAGNOSIS — Z23 Encounter for immunization: Secondary | ICD-10-CM | POA: Diagnosis not present

## 2021-09-05 DIAGNOSIS — E785 Hyperlipidemia, unspecified: Secondary | ICD-10-CM | POA: Diagnosis not present

## 2021-09-05 DIAGNOSIS — I10 Essential (primary) hypertension: Secondary | ICD-10-CM | POA: Diagnosis not present

## 2021-09-05 DIAGNOSIS — N1831 Chronic kidney disease, stage 3a: Secondary | ICD-10-CM

## 2021-09-05 DIAGNOSIS — J4520 Mild intermittent asthma, uncomplicated: Secondary | ICD-10-CM

## 2021-09-05 DIAGNOSIS — E1122 Type 2 diabetes mellitus with diabetic chronic kidney disease: Secondary | ICD-10-CM

## 2021-09-05 LAB — POCT GLYCOSYLATED HEMOGLOBIN (HGB A1C): Hemoglobin A1C: 6.5 % — AB (ref 4.0–5.6)

## 2021-09-05 MED ORDER — METFORMIN HCL 500 MG PO TABS: 500.0000 mg | ORAL_TABLET | Freq: Two times a day (BID) | ORAL | 1 refills | Status: DC

## 2021-09-05 MED ORDER — FLUTICASONE-SALMETEROL 250-50 MCG/ACT IN AEPB: 1.0000 | INHALATION_SPRAY | Freq: Two times a day (BID) | RESPIRATORY_TRACT | 3 refills | Status: DC

## 2021-09-05 NOTE — Progress Notes (Signed)
Subjective:    Patient ID: Lindsey Fischer, female    DOB: [birth year], 80 y.o.   MRN: [id number]  HPI Pt is a 80 yo female with T2DM, CKD 3, HTN, HLD, Asthma who presents to the clinic for 3 month follow up.   Pt is doing great. She is not checking sugars. She denies any CP, palpitations, headaches or vision changes. She is walking more and eating better.   Breathing is doing great. No wheezing or SOB. [place].  .. Active Ambulatory Problems    Diagnosis Date Noted   Dyslipidemia 01/19/2010   ESSENTIAL HYPERTENSION, BENIGN 03/27/2010   Asthma 78/58/8502   Metabolic syndrome X 77/41/2878   Type 2 diabetes mellitus with complication, without long-term current use of insulin ([place]) 67/67/2094   Umbilical hernia 70/96/2836   Diverticulosis 12/20/2015   Gastritis and gastroduodenitis 12/20/2015   Dyslipidemia, goal LDL below 70 04/22/2016   New daily persistent headache 10/22/2016   Pure hypercholesterolemia 04/22/2017   Elevated serum creatinine 04/23/2017   Combined forms of age-related cataract of both eyes 05/27/2017   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   Posterior vitreous detachment of both eyes 05/27/2017   CKD (chronic kidney disease) stage 3, GFR 30-59 ml/min (HCC) 06/03/2017   Neck stiffness 08/20/2017   Numbness 08/20/2017   Chronic neck pain 01/16/2018   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   Cervical radiculopathy 01/29/2018   Ingrown toenail of left foot 07/31/2018   Nausea 07/02/2019   BPPV (benign paroxysmal positional vertigo), right 02/18/2020   Hypertriglyceridemia 02/18/2020   Seborrheic dermatitis 05/22/2020   Atopic dermatitis 08/28/2020   Rib pain on left side 12/08/2020   Age-related cataract of left eye 03/06/2021   Hypertension associated with diabetes ([place]) 06/11/2021   Resolved Ambulatory Problems    Diagnosis Date Noted   GRIEF REACTION, ACUTE 03/27/2010   Impaired fasting glucose 03/27/2010   Hypoxemia 05/22/2010    Pneumonia 05/06/2011   Epigastric abdominal tenderness with rebound tenderness 12/19/2015   Non-intractable cyclical vomiting with nausea 12/19/2015   Abdominal guarding 12/19/2015   Diarrhea 62/94/7654   Helicobacter pylori gastritis 12/20/2015   Atypical chest pain 10/22/2016   Neck muscle spasm 08/20/2017   Other headache syndrome 08/20/2017   Pneumonia of both lower lobes due to infectious organism 07/06/2019   Past Medical History:  Diagnosis Date   Asthma    Diabetes mellitus    Hyperlipidemia    Hypertension      Review of Systems  All other systems reviewed and are negative.     Objective:   Physical Exam Vitals reviewed.  Constitutional:      Appearance: Normal appearance. She is obese.  HENT:     Head: Normocephalic.  Neck:     Vascular: No carotid bruit.  Cardiovascular:     Rate and Rhythm: Normal rate and regular rhythm.     Pulses: Normal pulses.     Heart sounds: Murmur heard.  Pulmonary:     Effort: Pulmonary effort is normal.     Breath sounds: Normal breath sounds.  Musculoskeletal:     Right lower leg: No edema.     Left lower leg: No edema.  Neurological:     General: No focal deficit present.     Mental Status: She is alert and oriented to person, place, and time.  Psychiatric:        Mood and Affect: Mood normal.    .. Results for orders placed or performed in  visit on 09/05/21  POCT glycosylated hemoglobin (Hb A1C)  Result Value Ref Range   Hemoglobin A1C 6.5 (A) 4.0 - 5.6 %   HbA1c POC (<> result, manual entry)     HbA1c, POC (prediabetic range)     HbA1c, POC (controlled diabetic range)           Assessment & Plan:  Marland KitchenMarland KitchenRamata was seen today for diabetes.  Diagnoses and all orders for this visit:  Type 2 diabetes mellitus with stage 3a chronic kidney disease, without long-term current use of insulin (HCC) -     POCT glycosylated hemoglobin (Hb A1C) -     metFORMIN (GLUCOPHAGE) 500 MG tablet; Take 1 tablet (500 mg total) by  mouth 2 (two) times daily with a meal.  Flu vaccine need -     Flu Vaccine QUAD High Dose(Fluad)  Essential hypertension, benign  Mild intermittent asthma without complication -     fluticasone-salmeterol (WIXELA INHUB) 250-50 MCG/ACT AEPB; Inhale 1 puff into the lungs in the morning and at bedtime.  Dyslipidemia, goal LDL below 70  A1C to goal.  Stay on same medications.  BP to goal.  On statin.  Eye exam and foot exam. UTD.  Covid x2. Needs booster.  Flu shot given.  Pneumonia vaccine UTD.  Follow up in 6 months.   Refilled wixella for asthma.

## 2021-09-10 DIAGNOSIS — J4520 Mild intermittent asthma, uncomplicated: Secondary | ICD-10-CM

## 2021-09-10 DIAGNOSIS — I152 Hypertension secondary to endocrine disorders: Secondary | ICD-10-CM

## 2021-09-10 DIAGNOSIS — E1159 Type 2 diabetes mellitus with other circulatory complications: Secondary | ICD-10-CM

## 2021-09-10 DIAGNOSIS — E118 Type 2 diabetes mellitus with unspecified complications: Secondary | ICD-10-CM | POA: Diagnosis not present

## 2021-09-12 ENCOUNTER — Ambulatory Visit (INDEPENDENT_AMBULATORY_CARE_PROVIDER_SITE_OTHER): Payer: Medicare HMO

## 2021-09-12 DIAGNOSIS — I10 Essential (primary) hypertension: Secondary | ICD-10-CM

## 2021-09-12 DIAGNOSIS — N1831 Chronic kidney disease, stage 3a: Secondary | ICD-10-CM

## 2021-09-12 DIAGNOSIS — E1122 Type 2 diabetes mellitus with diabetic chronic kidney disease: Secondary | ICD-10-CM

## 2021-09-12 NOTE — Patient Instructions (Addendum)
Visit Information: Thank you for taking the time to speak with me today.   Goals Addressed             This Visit's Progress    Monitor and Manage My Blood Sugar-Diabetes Type 2   On track    Timeframe:  Long-Range Goal Priority:  Medium Start Date:  07/11/21                           Expected End Date: 01/08/21                      Follow Up Date 10/17/21    Patient Goals: - continue to attend provider appointments as scheduled.  - continue to check and record blood sugar as recommended by your provider and if you feel it is too high or too low - take the blood sugar log and glucose meter to all doctor visits - continue to take medications as prescribed. - Continue to monitor your salt intake and select healthy food choices during the Holiday season. - continue to eat healthy: lean meats, fruits, vegetables healthy fats; low carbohydrate-limit sugars, rice, potatoes and pasta; food high in fiber that include oily fish.  - foods that can lower triglycerides-fatty fish, quinoa, avocado, coconut oil, garlic and cruciferous vegetables. - continue to remain active - continue to work with care management team for ongoing care coordination and care management needs.       The patient verbalized understanding of instructions, educational materials, and care plan provided today and agreed to receive a mailed copy of patient instructions, educational materials, and care plan.   Telephone follow up appointment with care management team member scheduled for: 10/17/21 The patient has been provided with contact information for the care management team and has been advised to call with any health related questions or concerns.   Thea Silversmith, RN, MSN, BSN, CCM Care Management Coordinator [facility] [phone]

## 2021-09-12 NOTE — Chronic Care Management (AMB) (Signed)
Chronic Care Management   CCM RN Visit Note  09/12/2021 Name: Lindsey Fischer MRN: [id number] DOB: 1940-11-18  Subjective: Lindsey Fischer is a 80 y.o. year old female who is a primary care patient of [name], PA-C. The care management team was consulted for assistance with disease management and care coordination needs.    Engaged with patient by telephone for follow up visit in response to provider referral for case management and/or care coordination services.   Consent to Services:  The patient was given information about Chronic Care Management services, agreed to services, and gave verbal consent prior to initiation of services.  Please see initial visit note for detailed documentation.   Patient agreed to services and verbal consent obtained.   Assessment: Review of patient past medical history, allergies, medications, health status, including review of consultants reports, laboratory and other test data, was performed as part of comprehensive evaluation and provision of chronic care management services.   SDOH (Social Determinants of Health) assessments and interventions performed:    CCM Care Plan  Allergies  Allergen Reactions   Peanut-Containing Drug Products Shortness Of Breath    Restricted breathing   Livalo [Pitavastatin] Other (See Comments)    Muscle cramps   Mucinex [Guaifenesin Er] Other (See Comments)    Made blood sugar go up to 212.    Prednisone Other (See Comments)    Bloating and abdominal pain, with oral product only.   Zocor [Simvastatin] Other (See Comments)    Muscle cramps    Amlodipine Other (See Comments)    Dizziness    Tramadol     Somnolence.     Outpatient Encounter Medications as of 09/12/2021  Medication Sig Note   albuterol (VENTOLIN HFA) 108 (90 Base) MCG/ACT inhaler INHALE 2 PUFFS BY MOUTH ONCE DAILY AS DIRECTED    AMBULATORY NON FORMULARY MEDICATION Nebulizer machine and tubing  Dx:  COPD exacerbation    AMBULATORY NON  FORMULARY MEDICATION 2 (two) times daily. Osteobiflex 07/11/2021: Reports takes once a day   aspirin 500 MG tablet Take 500 mg by mouth every 6 (six) hours as needed for pain.    fluticasone-salmeterol (WIXELA INHUB) 250-50 MCG/ACT AEPB Inhale 1 puff into the lungs in the morning and at bedtime.    ipratropium-albuterol (DUONEB) 0.5-2.5 (3) MG/3ML SOLN Take 3 mLs by nebulization every 6 (six) hours as needed.    lisinopril-hydrochlorothiazide (ZESTORETIC) 20-25 MG tablet Take 1 tablet by mouth daily.    metFORMIN (GLUCOPHAGE) 500 MG tablet Take 1 tablet (500 mg total) by mouth 2 (two) times daily with a meal.    montelukast (SINGULAIR) 10 MG tablet Take 1 tablet (10 mg total) by mouth at bedtime.    Omega-3 Fatty Acids (FISH OIL) 1200 MG CAPS Take 1 capsule by mouth daily. 09/12/2021: 2400 mg fish oil capsule. Once a day.   rosuvastatin (CRESTOR) 20 MG tablet Take 1 tablet (20 mg total) by mouth daily.    triamcinolone cream (KENALOG) 0.1 % Apply 1 application topically 2 (two) times daily.    Alcohol Swabs (B-D SINGLE USE SWABS REGULAR) PADS 1 application by Does not apply route daily as needed (to use with injections).    Blood Glucose Monitoring Suppl (TRUE METRIX METER) w/Device KIT USE AS DIRECTED    Fluticasone-Salmeterol (WIXELA INHUB) 250-50 MCG/DOSE AEPB INHALE 1 PUFF INTO THE LUNGS EVERY 12 HOURS    TRUE METRIX BLOOD GLUCOSE TEST test strip TEST BLOOD SUGAR TWICE DAILY AS NEEDED    TRUEplus  Lancets 33G MISC TEST BLOOD SUGAR TWICE DAILY AS NEEDED    Facility-Administered Encounter Medications as of 09/12/2021  Medication   iopamidol (ISOVUE-300) 61 % injection 80 mL    Patient Active Problem List   Diagnosis Date Noted   Hypertension associated with diabetes ([place]) 06/11/2021   Age-related cataract of left eye 03/06/2021   Rib pain on left side 12/08/2020   Atopic dermatitis 08/28/2020   Seborrheic dermatitis 05/22/2020   BPPV (benign paroxysmal positional vertigo), right 02/18/2020    Hypertriglyceridemia 02/18/2020   Nausea 07/02/2019   Ingrown toenail of left foot 07/31/2018   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   Cervical radiculopathy 01/29/2018   Chronic neck pain 01/16/2018   Neck stiffness 08/20/2017   Numbness 08/20/2017   CKD (chronic kidney disease) stage 3, GFR 30-59 ml/min (HCC) 06/03/2017   Combined forms of age-related cataract of both eyes 05/27/2017   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   Posterior vitreous detachment of both eyes 05/27/2017   Elevated serum creatinine 04/23/2017   Pure hypercholesterolemia 04/22/2017   New daily persistent headache 10/22/2016   Dyslipidemia, goal LDL below 70 04/22/2016   Diverticulosis 12/20/2015   Gastritis and gastroduodenitis 81/85/6314   Umbilical hernia 97/12/6376   Type 2 diabetes mellitus with complication, without long-term current use of insulin ([place]) 58/85/0277   Metabolic syndrome X 41/28/7867   ESSENTIAL HYPERTENSION, BENIGN 03/27/2010   Dyslipidemia 01/19/2010   Asthma 01/19/2010    Conditions to be addressed/monitored:HTN, HLD, and Asthma  Care Plan : Diabetes Type 2 (Adult)  Updates made by [name], RN since 09/12/2021 12:00 AM     Problem: Knowledge deficit related to long term care plan for self-management of diabetes in a patient with HTN, HDL, asthma, CKD   Priority: Medium     Long-Range Goal: Disease Progression Prevented or Minimized   Start Date: 07/11/2021  Expected End Date: 01/08/2022  This Visit's Progress: On track  Recent Progress: On track  Priority: Medium  Note:   Objective:  Lab Results  Component Value Date   HGBA1C 6.5 (A) 09/05/2021   Lab Results  Component Value Date   CREATININE 1.24 (H) 06/05/2021   CREATININE 1.02 (H) 12/07/2020   CREATININE 0.97 (H) 11/15/2019   Lab Results  Component Value Date   EGFR 44 (L) 06/05/2021  Last practice recorded BP readings:  BP Readings from Last 3 Encounters:  09/05/21 128/72  06/05/21  (!) 142/64  03/06/21 (!) 145/64  Most recent lipid panel:     Component Value Date/Time   CHOL 186 06/05/2021 0000   TRIG 349 (H) 06/05/2021 0000   HDL 51 06/05/2021 0000   CHOLHDL 3.6 06/05/2021 0000   VLDL 75 (H) 04/22/2017 0945   LDLCALC 88 06/05/2021 0000   LDLDIRECT 100 (H) 09/03/2011 1054  Current Barriers:  Knowledge Deficits related to long term care plan for self-management of diabetes in a patient with HTN, HLD, asthma. Lindsey Fischer is in good spirits. Last office visit 08/26/21 with PCP. She continues to take medications as recommended. Denies any questions or concerns at this time. Food choices Case Manager Clinical Goal(s):  patient will demonstrate improved adherence to prescribed treatment plan for diabetes self care/management as evidenced by: daily monitoring and recording of CBG, improved adherence to ADA/ carb modified diet, exercise 3 days/week, adherence to prescribed medication regimen contacting provider for new or worsened symptoms or questions Interventions:  Collaboration with Iran Planas L, PA-C regarding development and update of comprehensive  plan of care as evidenced by provider attestation and co-signature Inter-disciplinary care team collaboration (see longitudinal plan of care) medications medications with patient. Reiterated food that can lower triglycerides: fish quinoa, avocado, coconut oil, garlic, cruciferous vegetable Discussed being managing food choices with upcoming Holiday. Discussed portion size Discussed scheduled/upcoming provider appointments. Discussed plans with patient for ongoing care management follow up and provided patient with direct contact information for care management team Self-Care Activities - Self administers oral medications as prescribed Attends all scheduled provider appointments Patient Goals: - continue to attend provider appointments as scheduled.  - continue to check and record blood sugar as recommended by your  provider and if you feel it is too high or too low - take the blood sugar log and glucose meter to all doctor visits - continue to take medications as prescribed. - Continue to monitor your salt intake and select healthy food choices during the Holiday season. - continue to eat healthy: lean meats, fruits, vegetables healthy fats; low carbohydrate-limit sugars, rice, potatoes and pasta; food high in fiber that include oily fish.  - foods that can lower triglycerides-fatty fish, quinoa, avocado, coconut oil, garlic and cruciferous vegetables. - continue to remain active - continue to work with care management team for ongoing care coordination and care management needs. Follow Up Plan: The patient has been provided with contact information for the care management team and has been advised to call with any health related questions or concerns.  The care management team will reach out to the patient again next month.    Plan:Telephone follow up appointment with care management team member scheduled for:  10/17/21 The patient has been provided with contact information for the care management team and has been advised to call with any health related questions or concerns.    Thea Silversmith, RN, MSN, BSN, CCM Care Management Coordinator [facility] [phone]

## 2021-09-17 DIAGNOSIS — B079 Viral wart, unspecified: Secondary | ICD-10-CM | POA: Diagnosis not present

## 2021-09-28 ENCOUNTER — Other Ambulatory Visit: Payer: Self-pay

## 2021-09-28 ENCOUNTER — Emergency Department (INDEPENDENT_AMBULATORY_CARE_PROVIDER_SITE_OTHER): Payer: Medicare HMO

## 2021-09-28 ENCOUNTER — Emergency Department
Admission: EM | Admit: 2021-09-28 | Discharge: 2021-09-28 | Disposition: A | Discharge status: Home / Self Care | Payer: Medicare HMO | Source: Home / Self Care

## 2021-09-28 ENCOUNTER — Encounter: Payer: Self-pay | Admitting: Emergency Medicine

## 2021-09-28 DIAGNOSIS — R0989 Other specified symptoms and signs involving the circulatory and respiratory systems: Secondary | ICD-10-CM

## 2021-09-28 DIAGNOSIS — J309 Allergic rhinitis, unspecified: Secondary | ICD-10-CM

## 2021-09-28 DIAGNOSIS — R059 Cough, unspecified: Secondary | ICD-10-CM

## 2021-09-28 MED ORDER — FEXOFENADINE HCL 180 MG PO TABS: 180.0000 mg | ORAL_TABLET | Freq: Every day | ORAL | 0 refills | Status: DC

## 2021-09-28 MED ORDER — CEFDINIR 300 MG PO CAPS: 300.0000 mg | ORAL_CAPSULE | Freq: Two times a day (BID) | ORAL | 0 refills | Status: AC

## 2021-09-28 NOTE — Discharge Instructions (Addendum)
Advised/instructed patient to take medication as directed with food to completion.  Advised patient to take Allegra with first dose of antibiotic for 5 of 10 days for concurrent postnasal drainage/drip may use as needed afterwards.  Advised patient may use Tessalon Perles daily or as needed for cough.

## 2021-09-28 NOTE — ED Triage Notes (Signed)
Cough with mucous since Tuesday

## 2021-09-28 NOTE — ED Provider Notes (Signed)
[name]    CSN: [id number] Arrival date & time: 09/28/21  1340      History   Chief Complaint Chief Complaint  Patient presents with   Cough    Pt c/o cough with mucous that started Tuesday. States today mucous looked blood tinged.     HPI Lindsey Fischer is a 80 y.o. female.   HPI Pleasant 80 year old female presents with cough and mucus for 3 days.  PMH significant for asthma, CKD stage III and T2DM.  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   Posterior vitreous detachment of both eyes 05/27/2017    Patient Active Problem List   Diagnosis Date Noted   Hypertension associated with diabetes ([place]) 06/11/2021   Age-related cataract of left eye 03/06/2021   Rib pain on left side 12/08/2020   Atopic dermatitis 08/28/2020   Seborrheic dermatitis 05/22/2020   BPPV (benign paroxysmal positional vertigo), right 02/18/2020   Hypertriglyceridemia 02/18/2020   Nausea 07/02/2019   Ingrown toenail of left foot 07/31/2018   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   Cervical radiculopathy 01/29/2018   Chronic neck pain 01/16/2018   Neck stiffness 08/20/2017   Numbness 08/20/2017   CKD (chronic kidney disease) stage 3, GFR 30-59 ml/min (HCC) 06/03/2017   Combined forms of age-related cataract of both eyes 05/27/2017   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   Posterior vitreous detachment of both eyes 05/27/2017   Elevated serum creatinine 04/23/2017   Pure hypercholesterolemia 04/22/2017   New daily persistent headache 10/22/2016   Dyslipidemia, goal LDL below 70 04/22/2016   Diverticulosis 12/20/2015   Gastritis and gastroduodenitis 40/97/3532   Umbilical hernia 99/24/2683   Type 2 diabetes mellitus with complication, without long-term current use of insulin ([place])  41/96/2229   Metabolic syndrome X 79/89/2119   ESSENTIAL HYPERTENSION, BENIGN 03/27/2010   Dyslipidemia 01/19/2010   Asthma 01/19/2010    Past Surgical History:  Procedure Laterality Date   APPENDECTOMY     CATARACT EXTRACTION Right    OVARIAN CYST REMOVAL      OB History   No obstetric history on file.      Home Medications    Prior to Admission medications   Medication Sig Start Date End Date Taking? Authorizing Provider  cefdinir (OMNICEF) 300 MG capsule Take 1 capsule (300 mg total) by mouth 2 (two) times daily for 10 days. 09/28/21 10/08/21 Yes Eliezer Lofts, FNP  fexofenadine [facility] ALLERGY) 180 MG tablet Take 1 tablet (180 mg total) by mouth daily for 15 days. 09/28/21 10/13/21 Yes Eliezer Lofts, FNP  albuterol (VENTOLIN HFA) 108 (90 Base) MCG/ACT inhaler INHALE 2 PUFFS BY MOUTH ONCE DAILY AS DIRECTED 12/12/20   [name], [name], PA-C  Alcohol Swabs (B-D SINGLE USE SWABS REGULAR) PADS 1 application by Does not apply route daily as needed (to use with injections). 07/10/20   [name], [name], PA-C  AMBULATORY NON FORMULARY MEDICATION Nebulizer machine and tubing  Dx:  COPD exacerbation 05/06/11   Bowen, Collene Leyden, DO  AMBULATORY NON FORMULARY MEDICATION 2 (two) times daily. Osteobiflex    [provider]  aspirin 500 MG tablet Take 500 mg by mouth every 6 (six) hours as needed for pain.    [provider]  Blood Glucose Monitoring Suppl (  TRUE METRIX METER) w/Device KIT USE AS DIRECTED 10/12/20   [name], [name], PA-C  fluticasone-salmeterol (WIXELA INHUB) 250-50 MCG/ACT AEPB Inhale 1 puff into the lungs in the morning and at bedtime. 09/05/21   [name], [name], PA-C  Fluticasone-Salmeterol (WIXELA INHUB) 250-50 MCG/DOSE AEPB INHALE 1 PUFF INTO THE LUNGS EVERY 12 HOURS 08/28/20   [name], [name], PA-C  ipratropium-albuterol (DUONEB) 0.5-2.5 (3) MG/3ML SOLN Take 3 mLs by nebulization every 6 (six) hours as needed. 06/05/21   [name], [name], PA-C   lisinopril-hydrochlorothiazide (ZESTORETIC) 20-25 MG tablet Take 1 tablet by mouth daily. 06/05/21   [name], PA-C  metFORMIN (GLUCOPHAGE) 500 MG tablet Take 1 tablet (500 mg total) by mouth 2 (two) times daily with a meal. 09/05/21   [name], [name], PA-C  montelukast (SINGULAIR) 10 MG tablet Take 1 tablet (10 mg total) by mouth at bedtime. 06/05/21   [name], [name], PA-C  Omega-3 Fatty Acids (FISH OIL) 1200 MG CAPS Take 1 capsule by mouth daily.    [provider]  rosuvastatin (CRESTOR) 20 MG tablet Take 1 tablet (20 mg total) by mouth daily. 12/11/20   [name], [name], PA-C  triamcinolone cream (KENALOG) 0.1 % Apply 1 application topically 2 (two) times daily. 05/19/20   [name], [name], PA-C  TRUE METRIX BLOOD GLUCOSE TEST test strip TEST BLOOD SUGAR TWICE DAILY AS NEEDED 03/02/21   [name], [name], PA-C  TRUEplus Lancets 33G MISC TEST BLOOD SUGAR TWICE DAILY AS NEEDED 03/02/21   [name], PA-C    Family History Family History  Problem Relation Age of Onset   Diabetes Mother    Heart attack Father 62       deceased   Cancer Sister        brain tumor   Cancer Brother        prostrate and lung CA   COPD Sister     Social History Social History   Tobacco Use   Smoking status: Former    Types: Cigarettes    Quit date: 11/11/1980    Years since quitting: 40.9   Smokeless tobacco: Never  Substance Use Topics   Alcohol use: No   Drug use: No     Allergies   Peanut-containing drug products, Livalo [pitavastatin], Mucinex [guaifenesin er], Prednisone, Zocor [simvastatin], Amlodipine, and Tramadol   Review of Systems Review of Systems  HENT:  Positive for postnasal drip.   Respiratory:  Positive for cough.   All other systems reviewed and are negative.   Physical Exam Triage Vital Signs ED Triage Vitals  Enc Vitals Group     BP 09/28/21 1413 134/77     Pulse Rate 09/28/21 1413 80     Resp 09/28/21 1413 20     Temp 09/28/21 1413 98.3 F (36.8  C)     Temp Source 09/28/21 1413 Oral     SpO2 09/28/21 1413 95 %     Weight 09/28/21 1414 165 lb (74.8 kg)     Height --      Head Circumference --      Peak Flow --      Pain Score 09/28/21 1414 0     Pain Loc --      Pain Edu? --      Excl. in [place]? --    No data found.  Updated Vital Signs BP 134/77 (BP Location: Right Arm)   Pulse 80   Temp 98.3 F (36.8 C) (Oral)  Resp 20   Wt 165 lb (74.8 kg)   SpO2 95%   BMI 29.23 kg/m      Physical Exam Vitals and nursing note reviewed.  Constitutional:      Appearance: Normal appearance. She is normal weight.  HENT:     Head: Normocephalic and atraumatic.     Right Ear: Tympanic membrane, ear canal and external ear normal.     Left Ear: Tympanic membrane, ear canal and external ear normal.     Mouth/Throat:     Mouth: Mucous membranes are moist.     Pharynx: Oropharynx is clear.     Comments: Moderate amount of clear drainage of posterior oropharynx noted Eyes:     Extraocular Movements: Extraocular movements intact.     Conjunctiva/sclera: Conjunctivae normal.     Pupils: Pupils are equal, round, and reactive to light.  Cardiovascular:     Rate and Rhythm: Normal rate and regular rhythm.     Pulses: Normal pulses.     Heart sounds: Normal heart sounds.  Pulmonary:     Effort: Pulmonary effort is normal.     Breath sounds: Rhonchi present. No wheezing or rales.     Comments: Mild diffuse scattered rhonchi noted bibasilar, infrequent nonproductive cough noted on exam. Chest:     Chest wall: No tenderness.  Musculoskeletal:        General: Normal range of motion.     Cervical back: Normal range of motion and neck supple.  Skin:    General: Skin is warm and dry.  Neurological:     General: No focal deficit present.     Mental Status: She is alert and oriented to person, place, and time.     UC Treatments / Results  Labs (all labs ordered are listed, but only abnormal results are displayed) Labs Reviewed - No  data to display  EKG   Radiology DG Chest 2 View  Result Date: 09/28/2021 CLINICAL DATA:  Congestion and productive cough. EXAM: CHEST - 2 VIEW COMPARISON:  December 06, 2020 FINDINGS: Mild, stable chronic appearing increased interstitial lung markings are seen. There is no evidence of acute infiltrate, pleural effusion or pneumothorax. The heart size and mediastinal contours are within normal limits. Degenerative changes are seen throughout the thoracic spine and bilateral shoulders. IMPRESSION: No active cardiopulmonary disease. Electronically Signed   By: Virgina Norfolk M.D.   On: 09/28/2021 15:47    Procedures Procedures (including critical care time)  Medications Ordered in UC Medications - No data to display  Initial Impression / Assessment and Plan / UC Course  I have reviewed the triage vital signs and the nursing notes.  Pertinent labs & imaging results that were available during my care of the patient were reviewed by me and considered in my medical decision making (see chart for details).     MDM: 1.  Cough-CXR revealed above-no active cardiopulmonary process, Rx'd cefdinir and Tessalon Perles; 2.  Allergic rhinitis-Rx'd Allegra. Advised/instructed patient to take medication as directed with food to completion.  Advised patient to take Allegra with first dose of antibiotic for 5 of 10 days for concurrent postnasal drainage/drip may use as needed afterwards.  Advised patient may use Tessalon Perles daily or as needed for cough.  Patient discharged home, hemodynamically stable. Final Clinical Impressions(s) / UC Diagnoses   Final diagnoses:  Cough, unspecified type  Allergic rhinitis, unspecified seasonality, unspecified trigger     Discharge Instructions      Advised/instructed patient to take medication  as directed with food to completion.  Advised patient to take Allegra with first dose of antibiotic for 5 of 10 days for concurrent postnasal drainage/drip may use as  needed afterwards.  Advised patient may use Tessalon Perles daily or as needed for cough.     ED Prescriptions     Medication Sig Dispense Auth. Provider   cefdinir (OMNICEF) 300 MG capsule Take 1 capsule (300 mg total) by mouth 2 (two) times daily for 10 days. 20 capsule Eliezer Lofts, FNP   fexofenadine [facility] ALLERGY) 180 MG tablet Take 1 tablet (180 mg total) by mouth daily for 15 days. 15 tablet Eliezer Lofts, FNP      PDMP not reviewed this encounter.   Eliezer Lofts, [place] 09/28/21 1624

## 2021-10-10 DIAGNOSIS — N1831 Chronic kidney disease, stage 3a: Secondary | ICD-10-CM

## 2021-10-10 DIAGNOSIS — E1122 Type 2 diabetes mellitus with diabetic chronic kidney disease: Secondary | ICD-10-CM

## 2021-10-10 DIAGNOSIS — I129 Hypertensive chronic kidney disease with stage 1 through stage 4 chronic kidney disease, or unspecified chronic kidney disease: Secondary | ICD-10-CM

## 2021-10-10 DIAGNOSIS — Z7984 Long term (current) use of oral hypoglycemic drugs: Secondary | ICD-10-CM

## 2021-10-17 ENCOUNTER — Ambulatory Visit: Payer: Medicare HMO

## 2021-10-17 DIAGNOSIS — I10 Essential (primary) hypertension: Secondary | ICD-10-CM

## 2021-10-17 DIAGNOSIS — N1831 Chronic kidney disease, stage 3a: Secondary | ICD-10-CM

## 2021-10-17 NOTE — Chronic Care Management (AMB) (Signed)
Chronic Care Management   CCM RN Visit Note  10/17/2021 Name: Lindsey Fischer MRN: [id number] DOB: 1941-11-03  Subjective: Lindsey Fischer is a 80 y.o. year old female who is a primary care patient of [name], PA-C. The care management team was consulted for assistance with disease management and care coordination needs.    Engaged with patient by telephone for follow up visit in response to provider referral for case management and/or care coordination services.   Consent to Services:  The patient was given information about Chronic Care Management services, agreed to services, and gave verbal consent prior to initiation of services.  Please see initial visit note for detailed documentation.   Patient agreed to services and verbal consent obtained.   Assessment: Review of patient past medical history, allergies, medications, health status, including review of consultants reports, laboratory and other test data, was performed as part of comprehensive evaluation and provision of chronic care management services.   SDOH (Social Determinants of Health) assessments and interventions performed:    CCM Care Plan  Allergies  Allergen Reactions   Peanut-Containing Drug Products Shortness Of Breath    Restricted breathing   Livalo [Pitavastatin] Other (See Comments)    Muscle cramps   Mucinex [Guaifenesin Er] Other (See Comments)    Made blood sugar go up to 212.    Prednisone Other (See Comments)    Bloating and abdominal pain, with oral product only.   Zocor [Simvastatin] Other (See Comments)    Muscle cramps    Amlodipine Other (See Comments)    Dizziness    Tramadol     Somnolence.     Outpatient Encounter Medications as of 10/17/2021  Medication Sig Note   albuterol (VENTOLIN HFA) 108 (90 Base) MCG/ACT inhaler INHALE 2 PUFFS BY MOUTH ONCE DAILY AS DIRECTED    fluticasone-salmeterol (WIXELA INHUB) 250-50 MCG/ACT AEPB Inhale 1 puff into the lungs in the morning and at  bedtime.    Fluticasone-Salmeterol (WIXELA INHUB) 250-50 MCG/DOSE AEPB INHALE 1 PUFF INTO THE LUNGS EVERY 12 HOURS    ipratropium-albuterol (DUONEB) 0.5-2.5 (3) MG/3ML SOLN Take 3 mLs by nebulization every 6 (six) hours as needed.    lisinopril-hydrochlorothiazide (ZESTORETIC) 20-25 MG tablet Take 1 tablet by mouth daily.    metFORMIN (GLUCOPHAGE) 500 MG tablet Take 1 tablet (500 mg total) by mouth 2 (two) times daily with a meal.    montelukast (SINGULAIR) 10 MG tablet Take 1 tablet (10 mg total) by mouth at bedtime.    Omega-3 Fatty Acids (FISH OIL) 1200 MG CAPS Take 1 capsule by mouth daily. 09/12/2021: 2400 mg fish oil capsule. Once a day.   rosuvastatin (CRESTOR) 20 MG tablet Take 1 tablet (20 mg total) by mouth daily.    triamcinolone cream (KENALOG) 0.1 % Apply 1 application topically 2 (two) times daily.    Alcohol Swabs (B-D SINGLE USE SWABS REGULAR) PADS 1 application by Does not apply route daily as needed (to use with injections).    AMBULATORY NON FORMULARY MEDICATION Nebulizer machine and tubing  Dx:  COPD exacerbation    AMBULATORY NON FORMULARY MEDICATION 2 (two) times daily. Osteobiflex 07/11/2021: Reports takes once a day   aspirin 500 MG tablet Take 500 mg by mouth every 6 (six) hours as needed for pain. (Patient not taking: Reported on 10/17/2021)    Blood Glucose Monitoring Suppl (TRUE METRIX METER) w/Device KIT USE AS DIRECTED    fexofenadine (ALLEGRA ALLERGY) 180 MG tablet Take 1 tablet (180 mg total)  by mouth daily for 15 days.    TRUE METRIX BLOOD GLUCOSE TEST test strip TEST BLOOD SUGAR TWICE DAILY AS NEEDED    TRUEplus Lancets 33G MISC TEST BLOOD SUGAR TWICE DAILY AS NEEDED    Facility-Administered Encounter Medications as of 10/17/2021  Medication   iopamidol (ISOVUE-300) 61 % injection 80 mL    Patient Active Problem List   Diagnosis Date Noted   Hypertension associated with diabetes ([place]) 06/11/2021   Age-related cataract of left eye 03/06/2021   Rib pain on  left side 12/08/2020   Atopic dermatitis 08/28/2020   Seborrheic dermatitis 05/22/2020   BPPV (benign paroxysmal positional vertigo), right 02/18/2020   Hypertriglyceridemia 02/18/2020   Nausea 07/02/2019   Ingrown toenail of left foot 07/31/2018   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   Cervical radiculopathy 01/29/2018   Chronic neck pain 01/16/2018   Neck stiffness 08/20/2017   Numbness 08/20/2017   CKD (chronic kidney disease) stage 3, GFR 30-59 ml/min (HCC) 06/03/2017   Combined forms of age-related cataract of both eyes 05/27/2017   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   Posterior vitreous detachment of both eyes 05/27/2017   Elevated serum creatinine 04/23/2017   Pure hypercholesterolemia 04/22/2017   New daily persistent headache 10/22/2016   Dyslipidemia, goal LDL below 70 04/22/2016   Diverticulosis 12/20/2015   Gastritis and gastroduodenitis 93/81/8299   Umbilical hernia 37/16/9678   Type 2 diabetes mellitus with complication, without long-term current use of insulin ([place]) 93/81/0175   Metabolic syndrome X 09/04/8526   ESSENTIAL HYPERTENSION, BENIGN 03/27/2010   Dyslipidemia 01/19/2010   Asthma 01/19/2010    Conditions to be addressed/monitored:HTN, HLD, DMII, CKD Stage 3, and Asthma   Care Plan : RN Care Manager Plan of Care  Updates made by [name], RN since 10/17/2021 12:00 AM     Problem: Chronic Disease Management Education and/or Care Coordination needs.   Priority: High     Long-Range Goal: Development of plan of care for Chronic Disease Management and/or Care Coordination needs   Start Date: 10/17/2021  Priority: High  Note:   Current Barriers: per chart: recent urgent care visit on 09/28/21 with cough; allergic Rhinitis, treated with Ceftin, allegra. Ms. Berryman reports she is feeling a lot better. She has also been using duoneb as prescribed and taking cough syrup. Blood sugar this morning was 128 and yesterday 120. She reports  she has not checked blood pressure in several days, but checked blood pressure during telephone assessment 143/67 HR 102 (Noted: she had just gone to get machine and had been coughing prior to taking this reading).  Ingredients on the bottle of cough syrup reviewed-noted it had sugar and tylenol and alcohol in it. After noting ingredients, Ms. Kiester states she is not going to take anymore and reported she grabbed the wrong bottle. She states she has Robitussin D also and meant to take it this morning. RNCM offered to have clinical pharmacist call to discuss medications and/or interactions. Ms. Riche declines at this time. She continues to be active and states she is feeling a lot better. Knowledge Deficits related to plan of care for management of HTN, HLD, DMII, CKD Stage 3, and Asthma   RNCM Clinical Goal(s):  Patient will demonstrate improved adherence to prescribed treatment plan for HTN, HLD, DMII, CKD Stage 3, and Asthma as evidenced by daily monitoring and recording of CBG, improved adherence to ADA/Care modified diet, exercise 3 days/week, adherence to prescribed meditation regimen  through collaboration with  RN Care manager, provider, and care team.   Interventions: 1:1 collaboration with primary care provider regarding development and update of comprehensive plan of care as evidenced by provider attestation and co-signature Inter-disciplinary care team collaboration (see longitudinal plan of care) Evaluation of current treatment plan related to  self management and patient's adherence to plan as established by provider  Asthma: (Status:Goal on track:  Yes.) Long Term Goal Provided patient with basic written and verbal Asthma education on self care/management/and exacerbation prevention Discussed Urgent care visit and reviewed medications prescribed.  Cough syrup ingredients reviewed with patient. Encouraged patient to discuss with local pharmacist ingredients that can increase blood  pressure in otc medications and/or discuss with clinical pharmacist. Reviewed medications and reinforced that duoneb contains albuterol and stressed the importance of not taking duoneb and albuterol inhaler together.   Diabetes Interventions:  (Status:  Goal on track:  Yes.) Long Term Goal Assessed patient's understanding of A1c goal: <7% Discussed plans with patient for ongoing care management follow up and provided patient with direct contact information for care management team Reviewed scheduled/upcoming provider appointments including: . Lab Results  Component Value Date   HGBA1C 6.5 (A) 09/05/2021  Hyperlipidemia Interventions:  (Status:  Goal on track:  Yes.) Long Term Goal Medication review performed; medication list updated in electronic medical record.  Encouraged to attend provider visits as scheduled  Hypertension and CKD Interventions:  (Status:  Goal on track:  Yes.) Long Term Goal Home reading today 143/67 HR 102 Last practice recorded BP readings:  BP Readings from Last 3 Encounters:  09/28/21 134/77  09/05/21 128/72  06/05/21 (!) 142/64  Most recent eGFR/CrCl:  Lab Results  Component Value Date   EGFR 44 (L) 06/05/2021    No components found for: CRCL  Reviewed medications with patient and discussed importance of compliance Reviewed ingredients of cough syrup patient    Patient Goals/Self-Care Activities: Take medications as prescribed   Attend all scheduled provider appointments Call provider office for new concerns or questions  Take blood pressure and record 2-3 time/week or more if blood pressure is out of normal range Continue to check and record blood sugar as recommended by your provider and if you feel it is too high or too low Take the blood sugar log and glucose meter to all doctor visits Continue to monitor your salt intake and select healthy food choices during the Holiday season. Continue to eat healthy: lean meats, fruits, vegetables healthy fats;  low carbohydrate-limit sugars, rice, potatoes and pasta; food high in fiber that include oily fish.  Foods that can lower triglycerides-fatty fish, quinoa, avocado, coconut oil, garlic and cruciferous vegetables. Continue to remain active Continue to work with care management team for ongoing care coordination and care management needs. Call Nurse Care coordinator if you would like to speak with the clinical pharmacist regarding medications.   Plan:Telephone follow up appointment with care management team member scheduled for:  11/21/21 The patient has been provided with contact information for the care management team and has been advised to call with any health related questions or concerns.   Thea Silversmith, RN, MSN, BSN, CCM Care Management Coordinator [facility] [phone]

## 2021-10-17 NOTE — Patient Instructions (Addendum)
Visit Information  Thank you for taking time to visit with me today. Please don't hesitate to contact me if I can be of assistance to you before our next scheduled telephone appointment.  Following are the goals we discussed today:  Patient Goals/Self-Care Activities: Take medications as prescribed   Attend all scheduled provider appointments Call provider office for new concerns or questions  Take blood pressure and record 2-3 time/week or more if blood pressure is out of normal range Continue to check and record blood sugar as recommended by your provider and if you feel it is too high or too low Take the blood sugar log and glucose meter to all doctor visits Continue to monitor your salt intake and select healthy food choices during the Holiday season. Continue to eat healthy: lean meats, fruits, vegetables healthy fats; low carbohydrate-limit sugars, rice, potatoes and pasta; food high in fiber that include oily fish.  Foods that can lower triglycerides-fatty fish, quinoa, avocado, coconut oil, garlic and cruciferous vegetables. Continue to remain active Continue to work with care management team for ongoing care coordination and care management needs. Call Nurse Care coordinator if you would like to speak with the clinical pharmacist regarding medications.  Our next appointment is by telephone on 11/21/21 at 2:00 pm  Please call the care guide team at [phone] if you need to cancel or reschedule your appointment.   If you are experiencing a Mental Health or [place] or need someone to talk to, please call the Suicide and Crisis Lifeline: 988 call 1-800-273-TALK (toll free, 24 hour hotline)   The patient verbalized understanding of instructions, educational materials, and care plan provided today and agreed to receive a mailed copy of patient instructions, educational materials, and care plan.   Thea Silversmith, RN, MSN, BSN, CCM Care Management  Coordinator [facility] [phone]    Asthma Action Plan, Adult An asthma action plan helps you understand how to manage your asthma and what to do when you have an asthma attack. The action plan is a color-coded plan that lists the symptoms that indicate whether or not your condition is under control and what actions to take. If you have symptoms in the green zone, you are doing well. If you have symptoms in the yellow zone, you are having problems. If you have symptoms in the red zone, you need medical care right away. Follow the plan that you and your health care provider develop. Review your plan with your health care provider at each visit. What triggers your asthma? Knowing the things that can trigger an asthma attack or make your asthma symptoms worse is very important. Talk to your health care provider about your asthma triggers and how to avoid them. Record your known asthma triggers here: _______________ What is your personal best peak flow reading? If you use a peak flow meter, determine your personal best reading. Record it here: _______________ [place] zone This zone means that your asthma is under control. You may not have any symptoms while you are in the green zone. This means that you: Have no coughing or wheezing, even while you are working or playing. Sleep through the night. Are breathing well. Have a peak flow reading that is above __________ (80% of your personal best or greater). If you are in the green zone, continue to manage your asthma as directed. Take these medicines every day: Controller medicine and dosage: _______________ Controller medicine and dosage: _______________ Controller medicine and dosage: _______________ Controller medicine and dosage:  _______________ Before exercise, use this reliever or rescue medicine: _______________ Call your health care provider if you are using a reliever or rescue medicine more than 2-3 times a week. Yellow  zone Symptoms in this zone mean that your condition may be getting worse. You may have symptoms that interfere with exercise, are noticeably worse after exposure to triggers, or are worse at the first sign of a cold (upper respiratory infection). These may include: Waking from sleep. Coughing, especially at night or first thing in the morning. Mild wheezing. Chest tightness. A peak flow reading that is __________ to __________ (50-79% of your personal best). If you have any of these symptoms: Add the following medicine to the ones that you use daily: Reliever or rescue medicine and dosage: _______________ Additional medicine and dosage: _______________ Call your health care provider if: You remain in the yellow zone for __________ hours. You are using a reliever or rescue medicine more than 2-3 times a week. Red zone Symptoms in this zone mean that you should get medical help right away. You will likely feel distressed and have symptoms at rest that restrict your activity. You are in the red zone if: You are breathing hard and quickly. Your nose opens wide, your ribs show, and your neck muscles become visible when you breathe in. Your lips, fingers, or toes are a bluish color. You have trouble speaking in full sentences. Your peak flow reading is less than __________ (less than 50% of your personal best). Your symptoms do not improve within 15-20 minutes after you use your reliever or rescue medicine (bronchodilator). If you have any of these symptoms: These symptoms represent a serious problem that is an emergency. Do not wait to see if the symptoms will go away. Get medical help right away. Call your local emergency services (911 in the [place] Do not drive yourself to the hospital. Use your reliever or rescue medicine. Start a nebulizer treatment or take 2-4 puffs from a metered-dose inhaler with a spacer. Repeat this action every 15-20 minutes until help arrives. Where to find more  information You can find more information about asthma from: Centers for Disease Control and Prevention: http://www.wolf.info/ American Lung Association: www.lung.org This information is not intended to replace advice given to you by your health care provider. Make sure you discuss any questions you have with your health care provider. Document Revised: 12/21/2020 Document Reviewed: 12/21/2020 Elsevier Patient Education  2022 [employer].

## 2021-11-08 DIAGNOSIS — E119 Type 2 diabetes mellitus without complications: Secondary | ICD-10-CM | POA: Diagnosis not present

## 2021-11-08 DIAGNOSIS — H524 Presbyopia: Secondary | ICD-10-CM | POA: Diagnosis not present

## 2021-11-08 DIAGNOSIS — Z7984 Long term (current) use of oral hypoglycemic drugs: Secondary | ICD-10-CM | POA: Diagnosis not present

## 2021-11-08 DIAGNOSIS — Z961 Presence of intraocular lens: Secondary | ICD-10-CM | POA: Diagnosis not present

## 2021-11-08 LAB — HM DIABETES EYE EXAM

## 2021-11-21 ENCOUNTER — Ambulatory Visit (INDEPENDENT_AMBULATORY_CARE_PROVIDER_SITE_OTHER): Payer: Medicare HMO

## 2021-11-21 DIAGNOSIS — I10 Essential (primary) hypertension: Secondary | ICD-10-CM

## 2021-11-21 DIAGNOSIS — E1122 Type 2 diabetes mellitus with diabetic chronic kidney disease: Secondary | ICD-10-CM

## 2021-11-21 NOTE — Chronic Care Management (AMB) (Signed)
Chronic Care Management   CCM RN Visit Note  11/21/2021 Name: Lindsey Fischer MRN: [id number] DOB: [birth year]  Subjective: Lindsey Fischer is a 81 y.o. year old female who is a primary care patient of [name], PA-C. The care management team was consulted for assistance with disease management and care coordination needs.    Engaged with patient by telephone for follow up visit in response to provider referral for case management and/or care coordination services.   Consent to Services:  The patient was given information about Chronic Care Management services, agreed to services, and gave verbal consent prior to initiation of services.  Please see initial visit note for detailed documentation.   Patient agreed to services and verbal consent obtained.   Assessment: Review of patient past medical history, allergies, medications, health status, including review of consultants reports, laboratory and other test data, was performed as part of comprehensive evaluation and provision of chronic care management services.   SDOH (Social Determinants of Health) assessments and interventions performed:    CCM Care Plan  Allergies  Allergen Reactions   Peanut-Containing Drug Products Shortness Of Breath    Restricted breathing   Livalo [Pitavastatin] Other (See Comments)    Muscle cramps   Mucinex [Guaifenesin Er] Other (See Comments)    Made blood sugar go up to 212.    Prednisone Other (See Comments)    Bloating and abdominal pain, with oral product only.   Zocor [Simvastatin] Other (See Comments)    Muscle cramps    Amlodipine Other (See Comments)    Dizziness    Tramadol     Somnolence.     Outpatient Encounter Medications as of 11/21/2021  Medication Sig Note   albuterol (VENTOLIN HFA) 108 (90 Base) MCG/ACT inhaler INHALE 2 PUFFS BY MOUTH ONCE DAILY AS DIRECTED    fluticasone-salmeterol (WIXELA INHUB) 250-50 MCG/ACT AEPB Inhale 1 puff into the lungs in the morning and at  bedtime.    ipratropium-albuterol (DUONEB) 0.5-2.5 (3) MG/3ML SOLN Take 3 mLs by nebulization every 6 (six) hours as needed.    lisinopril-hydrochlorothiazide (ZESTORETIC) 20-25 MG tablet Take 1 tablet by mouth daily.    metFORMIN (GLUCOPHAGE) 500 MG tablet Take 1 tablet (500 mg total) by mouth 2 (two) times daily with a meal.    montelukast (SINGULAIR) 10 MG tablet Take 1 tablet (10 mg total) by mouth at bedtime.    Omega-3 Fatty Acids (FISH OIL) 1200 MG CAPS Take 1 capsule by mouth daily. 09/12/2021: 2400 mg fish oil capsule. Once a day.   rosuvastatin (CRESTOR) 20 MG tablet Take 1 tablet (20 mg total) by mouth daily.    TRUE METRIX BLOOD GLUCOSE TEST test strip TEST BLOOD SUGAR TWICE DAILY AS NEEDED    Alcohol Swabs (B-D SINGLE USE SWABS REGULAR) PADS 1 application by Does not apply route daily as needed (to use with injections).    AMBULATORY NON FORMULARY MEDICATION Nebulizer machine and tubing  Dx:  COPD exacerbation    AMBULATORY NON FORMULARY MEDICATION 2 (two) times daily. Osteobiflex 07/11/2021: Reports takes once a day   aspirin 500 MG tablet Take 500 mg by mouth every 6 (six) hours as needed for pain. (Patient not taking: Reported on 10/17/2021)    Blood Glucose Monitoring Suppl (TRUE METRIX METER) w/Device KIT USE AS DIRECTED    fexofenadine (ALLEGRA ALLERGY) 180 MG tablet Take 1 tablet (180 mg total) by mouth daily for 15 days.    Fluticasone-Salmeterol (WIXELA INHUB) 250-50 MCG/DOSE AEPB INHALE 1  PUFF INTO THE LUNGS EVERY 12 HOURS    triamcinolone cream (KENALOG) 0.1 % Apply 1 application topically 2 (two) times daily. (Patient not taking: Reported on 11/21/2021)    TRUEplus Lancets 33G MISC TEST BLOOD SUGAR TWICE DAILY AS NEEDED    Facility-Administered Encounter Medications as of 11/21/2021  Medication   iopamidol (ISOVUE-300) 61 % injection 80 mL    Patient Active Problem List   Diagnosis Date Noted   Hypertension associated with diabetes ([place]) 06/11/2021   Age-related  cataract of left eye 03/06/2021   Rib pain on left side 12/08/2020   Atopic dermatitis 08/28/2020   Seborrheic dermatitis 05/22/2020   BPPV (benign paroxysmal positional vertigo), right 02/18/2020   Hypertriglyceridemia 02/18/2020   Nausea 07/02/2019   Ingrown toenail of left foot 07/31/2018   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   Cervical radiculopathy 01/29/2018   Chronic neck pain 01/16/2018   Neck stiffness 08/20/2017   Numbness 08/20/2017   CKD (chronic kidney disease) stage 3, GFR 30-59 ml/min (HCC) 06/03/2017   Combined forms of age-related cataract of both eyes 05/27/2017   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   Posterior vitreous detachment of both eyes 05/27/2017   Elevated serum creatinine 04/23/2017   Pure hypercholesterolemia 04/22/2017   New daily persistent headache 10/22/2016   Dyslipidemia, goal LDL below 70 04/22/2016   Diverticulosis 12/20/2015   Gastritis and gastroduodenitis 76/28/3151   Umbilical hernia 76/16/0737   Type 2 diabetes mellitus with complication, without long-term current use of insulin ([place]) 10/62/6948   Metabolic syndrome X 54/62/7035   ESSENTIAL HYPERTENSION, BENIGN 03/27/2010   Dyslipidemia 01/19/2010   Asthma 01/19/2010    Conditions to be addressed/monitored:HTN, HLD, DMII, and Asthma  Care Plan : RN Care Manager Plan of Care  Updates made by [name], RN since 11/21/2021 12:00 AM     Problem: Chronic Disease Management Education and/or Care Coordination needs.   Priority: High     Long-Range Goal: Development of plan of care for Chronic Disease Management and/or Care Coordination needs   Start Date: 10/17/2021  Priority: High  Note:   Current Barriers: Lindsey Fischer is currently visiting her sister out of town. She reports an episode of wheezing last night-used duonebulizer with relief. No respiratory signs or symptoms at this time. Blood sugar this morning was 116. Last blood pressure check was this past  weekend-does not remember reading, reports it was not high. Lindsey Fischer continues to walk for exercise at the [facility] at one day a week on an indoor track and walks at least one mile. She states she plans to increase the number of days she goes to the [facility]. She also reports she continues to minimizing salt in her diet. 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 She is expressing no questions or concerns. She states, "I think I am doing pretty good myself". Knowledge Deficits related to plan of care for management of HTN, HLD, DMII, CKD Stage 3, and Asthma   RNCM Clinical Goal(s):  Patient will demonstrate improved adherence to prescribed treatment plan for HTN, HLD, DMII, CKD Stage 3, and Asthma as evidenced by daily monitoring and recording of CBG, improved adherence to ADA/Care modified diet, exercise 3 days/week, adherence to prescribed meditation regimen  through collaboration with RN Care manager, provider, and care team.   Interventions: 1:1 collaboration with primary care provider regarding development and update of comprehensive plan of care as  evidenced by provider attestation and co-signature Inter-disciplinary care team collaboration (see longitudinal plan of care) Evaluation of current treatment plan related to  self management and patient's adherence to plan as established by provider  Asthma: (Status:Goal on track:  Yes.) Long Term Goal Provided patient with basic written and verbal Asthma education on self care/management/and exacerbation prevention Medications reviewed Discussed possible triggers Discussed importance of and Encouraged patient to keep inhaler on her person at all times even when at [facility] walking.   Diabetes Interventions:  (Status:  Goal on track:  Yes.) Long Term Goal Assessed patient's understanding of A1c goal: <7% Encouraged patient to continue to take medications as prescribed Upcoming  appointments reviewed-next PCP visit 03/06/22 Lab Results  Component Value Date   HGBA1C 6.5 (A) 09/05/2021  Hyperlipidemia Interventions:  (Status:  Goal on track:  Yes.) Long Term Goal Medication review performed; medication list updated in electronic medical record.  Encouraged to attend provider visits as scheduled Discussed diet with patient Encouraged to continue to eat Healthy fats  Hypertension and CKD Interventions:  (Status:  Goal on track:  Yes.) Long Term Goal Last practice recorded BP readings:  BP Readings from Last 3 Encounters:  09/28/21 134/77  09/05/21 128/72  06/05/21 (!) 142/64  Most recent eGFR/CrCl:  Lab Results  Component Value Date   EGFR 44 (L) 06/05/2021    No components found for: CRCL  Reviewed medications with patient and discussed importance of compliance Reviewed ingredients of cough syrup patient    Patient Goals/Self-Care Activities: Take medications as prescribed   Attend all scheduled provider appointments Call provider office for new concerns or questions  Take blood pressure and record 1-2 times/week or more if blood pressure is out of normal range Continue to check and record blood sugar as recommended by your provider and if you feel it is too high or too low. Take to your provider visits Continue to eat healthy: lean meats, fruits, vegetables healthy fats; low carbohydrate-limit sugars, rice, potatoes and pasta  Foods that can lower triglycerides-fatty fish, quinoa, avocado, coconut oil, garlic and cruciferous vegetables. Continue to remain active Review education material on asthma and physical activity and plan to discuss at next telephone call   Plan:Telephone follow up appointment with care management team member scheduled for:  12/26/21 The patient has been provided with contact information for the care management team and has been advised to call with any health related questions or concerns.   Thea Silversmith, RN, MSN, BSN, CCM Care  Management Coordinator [facility] [phone]

## 2021-11-21 NOTE — Patient Instructions (Addendum)
Visit Information  Thank you for taking time to visit with me today. Please don't hesitate to contact me if I can be of assistance to you before our next scheduled telephone appointment.  Following are the goals we discussed today:  Patient Goals/Self-Care Activities: Take medications as prescribed   Attend all scheduled provider appointments Call provider office for new concerns or questions  Take blood pressure and record 1-2 times/week or more if blood pressure is out of normal range Continue to check and record blood sugar as recommended by your provider and if you feel it is too high or too low. Take to your provider visits Continue to eat healthy: lean meats, fruits, vegetables healthy fats; low carbohydrate-limit sugars, rice, potatoes and pasta  Foods that can lower triglycerides-fatty fish, quinoa, avocado, coconut oil, garlic and cruciferous vegetables. Continue to remain active Review education material on asthma and physical activity and plan to discuss at next telephone call  Our next appointment is by telephone on 12/26/21 at 2 pm  Please call the care guide team at [phone] if you need to cancel or reschedule your appointment.   If you are experiencing a Mental Health or [place] or need someone to talk to, please call the Suicide and Crisis Lifeline: 988 call 1-800-273-TALK (toll free, 24 hour hotline)   The patient verbalized understanding of instructions, educational materials, and care plan provided today and agreed to receive a mailed copy of patient instructions, educational materials, and care plan.   Thea Silversmith, RN, MSN, BSN, CCM Care Management Coordinator [facility] [phone]  Asthma and Physical Activity Physical activity is an important part of a healthy lifestyle. If you have asthma, it is important to exercise because physical activity can help you to: Control your asthma. Maintain your weight or lose weight. Increase  your energy. Decrease stress and anxiety. Lower your risk of getting sick. Improve your heart health. However, asthma symptoms can flare up when you are physically active or exercising. You can learn how to control your asthma and prevent symptoms during exercise. This will help you remain physically active. How can asthma affect my ability to be physically active? When you have asthma, physical activity can cause you to have symptoms such as: Wheezing. This may sound like whistling while breathing. A feeling of tightness in the chest, or chest pain. A sore throat. Coughing. Shortness of breath. Tiredness (fatigue) with minimal activity. Increased sputum production. What actions can I take to prevent asthma problems during physical activity? Start by sticking to your daily asthma medicine regimen. Keeping your asthma under control will allow you to enjoy exercise and sports participation. Asthma action plan Follow the asthma action plan set by your health care provider. Your personal asthma plan may include: Taking your daily controller (maintenance) asthma medicines as told by your health care provider. Using your rescue inhaler before exercise as told by your health care provider. Avoiding your asthma triggers, except physical activity. Triggers may include cold air, dust, pollen, pet dander, and air pollution. Being aware of worsening symptoms. Tracking your asthma control. Using a peak flow meter. Knowing when to seek emergency care.  Proper breathing During exercise, follow these tips for proper breathing: Breathe in before starting the exercise and breathe out during the part of the exercise that takes the most effort. Take slow breaths. Pace yourself. Do not try to go too fast. While breathing out, purse your lips. Before beginning any exercise program or new activity, talk with your health  care provider. Pulmonary rehabilitation Ask your health care provider about signing up  for a pulmonary rehabilitation program. Benefits of this type of program include: Education on lung diseases. Classes that teach you how to exercise and be more active while decreasing your shortness of breath. A group setting that allows you to talk with others who have asthma. General information Exercise indoors when the air is dry or during allergy season. Try to breathe in warm, moist air by wearing a scarf loosely over your nose and mouth or breathing only through your nose. Spend a few minutes warming up before you exercise or begin an activity or workout. Cool down after exercise. What should I do if my asthma symptoms get worse? Contact your health care provider if your asthma symptoms are getting worse. Your asthma is getting worse if: You have symptoms more often. Your symptoms are more severe. Your symptoms get worse at night and make you lose sleep. Your peak flow number is lower than your personal best or changes from day to day. Your asthma medicines do not work as well as they used to. You use your rescue inhaler more often. If you use your rescue inhaler more than 2 days a week, your asthma is not well controlled. You go to the emergency room or see your health care provider because of an asthma attack. Where can I get more information? Ask your health care provider about asthma support groups in your area. American Lung Association: lung.org National Heart, Lung, and Blood Institute: https://www.hartman-hill.biz/ Centers for Disease Control and Prevention: StoreMirror.com.cy Contact a health care provider if: You have trouble walking and talking because you are out of breath. Get help right away if: Your lips or fingernails are blue. You are not able to breathe or catch your breath. These symptoms may represent a serious problem that is an emergency. Do not wait to see if the symptoms will go away. Get medical help right away. Call your local emergency services (911 in the [place] Do not drive  yourself to the hospital. Summary Physical activity is an important part of a healthy lifestyle. However, if you have asthma, your symptoms can flare up during exercise or physical activity. You can prevent problems during physical activity by following your asthma action plan, doing proper breathing, and enrolling in a pulmonary rehabilitation program. Talk with your health care provider before starting any exercise program or new activity. This information is not intended to replace advice given to you by your health care provider. Make sure you discuss any questions you have with your health care provider. Document Revised: 12/18/2020 Document Reviewed: 12/18/2020 Elsevier Patient Education  [place].

## 2021-11-22 ENCOUNTER — Other Ambulatory Visit: Payer: Self-pay | Admitting: Physician Assistant

## 2021-11-22 DIAGNOSIS — I10 Essential (primary) hypertension: Secondary | ICD-10-CM

## 2021-12-11 DIAGNOSIS — I10 Essential (primary) hypertension: Secondary | ICD-10-CM | POA: Diagnosis not present

## 2021-12-11 DIAGNOSIS — E1122 Type 2 diabetes mellitus with diabetic chronic kidney disease: Secondary | ICD-10-CM

## 2021-12-11 DIAGNOSIS — N1831 Chronic kidney disease, stage 3a: Secondary | ICD-10-CM | POA: Diagnosis not present

## 2021-12-22 ENCOUNTER — Other Ambulatory Visit: Payer: Self-pay | Admitting: Physician Assistant

## 2021-12-26 ENCOUNTER — Other Ambulatory Visit: Payer: Self-pay

## 2021-12-26 ENCOUNTER — Ambulatory Visit (INDEPENDENT_AMBULATORY_CARE_PROVIDER_SITE_OTHER): Payer: Medicare HMO | Admitting: Physician Assistant

## 2021-12-26 ENCOUNTER — Encounter: Payer: Self-pay | Admitting: Physician Assistant

## 2021-12-26 ENCOUNTER — Telehealth: Payer: Medicare HMO

## 2021-12-26 ENCOUNTER — Telehealth: Payer: Self-pay

## 2021-12-26 VITALS — BP 157/66 | HR 72 | Ht 63.0 in | Wt 166.0 lb

## 2021-12-26 DIAGNOSIS — R1032 Left lower quadrant pain: Secondary | ICD-10-CM

## 2021-12-26 DIAGNOSIS — K429 Umbilical hernia without obstruction or gangrene: Secondary | ICD-10-CM

## 2021-12-26 DIAGNOSIS — R110 Nausea: Secondary | ICD-10-CM

## 2021-12-26 DIAGNOSIS — D649 Anemia, unspecified: Secondary | ICD-10-CM | POA: Diagnosis not present

## 2021-12-26 DIAGNOSIS — K5790 Diverticulosis of intestine, part unspecified, without perforation or abscess without bleeding: Secondary | ICD-10-CM

## 2021-12-26 DIAGNOSIS — R1030 Lower abdominal pain, unspecified: Secondary | ICD-10-CM | POA: Insufficient documentation

## 2021-12-26 DIAGNOSIS — R350 Frequency of micturition: Secondary | ICD-10-CM

## 2021-12-26 LAB — POCT URINALYSIS DIP (CLINITEK)
Bilirubin, UA: NEGATIVE
Blood, UA: NEGATIVE
Glucose, UA: NEGATIVE mg/dL
Ketones, POC UA: NEGATIVE mg/dL
Leukocytes, UA: NEGATIVE
Nitrite, UA: NEGATIVE
POC PROTEIN,UA: NEGATIVE
Spec Grav, UA: 1.02 (ref 1.010–1.025)
Urobilinogen, UA: NEGATIVE E.U./dL — AB
pH, UA: 5.5 (ref 5.0–8.0)

## 2021-12-26 NOTE — Patient Instructions (Signed)
Need STAT CT.

## 2021-12-26 NOTE — Telephone Encounter (Signed)
°  Care Management   Follow Up Note   12/26/2021 Name: Lindsey Fischer MRN: [id number] DOB: [birth year]   Referred by: [name], PA-C Reason for referral : No chief complaint on file.   RNCM completed outreach to [name] today. She was seen by PCP today and reports she is on her way to get a scan. RNCM will reschedule patient for next week.  Follow Up Plan:Telephone follow up appointment with care management team member scheduled for: 01/02/22  Thea Silversmith, RN, MSN, BSN, [facility] Care Management Coordinator [facility] [phone]

## 2021-12-26 NOTE — Progress Notes (Signed)
Subjective:    Patient ID: Lindsey Fischer, female    DOB: [birth year], 81 y.o.   MRN: [id number]  HPI Pt presents to the clinic with lower abdominal pain worse left than right for last 2 weeks. She has hx of diverticulosis but not diverticulitis. She denies any fever, chills, body aches, flank pain. She has had increase in urinary frequency and some nausea. She denies any stool changes or reflux issues. Not tried anything to make better. Denies any vaginal bleeding.   Known umbilical hernia. Seems like it could be enlarging.   .. Active Ambulatory Problems    Diagnosis Date Noted   Dyslipidemia 01/19/2010   ESSENTIAL HYPERTENSION, BENIGN 03/27/2010   Asthma 09/62/8366   Metabolic syndrome X 29/47/6546   Type 2 diabetes mellitus with complication, without long-term current use of insulin ([place]) 50/35/4656   Umbilical hernia 81/27/5170   Diverticulosis 12/20/2015   Gastritis and gastroduodenitis 12/20/2015   Dyslipidemia, goal LDL below 70 04/22/2016   New daily persistent headache 10/22/2016   Pure hypercholesterolemia 04/22/2017   Elevated serum creatinine 04/23/2017   Combined forms of age-related cataract of both eyes 05/27/2017   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   Posterior vitreous detachment of both eyes 05/27/2017   CKD (chronic kidney disease) stage 3, GFR 30-59 ml/min (HCC) 06/03/2017   Neck stiffness 08/20/2017   Numbness 08/20/2017   Chronic neck pain 01/16/2018   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   Cervical radiculopathy 01/29/2018   Ingrown toenail of left foot 07/31/2018   Nausea 07/02/2019   BPPV (benign paroxysmal positional vertigo), right 02/18/2020   Hypertriglyceridemia 02/18/2020   Seborrheic dermatitis 05/22/2020   Atopic dermatitis 08/28/2020   Rib pain on left side 12/08/2020   Age-related cataract of left eye 03/06/2021   Hypertension associated with diabetes ([place]) 06/11/2021   Left lower quadrant abdominal pain 12/26/2021    Resolved Ambulatory Problems    Diagnosis Date Noted   GRIEF REACTION, ACUTE 03/27/2010   Impaired fasting glucose 03/27/2010   Hypoxemia 05/22/2010   Pneumonia 05/06/2011   Epigastric abdominal tenderness with rebound tenderness 12/19/2015   Non-intractable cyclical vomiting with nausea 12/19/2015   Abdominal guarding 12/19/2015   Diarrhea 01/74/9449   Helicobacter pylori gastritis 12/20/2015   Atypical chest pain 10/22/2016   Neck muscle spasm 08/20/2017   Other headache syndrome 08/20/2017   Pneumonia of both lower lobes due to infectious organism 07/06/2019   Past Medical History:  Diagnosis Date   Asthma    Diabetes mellitus    Hyperlipidemia    Hypertension        Review of Systems    See HPI.  Objective:   Physical Exam Vitals reviewed.  Constitutional:      Appearance: She is well-developed. She is obese.  HENT:     Head: Normocephalic.  Abdominal:     General: Bowel sounds are decreased. There is distension.     Palpations: Abdomen is soft. There is no shifting dullness, hepatomegaly or splenomegaly.     Tenderness: There is abdominal tenderness in the right lower quadrant, suprapubic area, left upper quadrant and left lower quadrant. There is guarding. There is no right CVA tenderness, left CVA tenderness or rebound. Negative signs include Murphy's sign and McBurney's sign.     Hernia: A hernia is present. Hernia is present in the umbilical area.     Comments: Large baseball size umbilical hernia, reducible and not tender to palpation.   Neurological:  Mental Status: She is alert and oriented to person, place, and time.  Psychiatric:        Mood and Affect: Mood normal.         Assessment & Plan:  Marland KitchenMarland KitchenNathalia was seen today for abdominal pain.  Diagnoses and all orders for this visit:  Left lower quadrant abdominal pain -     CT ABDOMEN PELVIS W CONTRAST; Future  Urinary frequency -     POCT URINALYSIS DIP (CLINITEK) -     Urine  Culture  Umbilical hernia without obstruction and without gangrene  Nausea  Diverticulosis   I don't think causing pain today but consider umbilical hernia repair.   ?diverticulitis due to guarding and hx of diverticulosis. No red flags.  CT abd and pelvis with contrast ordered today, STAT.  Will call pt with next steps.  UA positive for urobiligen only, no leuks/blood/nitrites/protein. Will culture.

## 2021-12-26 NOTE — Addendum Note (Signed)
Addended by: [name] on: 12/26/2021 02:58 PM   Modules accepted: Orders

## 2021-12-27 ENCOUNTER — Ambulatory Visit (HOSPITAL_BASED_OUTPATIENT_CLINIC_OR_DEPARTMENT_OTHER)
Admission: RE | Admit: 2021-12-27 | Discharge: 2021-12-27 | Disposition: A | Discharge status: Home / Self Care | Payer: Medicare HMO | Source: Ambulatory Visit | Attending: Physician Assistant | Admitting: Physician Assistant

## 2021-12-27 ENCOUNTER — Encounter (HOSPITAL_BASED_OUTPATIENT_CLINIC_OR_DEPARTMENT_OTHER): Payer: Self-pay

## 2021-12-27 DIAGNOSIS — I700 Atherosclerosis of aorta: Secondary | ICD-10-CM | POA: Diagnosis not present

## 2021-12-27 DIAGNOSIS — K429 Umbilical hernia without obstruction or gangrene: Secondary | ICD-10-CM | POA: Diagnosis not present

## 2021-12-27 DIAGNOSIS — R1032 Left lower quadrant pain: Secondary | ICD-10-CM | POA: Insufficient documentation

## 2021-12-27 DIAGNOSIS — K5730 Diverticulosis of large intestine without perforation or abscess without bleeding: Secondary | ICD-10-CM | POA: Diagnosis not present

## 2021-12-27 MED ORDER — IOHEXOL 300 MG/ML  SOLN
100.0000 mL | Freq: Once | INTRAMUSCULAR | Status: AC | PRN
  Administered 2021-12-27: 100 mL via INTRAVENOUS

## 2021-12-27 NOTE — Progress Notes (Signed)
Normal lipase.  Kidney function stable.  You are anemic. Get ferritin and serum iron added on.  Are you having any black tarry stools?  Get CT done today.

## 2021-12-28 ENCOUNTER — Other Ambulatory Visit: Payer: Self-pay | Admitting: Physician Assistant

## 2021-12-28 DIAGNOSIS — K429 Umbilical hernia without obstruction or gangrene: Secondary | ICD-10-CM

## 2021-12-28 DIAGNOSIS — K5792 Diverticulitis of intestine, part unspecified, without perforation or abscess without bleeding: Secondary | ICD-10-CM | POA: Insufficient documentation

## 2021-12-28 LAB — URINE CULTURE
MICRO NUMBER:: 13017952
Result:: NO GROWTH
SPECIMEN QUALITY:: ADEQUATE

## 2021-12-28 MED ORDER — CIPROFLOXACIN HCL 500 MG PO TABS: 500.0000 mg | ORAL_TABLET | Freq: Two times a day (BID) | ORAL | 0 refills | Status: AC

## 2021-12-28 MED ORDER — METRONIDAZOLE 500 MG PO TABS: 500.0000 mg | ORAL_TABLET | Freq: Three times a day (TID) | ORAL | 0 refills | Status: AC

## 2021-12-28 NOTE — Progress Notes (Signed)
You do have some acute diverticulitis causing the pain. Sent cipro and metronidazole to pick up and star   Umbilical hernia noted without obstruction. I would like to send you for consult removal for this.

## 2021-12-30 LAB — CBC WITH DIFFERENTIAL/PLATELET
Absolute Monocytes: 672 cells/uL (ref 200–950)
Basophils Absolute: 70 cells/uL (ref 0–200)
Basophils Relative: 1.1 %
Eosinophils Absolute: 269 cells/uL (ref 15–500)
Eosinophils Relative: 4.2 %
HCT: 31.9 % — ABNORMAL LOW (ref 35.0–45.0)
Hemoglobin: 10.7 g/dL — ABNORMAL LOW (ref 11.7–15.5)
Lymphs Abs: 2426 cells/uL (ref 850–3900)
MCH: 27.7 pg (ref 27.0–33.0)
MCHC: 33.5 g/dL (ref 32.0–36.0)
MCV: 82.6 fL (ref 80.0–100.0)
MPV: 10.6 fL (ref 7.5–12.5)
Monocytes Relative: 10.5 %
Neutro Abs: 2963 cells/uL (ref 1500–7800)
Neutrophils Relative %: 46.3 %
Platelets: 274 10*3/uL (ref 140–400)
RBC: 3.86 10*6/uL (ref 3.80–5.10)
RDW: 13.6 % (ref 11.0–15.0)
Total Lymphocyte: 37.9 %
WBC: 6.4 10*3/uL (ref 3.8–10.8)

## 2021-12-30 LAB — COMPLETE METABOLIC PANEL WITH GFR
AG Ratio: 1.7 (calc) (ref 1.0–2.5)
ALT: 10 U/L (ref 6–29)
AST: 15 U/L (ref 10–35)
Albumin: 4.1 g/dL (ref 3.6–5.1)
Alkaline phosphatase (APISO): 50 U/L (ref 37–153)
BUN/Creatinine Ratio: 19 (calc) (ref 6–22)
BUN: 22 mg/dL (ref 7–25)
CO2: 29 mmol/L (ref 20–32)
Calcium: 9.6 mg/dL (ref 8.6–10.4)
Chloride: 105 mmol/L (ref 98–110)
Creat: 1.14 mg/dL — ABNORMAL HIGH (ref 0.60–0.95)
Globulin: 2.4 g/dL (calc) (ref 1.9–3.7)
Glucose, Bld: 142 mg/dL — ABNORMAL HIGH (ref 65–99)
Potassium: 3.9 mmol/L (ref 3.5–5.3)
Sodium: 140 mmol/L (ref 135–146)
Total Bilirubin: 0.3 mg/dL (ref 0.2–1.2)
Total Protein: 6.5 g/dL (ref 6.1–8.1)
eGFR: 49 mL/min/{1.73_m2} — ABNORMAL LOW (ref >=60)

## 2021-12-30 LAB — IRON,TIBC AND FERRITIN PANEL
%SAT: 18 % (calc) (ref 16–45)
Ferritin: 20 ng/mL (ref 16–288)
Iron: 66 ug/dL (ref 45–160)
TIBC: 358 mcg/dL (calc) (ref 250–450)

## 2021-12-30 LAB — LIPASE: Lipase: 41 U/L (ref 7–60)

## 2021-12-31 ENCOUNTER — Other Ambulatory Visit: Payer: Self-pay | Admitting: Neurology

## 2021-12-31 DIAGNOSIS — E611 Iron deficiency: Secondary | ICD-10-CM

## 2021-12-31 NOTE — Progress Notes (Signed)
Take 325mg  iron tablet daily with breakfast. Recheck hemoglobin in 2 weeks.

## 2022-01-02 ENCOUNTER — Ambulatory Visit (INDEPENDENT_AMBULATORY_CARE_PROVIDER_SITE_OTHER): Payer: Medicare HMO

## 2022-01-02 DIAGNOSIS — N1831 Type 2 diabetes mellitus with diabetic chronic kidney disease: Secondary | ICD-10-CM

## 2022-01-02 DIAGNOSIS — K5792 Diverticulitis of intestine, part unspecified, without perforation or abscess without bleeding: Secondary | ICD-10-CM

## 2022-01-02 DIAGNOSIS — E1122 Type 2 diabetes mellitus with diabetic chronic kidney disease: Secondary | ICD-10-CM

## 2022-01-02 DIAGNOSIS — I10 Essential (primary) hypertension: Secondary | ICD-10-CM

## 2022-01-02 NOTE — Chronic Care Management (AMB) (Signed)
Chronic Care Management   CCM RN Visit Note  01/02/2022 Name: Lindsey Fischer MRN: [id number] DOB: 03/03/1941  Subjective: Lindsey Fischer is a 81 y.o. year old female who is a primary care patient of [name], PA-C. The care management team was consulted for assistance with disease management and care coordination needs.    Engaged with patient by telephone for follow up visit in response to provider referral for case management and/or care coordination services.   Consent to Services:  The patient was given information about Chronic Care Management services, agreed to services, and gave verbal consent prior to initiation of services.  Please see initial visit note for detailed documentation.   Patient agreed to services and verbal consent obtained.   Assessment: Review of patient past medical history, allergies, medications, health status, including review of consultants reports, laboratory and other test data, was performed as part of comprehensive evaluation and provision of chronic care management services.   SDOH (Social Determinants of Health) assessments and interventions performed:    CCM Care Plan  Allergies  Allergen Reactions   Peanut-Containing Drug Products Shortness Of Breath    Restricted breathing   Livalo [Pitavastatin] Other (See Comments)    Muscle cramps   Mucinex [Guaifenesin Er] Other (See Comments)    Made blood sugar go up to 212.    Prednisone Other (See Comments)    Bloating and abdominal pain, with oral product only.   Zocor [Simvastatin] Other (See Comments)    Muscle cramps    Amlodipine Other (See Comments)    Dizziness    Tramadol     Somnolence.     Outpatient Encounter Medications as of 01/02/2022  Medication Sig Note   albuterol (VENTOLIN HFA) 108 (90 Base) MCG/ACT inhaler INHALE 2 PUFFS BY MOUTH ONCE DAILY AS DIRECTED    AMBULATORY NON FORMULARY MEDICATION 2 (two) times daily. Osteobiflex 07/11/2021: Reports takes once a day    ciprofloxacin (CIPRO) 500 MG tablet Take 1 tablet (500 mg total) by mouth 2 (two) times daily for 10 days.    ferrous sulfate 325 (65 FE) MG tablet Take 325 mg by mouth daily with breakfast.    fluticasone-salmeterol (WIXELA INHUB) 250-50 MCG/ACT AEPB Inhale 1 puff into the lungs in the morning and at bedtime.    ipratropium-albuterol (DUONEB) 0.5-2.5 (3) MG/3ML SOLN Take 3 mLs by nebulization every 6 (six) hours as needed.    lisinopril-hydrochlorothiazide (ZESTORETIC) 20-25 MG tablet TAKE 1 TABLET EVERY DAY    metFORMIN (GLUCOPHAGE) 500 MG tablet Take 1 tablet (500 mg total) by mouth 2 (two) times daily with a meal.    metroNIDAZOLE (FLAGYL) 500 MG tablet Take 1 tablet (500 mg total) by mouth 3 (three) times daily for 10 days.    montelukast (SINGULAIR) 10 MG tablet Take 1 tablet (10 mg total) by mouth at bedtime.    rosuvastatin (CRESTOR) 20 MG tablet TAKE 1 TABLET EVERY DAY    Alcohol Swabs (B-D SINGLE USE SWABS REGULAR) PADS 1 application by Does not apply route daily as needed (to use with injections).    AMBULATORY NON FORMULARY MEDICATION Nebulizer machine and tubing  Dx:  COPD exacerbation    Blood Glucose Monitoring Suppl (TRUE METRIX METER) w/Device KIT USE AS DIRECTED    fexofenadine (ALLEGRA ALLERGY) 180 MG tablet Take 1 tablet (180 mg total) by mouth daily for 15 days.    Fluticasone-Salmeterol (WIXELA INHUB) 250-50 MCG/DOSE AEPB INHALE 1 PUFF INTO THE LUNGS EVERY 12 HOURS  Omega-3 Fatty Acids (FISH OIL) 1200 MG CAPS Take 1 capsule by mouth daily. 01/02/2022: Reports takes every other day    TRUE METRIX BLOOD GLUCOSE TEST test strip TEST BLOOD SUGAR TWICE DAILY AS NEEDED    TRUEplus Lancets 33G MISC TEST BLOOD SUGAR TWICE DAILY AS NEEDED    Facility-Administered Encounter Medications as of 01/02/2022  Medication   iopamidol (ISOVUE-300) 61 % injection 80 mL    Patient Active Problem List   Diagnosis Date Noted   Diverticulitis 12/28/2021   Left lower quadrant abdominal pain  12/26/2021   Hypertension associated with diabetes ([place]) 06/11/2021   Age-related cataract of left eye 03/06/2021   Rib pain on left side 12/08/2020   Atopic dermatitis 08/28/2020   Seborrheic dermatitis 05/22/2020   BPPV (benign paroxysmal positional vertigo), right 02/18/2020   Hypertriglyceridemia 02/18/2020   Nausea 07/02/2019   Ingrown toenail of left foot 07/31/2018   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   Cervical radiculopathy 01/29/2018   Chronic neck pain 01/16/2018   Neck stiffness 08/20/2017   Numbness 08/20/2017   CKD (chronic kidney disease) stage 3, GFR 30-59 ml/min ([place]) 06/03/2017   Combined forms of age-related cataract of both eyes 05/27/2017   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   Posterior vitreous detachment of both eyes 05/27/2017   Elevated serum creatinine 04/23/2017   Pure hypercholesterolemia 04/22/2017   New daily persistent headache 10/22/2016   Dyslipidemia, goal LDL below 70 04/22/2016   Diverticulosis 12/20/2015   Gastritis and gastroduodenitis 93/71/6967   Umbilical hernia 89/38/1017   Type 2 diabetes mellitus with complication, without long-term current use of insulin ([place]) 51/12/5850   Metabolic syndrome X 77/82/4235   ESSENTIAL HYPERTENSION, BENIGN 03/27/2010   Dyslipidemia 01/19/2010   Asthma 01/19/2010    Conditions to be addressed/monitored:HTN, HLD, DMII, CKD Stage 3, and acute diverticulitis  Care Plan : RN Care Manager Plan of Care  Updates made by [name], RN since 01/02/2022 12:00 AM     Problem: Chronic Disease Management Education and/or Care Coordination needs.   Priority: High     Long-Range Goal: Development of plan of care for Chronic Disease Management and/or Care Coordination needs   Start Date: 10/17/2021  Expected End Date: 06/01/2022  Priority: High  Note:   Current Barriers: per chart being treated for Acute Diverticulitis: Cipro and Flagyl. Patient reports she has been taking medications as  prescribed. Although she reports decreased appetite and some diarrhea after starting antibiotic, she states improvement in abdominal discomfort and almost finished with antibiotics. Last couple of blood sugars have been 130 and 155, slightly higher than her norm. Reports since 12/29/21 blood sugar ranges 108-155 (with only two BS readings of 155). She reports she has not walked in couple of weeks due to knees- she reports she feels her knees catch when going up and down stairs-necessary to reach the location where she walks and she is resting her knees. She reports she will follow up with provider if it does not get better. She reports iron was also added and will follow up for labwork around 01/14/22.  She denies any falls.  She denies any questions or concerns at this time.  Knowledge Deficits related to plan of care for management of HTN, HLD, DMII, CKD Stage 3, and Asthma   RNCM Clinical Goal(s):  Patient will demonstrate improved adherence to prescribed treatment plan for HTN, HLD, DMII, CKD Stage 3, and Asthma as evidenced by daily monitoring and recording of CBG, improved  adherence to ADA/Care modified diet, exercise 3 days/week, adherence to prescribed meditation regimen  through collaboration with RN Care manager, provider, and care team.   Interventions: 1:1 collaboration with primary care provider regarding development and update of comprehensive plan of care as evidenced by provider attestation and co-signature Inter-disciplinary care team collaboration (see longitudinal plan of care) Evaluation of current treatment plan related to  self management and patient's adherence to plan as established by provider  Asthma: (Status:Goal on track:  Yes.) Long Term Goal Medications reviewed, encouraged to take medications as prescribed Encouraged to follow up with provider with any new signs or symptoms.   Diabetes Interventions:  (Status:  Goal on track:  Yes.) Long Term Goal Assessed patient's  understanding of A1c goal: <7% Encouraged patient to continue to take medications as prescribed Encouraged to continue to check blood sugar as recommended and contact provider if consistently out of range Lab Results  Component Value Date   HGBA1C 6.5 (A) 09/05/2021  Hyperlipidemia Interventions:  (Status:  Goal on track:  Yes.) Long Term Goal Medication review performed; medication list updated in electronic medical record.  Encouraged to attend provider visits as scheduled Encouraged to continue to Eat Healthy  Hypertension and CKD Interventions:  (Status:  Goal on track:  Yes.) Long Term Goal Home BP reading: 12/28/21 120/66 Home BP reading: 12/26/21 126/62   Last practice recorded BP readings:  BP Readings from Last 3 Encounters:  12/26/21 (!) 157/66  09/28/21 134/77  09/05/21 128/72  Most recent eGFR/CrCl:  Lab Results  Component Value Date   EGFR 49 (L) 12/26/2021    No components found for: CRCL  Reviewed medications with patient and discussed importance of compliance  Reviewed home BP readings with patient. Encouraged to continue to check 1-2 times/week and notify provider if consistently outside recommended parameters  Patient Goals/Self-Care Activities: Take medications as prescribed   Attend all scheduled provider appointments Call provider office for new concerns or questions  Continue to check and record blood pressure 1-2 times/week or more if blood pressure is out of normal range Continue to check and record blood sugar as recommended by your provider and if you feel it is too high or too low. Take record to your provider visits Continue to eat healthy: lean meats, fruits, vegetables healthy fats; low carbohydrate-limit sugars, rice, potatoes and pasta  Foods that can lower triglycerides-fatty fish, quinoa, avocado, coconut oil, garlic and cruciferous vegetables. Continue to remain active as possible. Contact provider if your knees do not improve or worsens Review  education on diverticulitis, call your RN Case Manager if you have any questions   Plan:Telephone follow up appointment with care management team member scheduled for:  01/30/22 The patient has been provided with contact information for the care management team and has been advised to call with any health related questions or concerns.   Thea Silversmith, RN, MSN, BSN, CCM Care Management Coordinator [facility] [phone]

## 2022-01-02 NOTE — Patient Instructions (Addendum)
Visit Information  Thank you for taking time to visit with me today. Please don't hesitate to contact me if I can be of assistance to you before our next scheduled telephone appointment.  Following are the goals we discussed today:  Patient Goals/Self-Care Activities: Take medications as prescribed   Attend all scheduled provider appointments Call provider office for new concerns or questions  Continue to check and record blood pressure 1-2 times/week or more if blood pressure is out of normal range Continue to check and record blood sugar as recommended by your provider and if you feel it is too high or too low. Take record to your provider visits Continue to eat healthy: lean meats, fruits, vegetables healthy fats; low carbohydrate-limit sugars, rice, potatoes and pasta  Foods that can lower triglycerides-fatty fish, quinoa, avocado, coconut oil, garlic and cruciferous vegetables. Continue to remain active as possible. Contact provider if your knees do not improve or worsens Review education on diverticulitis, call your RN Case Manager if you have any questions  Our next appointment is by telephone on 01/30/22 at 10:am  Please call the care guide team at [phone] if you need to cancel or reschedule your appointment.   If you are experiencing a Mental Health or [place] or need someone to talk to, please call the Suicide and Crisis Lifeline: 988 call 1-800-273-TALK (toll free, 24 hour hotline)   The patient verbalized understanding of instructions, educational materials, and care plan provided today and agreed to receive a mailed copy of patient instructions, educational materials, and care plan.   Thea Silversmith, RN, MSN, BSN, CCM Care Management Coordinator [facility] [phone]

## 2022-01-08 DIAGNOSIS — N1831 Chronic kidney disease, stage 3a: Secondary | ICD-10-CM | POA: Diagnosis not present

## 2022-01-08 DIAGNOSIS — I10 Essential (primary) hypertension: Secondary | ICD-10-CM

## 2022-01-08 DIAGNOSIS — E1122 Type 2 diabetes mellitus with diabetic chronic kidney disease: Secondary | ICD-10-CM

## 2022-01-23 ENCOUNTER — Ambulatory Visit: Payer: Self-pay | Admitting: Surgery

## 2022-01-23 DIAGNOSIS — K439 Ventral hernia without obstruction or gangrene: Secondary | ICD-10-CM | POA: Diagnosis not present

## 2022-01-23 NOTE — H&P (Signed)
? ? ?[name] ?[id number]  ? ?Referring Provider:  Iran Planas, PA ? ? ?Subjective  ? ?Chief Complaint: Hernia ?  ? ? ?History of Present Illness: ?   ?Very pleasant 81 year old woman with history of hypertension, hyperlipidemia, diabetes, asthma who presents with a longstanding umbilical hernia.  This has been present since the mid 80s and has never bothered her, even including to date.  It has recently begun to increase in size.  Denies any obstructive symptoms or associated pain. She reports a history of tubal ligation, and subsequently ovarian cystectomy with concomitant appendectomy.   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 ? ? ?Review of Systems: ?A complete review of systems was obtained from the patient.  I have reviewed this information and discussed as appropriate with the patient.  See HPI as well for other ROS. ? ? ?Medical History: ?Past Medical History:  ?Diagnosis Date  ? Anemia   ? Arthritis   ? Asthma, unspecified asthma severity, unspecified whether complicated, unspecified whether persistent   ? Diabetes mellitus without complication (CMS-HCC)   ? Hyperlipidemia   ? Hypertension   ? ? ?There is no problem list on file for this patient. ? ? ?Past Surgical History:  ?Procedure Laterality Date  ? laparscopic navel surgery N/A   ? ovary removed N/A   ?  ? ?Allergies  ?Allergen Reactions  ? Peanut Shortness Of Breath  ?  REACTION: Restricted breathing ?Restricted breathing ?  ? Guaifenesin Other (See Comments)  ?  Made blood sugar go up to 212.  ?Made blood sugar go up to 212.  ?Made blood sugar go up to 212.  ?  ? Pitavastatin Other (See Comments)  ?  Muscle cramps ?Muscle cramps ?  ? Pravastatin Other (See Comments)  ?  Muscle cramps  ? Prednisone Other (See Comments)   ?  Bloating and abdominal pain.  ?Bloating and abdominal pain, with oral product only. ?  ? Simvastatin Other (See Comments)  ?  Muscle cramps ?Muscle cramps ?  ? Amlodipine Other (See Comments)  ?  Dizziness  ? Tramadol Other (See Comments)  ?  Somnolence.   ? ? ?Current Outpatient Medications on File Prior to Visit  ?Medication Sig Dispense Refill  ? albuterol 90 mcg/actuation inhaler INHALE 2 PUFFS BY MOUTH ONCE DAILY AS DIRECTED    ? rosuvastatin (CRESTOR) 20 MG tablet Take 1 tablet by mouth once daily    ? cholecalciferol, vitamin D3, (CHOLECALCIFEROL, VIT D3,,BULK,) 100,000 unit/gram Powd Take by mouth once daily    ? ferrous sulfate 325 (65 FE) MG tablet Take 325 mg by mouth daily with breakfast    ? lisinopriL-hydrochlorothiazide (ZESTORETIC) 20-25 mg tablet     ? omega-3 fatty acids-fish oil 360-1,200 mg Cap Take 1 capsule by mouth once daily    ? ?No current facility-administered medications on file prior to visit.  ? ? ?Family History  ?Problem Relation Age of Onset  ? Obesity Mother   ? Diabetes Mother   ? Deep vein thrombosis (DVT or abnormal blood clot formation) Mother   ? Heart valve disease Father   ?  ? ?Social History  ? ?Tobacco Use  ?Smoking Status Former  ? Types: Cigarettes  ?Smokeless Tobacco Never  ?  ? ?  Social History  ? ?Socioeconomic History  ? Marital status: Widowed  ?Tobacco Use  ? Smoking status: Former  ?  Types: Cigarettes  ? Smokeless tobacco: Never  ?Vaping Use  ? Vaping Use: Never used  ?Substance and Sexual Activity  ? Alcohol use: Never  ? Drug use: Never  ? ? ?Objective:  ? ? ?Vitals:  ? 01/23/22 1122  ?BP: (!) 152/80  ?Pulse: 82  ?Temp: 36.7 ?C (98 ?F)  ?SpO2: 97%  ?Weight: 76.1 kg (167 lb 12.8 oz)  ?Height: 157.5 cm ('5\' 2"'$ )  ?  ?Body mass index is 30.69 kg/m?. ? ?Alert and well-appearing ?Unlabored respirations ?Abdomen is soft, obese, nontender, nondistended.  There is a large chronically incarcerated umbilical hernia containing fat.  Fascial defect is not palpable, but on  CT scan last month defect is visible and measures approximately 2.5 to 3 cm in diameter. ? ? ?Assessment and Plan:  ?Diagnoses and all orders for this visit: ? ?Ventral hernia without obstruction or gangrene ? ?We discussed the relevant anatomy and natural history of ventral hernias.  Discussed options for repair, and in her case I would recommend an open repair likely with mesh given the size of the hernia defect.  We discussed the indications for mesh and very small risk of issues like mesh infection, mesh erosion, or fistula and expect that all to be extremely unlikely.  Also discussed general risks of umbilical hernia repair including bleeding, infection, pain, scarring, injury to intra-abdominal structures, wound healing problems, hematoma/seroma, hernia recurrence.  Discussed that the recurrence rate is much higher in the setting of a hernia larger than 1.5 cm if mesh is not used.  We also discussed the option of ongoing observation with the possible outcomes of no additional change to the hernia, versus it could become larger, more symptomatic, or could lead to bowel incarceration.  Currently the hernia defect is plugged with omentum and so bowel incarceration risk at this time is very low.  Questions were welcomed and answered to her satisfaction.  I do recommend proceeding with repair and we will proceed with the scheduling process, but counseled the patient that it is completely her decision as long as she understands the risks and benefits of nonoperative treatment and the possibility that she could end up needing to have surgery later in life.  She is tentatively agreeable to proceed with surgery. ? ?Inas Avena Raquel James, MD  ? ? ? ? ? ?

## 2022-01-28 ENCOUNTER — Ambulatory Visit (INDEPENDENT_AMBULATORY_CARE_PROVIDER_SITE_OTHER): Payer: Medicare HMO | Admitting: Physician Assistant

## 2022-01-28 DIAGNOSIS — Z1231 Encounter for screening mammogram for malignant neoplasm of breast: Secondary | ICD-10-CM

## 2022-01-28 DIAGNOSIS — Z0000 Encounter for general adult medical examination without abnormal findings: Secondary | ICD-10-CM

## 2022-01-28 NOTE — Patient Instructions (Addendum)
?MEDICARE ANNUAL WELLNESS VISIT ?Health Maintenance Summary and Written Plan of Care ? ?Lindsey Fischer , ? ?Thank you for allowing me to perform your Medicare Annual Wellness Visit and for your ongoing commitment to your health.  ? ?Health Maintenance & Immunization History ?Health Maintenance  ?Topic Date Due  ? FOOT EXAM  01/29/2022 (Originally 12/06/2021)  ? COVID-19 Vaccine (3 - Booster for Pfizer series) 02/13/2022 (Originally 06/05/2020)  ? Zoster Vaccines- Shingrix (1 of 2) 04/30/2022 (Originally 08/15/1991)  ? MAMMOGRAM  01/29/2023 (Originally 11/16/2021)  ? TETANUS/TDAP  03/05/2022  ? HEMOGLOBIN A1C  03/06/2022  ? OPHTHALMOLOGY EXAM  11/08/2022  ? Pneumonia Vaccine 53+ Years old  Completed  ? INFLUENZA VACCINE  Completed  ? DEXA SCAN  Completed  ? HPV VACCINES  Aged Out  ? ?Immunization History  ?Administered Date(s) Administered  ? Fluad Quad(high Dose 65+) 11/15/2019, 08/28/2020, 09/05/2021  ? Influenza Whole 09/11/2010  ? Influenza,inj,Quad PF,6+ Mos 07/16/2013, 07/24/2015, 08/18/2017, 08/03/2018  ? Influenza-Unspecified 07/31/2012, 08/04/2014, 08/25/2016  ? PFIZER(Purple Top)SARS-COV-2 Vaccination 03/20/2020, 04/10/2020  ? Pneumococcal Conjugate-13 01/20/2015  ? Pneumococcal Polysaccharide-23 09/11/2010  ? Tdap 03/05/2012  ? ? ?These are the patient goals that we discussed: ? Goals Addressed   ? ?  ?  ?  ?  ?  ? This Visit's Progress  ?   Patient Stated (pt-stated)     ?   Patient would like to loose 10 lbs. ?  ? ?  ?  ? ?This is a list of Health Maintenance Items that are overdue or due now: ?Screening mammography ?Shingrix vaccine ?Foot exam  ? ?Orders/Referrals Placed Today: ?Orders Placed This Encounter  ?Procedures  ? Mammogram 3D SCREEN BREAST BILATERAL  ?  Standing Status:   Future  ?  Standing Expiration Date:   01/29/2023  ?  Scheduling Instructions:  ?   Please call patient to schedule.  ?  Order Specific Question:   Reason for Exam (SYMPTOM  OR DIAGNOSIS REQUIRED)  ?  Answer:   breast cancer screening   ?  Order Specific Question:   Preferred imaging location?  ?  Answer:   [facility]  ? ?(Contact our referral department at [phone] if you have not spoken with someone about your referral appointment within the next 5 days)  ? ? ?Follow-up Plan ?Follow-up with [name], PA-C as planned ?Schedule your shingrix vaccine at your pharmacy. ?Foot exam can be done at your next visit with PCP. ?AVS printed and mailed to the patient. ? ? ? ?  ?Health Maintenance, Female ?Adopting a healthy lifestyle and getting preventive care are important in promoting health and wellness. Ask your health care provider about: ?The right schedule for you to have regular tests and exams. ?Things you can do on your own to prevent diseases and keep yourself healthy. ?What should I know about diet, weight, and exercise? ?Eat a healthy diet ? ?Eat a diet that includes plenty of vegetables, fruits, low-fat dairy products, and lean protein. ?Do not eat a lot of foods that are high in solid fats, added sugars, or sodium. ?Maintain a healthy weight ?Body mass index (BMI) is used to identify weight problems. It estimates body fat based on height and weight. Your health care provider can help determine your BMI and help you achieve or maintain a healthy weight. ?Get regular exercise ?Get regular exercise. This is one of the most important things you can do for your health. Most adults should: ?Exercise for at least 150 minutes each week.  The exercise should increase your heart rate and make you sweat (moderate-intensity exercise). ?Do strengthening exercises at least twice a week. This is in addition to the moderate-intensity exercise. ?Spend less time sitting. Even light physical activity can be beneficial. ?Watch cholesterol and blood lipids ?Have your blood tested for lipids and cholesterol at 81 years of age, then have this test every 5 years. ?Have your cholesterol levels checked more often if: ?Your lipid or cholesterol  levels are high. ?You are older than 81 years of age. ?You are at high risk for heart disease. ?What should I know about cancer screening? ?Depending on your health history and family history, you may need to have cancer screening at various ages. This may include screening for: ?Breast cancer. ?Cervical cancer. ?Colorectal cancer. ?Skin cancer. ?Lung cancer. ?What should I know about heart disease, diabetes, and high blood pressure? ?Blood pressure and heart disease ?High blood pressure causes heart disease and increases the risk of stroke. This is more likely to develop in people who have high blood pressure readings or are overweight. ?Have your blood pressure checked: ?Every 3-5 years if you are 74-69 years of age. ?Every year if you are 41 years old or older. ?Diabetes ?Have regular diabetes screenings. This checks your fasting blood sugar level. Have the screening done: ?Once every three years after age 78 if you are at a normal weight and have a low risk for diabetes. ?More often and at a younger age if you are overweight or have a high risk for diabetes. ?What should I know about preventing infection? ?Hepatitis B ?If you have a higher risk for hepatitis B, you should be screened for this virus. Talk with your health care provider to find out if you are at risk for hepatitis B infection. ?Hepatitis C ?Testing is recommended for: ?Everyone born from [birth year] through 1965. ?Anyone with known risk factors for hepatitis C. ?Sexually transmitted infections (STIs) ?Get screened for STIs, including gonorrhea and chlamydia, if: ?You are sexually active and are younger than 81 years of age. ?You are older than 81 years of age and your health care provider tells you that you are at risk for this type of infection. ?Your sexual activity has changed since you were last screened, and you are at increased risk for chlamydia or gonorrhea. Ask your health care provider if you are at risk. ?Ask your health care provider about  whether you are at high risk for HIV. Your health care provider may recommend a prescription medicine to help prevent HIV infection. If you choose to take medicine to prevent HIV, you should first get tested for HIV. You should then be tested every 3 months for as long as you are taking the medicine. ?Pregnancy ?If you are about to stop having your period (premenopausal) and you may become pregnant, seek counseling before you get pregnant. ?Take 400 to 800 micrograms (mcg) of folic acid every day if you become pregnant. ?Ask for birth control (contraception) if you want to prevent pregnancy. ?Osteoporosis and menopause ?Osteoporosis is a disease in which the bones lose minerals and strength with aging. This can result in bone fractures. If you are 54 years old or older, or if you are at risk for osteoporosis and fractures, ask your health care provider if you should: ?Be screened for bone loss. ?Take a calcium or vitamin D supplement to lower your risk of fractures. ?Be given hormone replacement therapy (HRT) to treat symptoms of menopause. ?Follow these instructions at home: ?Alcohol  use ?Do not drink alcohol if: ?Your health care provider tells you not to drink. ?You are pregnant, may be pregnant, or are planning to become pregnant. ?If you drink alcohol: ?Limit how much you have to: ?0-1 drink a day. ?Know how much alcohol is in your drink. In the [place] one drink equals one 12 oz bottle of beer (355 mL), one 5 oz glass of wine (148 mL), or one 1? oz glass of hard liquor (44 mL). ?Lifestyle ?Do not use any products that contain nicotine or tobacco. These products include cigarettes, chewing tobacco, and vaping devices, such as e-cigarettes. If you need help quitting, ask your health care provider. ?Do not use street drugs. ?Do not share needles. ?Ask your health care provider for help if you need support or information about quitting drugs. ?General instructions ?Schedule regular health, dental, and eye  exams. ?Stay current with your vaccines. ?Tell your health care provider if: ?You often feel depressed. ?You have ever been abused or do not feel safe at home. ?Summary ?Adopting a healthy lifestyle and getting p

## 2022-01-28 NOTE — Progress Notes (Signed)
? ? ?MEDICARE ANNUAL WELLNESS VISIT ? ?01/28/2022 ? ?Telephone Visit Disclaimer ?This Medicare AWV was conducted by telephone due to national recommendations for restrictions regarding the COVID-19 Pandemic (e.g. social distancing).  I verified, using two identifiers, that I am speaking with Lindsey Fischer or their authorized healthcare agent. I discussed the limitations, risks, security, and privacy concerns of performing an evaluation and management service by telephone and the potential availability of an in-person appointment in the future. The patient expressed understanding and agreed to proceed.  ?Location of Patient: Home ?Location of Provider (nurse):  In the office. ? ?Subjective:  ? ? ?Lindsey Fischer is a 81 y.o. female patient of [name], PA-C who had a [employer] Visit today via telephone. Lindsey Fischer is Retired and lives alone. she has 3 children. she reports that she is socially active and does interact with friends/family regularly. she is minimally physically active and enjoys reads a lot and goes to church often. ? ?Patient Care Team: ?Lavada Mesi as PCP - General (Family Medicine) ?[name], RN as Case Manager ? ?Advanced Directives 01/28/2022 12/08/2020 09/09/2017  ?Does Patient Have a Medical Advance Directive? No Yes No  ?Type of Advance Directive - [place];Living will -  ?Does patient want to make changes to medical advance directive? - No - Patient declined -  ?Copy of [place] in Chart? - No - copy requested -  ?Would patient like information on creating a medical advance directive? No - Patient declined - Yes (ED - Information included in AVS)  ? ? ?Hospital Utilization Over the Past 12 Months: ?# of hospitalizations or ER visits: 0 ?# of surgeries: 0 ? ?Review of Systems    ?Patient reports that her overall health is unchanged compared to last year. ? ?History obtained from chart review and the patient ? ?Patient  Reported Readings (BP, Pulse, CBG, Weight, etc) ?none ? ?Pain Assessment ?Pain : No/denies pain ? ?  ? ?Current Medications & Allergies (verified) ?Allergies as of 01/28/2022   ? ?   Reactions  ? Peanut-containing Drug Products Shortness Of Breath  ? Restricted breathing  ? Livalo [pitavastatin] Other (See Comments)  ? Muscle cramps  ? Mucinex [guaifenesin Er] Other (See Comments)  ? Made blood sugar go up to 212.   ? Prednisone Other (See Comments)  ? Bloating and abdominal pain, with oral product only.  ? Zocor [simvastatin] Other (See Comments)  ? Muscle cramps  ? Amlodipine Other (See Comments)  ? Dizziness  ? Tramadol   ? Somnolence.   ? ?  ? ?  ?Medication List  ?  ? ?  ? Accurate as of January 28, 2022 11:04 AM. If you have any questions, ask your nurse or doctor.  ?  ?  ? ?  ? ?albuterol 108 (90 Base) MCG/ACT inhaler ?Commonly known as: VENTOLIN HFA ?INHALE 2 PUFFS BY MOUTH ONCE DAILY AS DIRECTED ?  ?AMBULATORY NON FORMULARY MEDICATION ?2 (two) times daily. Osteobiflex ?  ?AMBULATORY NON FORMULARY MEDICATION ?Nebulizer machine and tubing ? ?Dx:  COPD exacerbation ?  ?B-D SINGLE USE SWABS REGULAR Pads ?1 application by Does not apply route daily as needed (to use with injections). ?  ?ferrous sulfate 325 (65 FE) MG tablet ?Take 325 mg by mouth daily with breakfast. ?  ?fexofenadine 180 MG tablet ?Commonly known as: Allegra Allergy ?Take 1 tablet (180 mg total) by mouth daily for 15 days. ?  ?Fish Oil  [place] ?Take 1 capsule by mouth daily. ?  ?Fluticasone-Salmeterol 250-50 MCG/DOSE Aepb ?Commonly known as: W[place]ela Inhub ?INHALE 1 PUFF INTO THE LUNGS EVERY 12 HOURS ?  ?fluticasone-salmeterol 250-50 MCG/ACT Aepb ?Commonly known as: Wixela Inhub ?Inhale 1 puff into the lungs in the morning and at bedtime. ?  ?ipratropium-albuterol 0.5-2.5 (3) MG/3ML Soln ?Commonly known as: DUONEB ?Take 3 mLs by nebulization every 6 (six) hours as needed. ?  ?lisinopril-hydrochlorothiazide 20-25 MG tablet ?Commonly known as:  ZESTORETIC ?TAKE 1 TABLET EVERY DAY ?  ?metFORMIN 500 MG tablet ?Commonly known as: GLUCOPHAGE ?Take 1 tablet (500 mg total) by mouth 2 (two) times daily with a meal. ?  ?montelukast 10 MG tablet ?Commonly known as: SINGULAIR ?Take 1 tablet (10 mg total) by mouth at bedtime. ?  ?rosuvastatin 20 MG tablet ?Commonly known as: CRESTOR ?TAKE 1 TABLET EVERY DAY ?  ?True Metrix Blood Glucose Test test strip ?Generic drug: glucose blood ?TEST BLOOD SUGAR TWICE DAILY AS NEEDED ?  ?True Metrix Meter w/Device Kit ?USE AS DIRECTED ?  ?TRUEplus Lancets 33G Misc ?TEST BLOOD SUGAR TWICE DAILY AS NEEDED ?  ?Vitamin D3 100000 UNIT/GM Powd ?Take by mouth. ?  ? ?  ? ? ?History (reviewed): ?Past Medical History:  ?Diagnosis Date  ? Asthma   ? Combined forms of age-related cataract of both eyes 05/27/2017  ? Diabetes mellitus   ? pre diabetes  ? Hyperlipidemia   ? Hypertension   ? 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 Posterior vitreous detachment of both eyes 05/27/2017  ? ?Past Surgical History:  ?Procedure Laterality Date  ? APPENDECTOMY    ? CATARACT EXTRACTION Right   ? OVARIAN CYST REMOVAL    ? ?Family History  ?Problem Relation Age of Onset  ? Diabetes Mother   ? Heart attack Father 76  ?     deceased  ? Cancer Sister   ?     brain tumor  ? Cancer Brother   ?     prostrate and lung CA  ? COPD Sister   ? ?Social History  ? ?Socioeconomic History  ? Marital status: Widowed  ?  Spouse name: Not on file  ? Number of children: 3  ? Years of education: 15  ? Highest education level: 12th grade  ?Occupational History  ? Occupation: CNA  ?  Comment: retired  ?Tobacco Use  ? Smoking status: Former  ?  Types: Cigarettes  ?  Quit date: 11/11/1980  ?  Years since quitting: 41.2  ? Smokeless tobacco: Never  ?Substance and Sexual Activity  ? Alcohol use: No  ? Drug use: No  ? Sexual activity: Never  ?  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  ?Other Topics Concern  ? Not on file  ?Social History Narrative  ? Lives  home alone, widowed.  Works at [employer].  Education 12th grade.  Children 3.    ? 07/11/21 Continues to live home alone. Reports no longer is working.  ? 01/28/22- reads a lot and goes to church often.  ? ?Social Determinants of Health  ? ?Financial Resource Strain: Low Risk   ? Difficulty of Paying Living Expenses: Not hard at all  ?Food Insecurity: No Food Insecurity  ? Worried About Charity fundraiser in the Last Year: Never true  ? Ran Out of Food in the Last Year: Never true  ?Transportation Needs: No Transportation Needs  ? Lack of Transportation (Medical): No  ?  Lack of Transportation (Non-Medical): No  ?Physical Activity: Inactive  ? Days of Exercise per Week: 0 days  ? Minutes of Exercise per Session: 0 min  ?Stress: No Stress Concern Present  ? Feeling of Stress : Not at all  ?Social Connections: Moderately Integrated  ? Frequency of Communication with Friends and Family: Three times a week  ? Frequency of Social Gatherings with Friends and Family: More than three times a week  ? Attends Religious Services: More than 4 times per year  ? Active Member of Clubs or Organizations: Yes  ? Attends Archivist Meetings: More than 4 times per year  ? Marital Status: Widowed  ? ? ?Activities of Daily Living ?In your present state of health, do you have any difficulty performing the following activities: 01/28/2022  ?Hearing? N  ?Vision? N  ?Difficulty concentrating or making decisions? N  ?Walking or climbing stairs? N  ?Dressing or bathing? N  ?Doing errands, shopping? N  ?Preparing Food and eating ? N  ?Using the Toilet? N  ?In the past six months, have you accidently leaked urine? Y  ?Comment sometimes.  ?Do you have problems with loss of bowel control? N  ?Managing your Medications? N  ?Managing your Finances? N  ?Housekeeping or managing your Housekeeping? N  ?Some recent data might be hidden  ? ? ?Patient Education/ Literacy ?How often do you need to have someone help you when you read  instructions, pamphlets, or other written materials from your doctor or pharmacy?: 1 - Never ?What is the last grade level you completed in school?: 12th grade ? ?Exercise ?Current Exercise Habits: The patient does not

## 2022-01-30 ENCOUNTER — Ambulatory Visit (INDEPENDENT_AMBULATORY_CARE_PROVIDER_SITE_OTHER): Payer: Medicare HMO

## 2022-01-30 DIAGNOSIS — I10 Essential (primary) hypertension: Secondary | ICD-10-CM

## 2022-01-30 DIAGNOSIS — N1831 Chronic kidney disease, stage 3a: Secondary | ICD-10-CM

## 2022-01-30 NOTE — Patient Instructions (Signed)
Visit Information ? ?Thank you for taking time to visit with me today. Please don't hesitate to contact me if I can be of assistance to you before our next scheduled telephone appointment. ? ?Following are the goals we discussed today:  ?Patient Goals/Self-Care Activities: ?Take medications as prescribed   ?Attend all scheduled provider appointments ?Call provider office for new concerns or questions  ?Continue to check and record blood pressure 1-2 times/week or more if blood pressure is out of normal range ?Continue to check and record blood sugar as recommended by your provider and if you feel it is too high or too low. Take record to your provider visits ?Continue to eat healthy: lean meats, fruits, vegetables healthy fats; low carbohydrate-limit sugars, rice, potatoes and pasta. Foods that can lower triglycerides-fatty fish, quinoa, avocado, coconut oil, garlic and cruciferous vegetables. ?Continue to remain active as possible ? ?Our next appointment is by telephone on 02/27/22 at 11:00 am ? ?Please call the care guide team at [phone] if you need to cancel or reschedule your appointment.  ? ?If you are experiencing a Mental Health or [place] or need someone to talk to, please call the Suicide and Crisis Lifeline: 988 ?call 1-800-273-TALK (toll free, 24 hour hotline)  ? ?The patient verbalized understanding of instructions, educational materials, and care plan provided today and agreed to receive a mailed copy of patient instructions, educational materials, and care plan.  ? ?Thea Silversmith, RN, MSN, BSN, CCM ?Care Management Coordinator ?[facility] ?[phone]  ? ? ?Asthma, Adult ?Asthma is a long-term (chronic) condition in which the airways get tight and narrow. The airways are the breathing passages that lead from the nose and mouth down into the lungs. A person with asthma will have times when symptoms get worse. These are called asthma attacks. They can cause coughing,  whistling sounds when you breathe (wheezing), shortness of breath, and chest pain. They can make it hard to breathe. There is no cure for asthma, but medicines and lifestyle changes can help control it. ?There are many things that can bring on an asthma attack or make asthma symptoms worse (triggers). Common triggers include: ?Mold. ?Dust. ?Cigarette smoke. ?Cockroaches. ?Things that can cause allergy symptoms (allergens). These include animal skin flakes (dander) and pollen from trees or grass. ?Things that pollute the air. These may include household cleaners, wood smoke, smog, or chemical odors. ?Cold air, weather changes, and wind. ?Crying or laughing hard. ?Stress. ?Certain medicines or drugs. ?Certain foods such as dried fruit, potato chips, and grape juice. ?Infections, such as a cold or the flu. ?Certain medical conditions or diseases. ?Exercise or tiring activities. ?Asthma may be treated with medicines and by staying away from the things that cause asthma attacks. Types of medicines may include: ?Controller medicines. These help prevent asthma symptoms. They are usually taken every day. ?Fast-acting reliever or rescue medicines. These quickly relieve asthma symptoms. They are used as needed and provide short-term relief. ?Allergy medicines if your attacks are brought on by allergens. ?Medicines to help control the body's defense (immune) system. ?Follow these instructions at home: ?Avoiding triggers in your home ?Change your heating and air conditioning filter often. ?Limit your use of fireplaces and wood stoves. ?Get rid of pests (such as roaches and mice) and their droppings. ?Throw away plants if you see mold on them. ?Clean your floors. Dust regularly. Use cleaning products that do not smell. ?Have someone vacuum when you are not home. Use a vacuum cleaner with a HEPA filter  if possible. ?Replace carpet with wood, tile, or vinyl flooring. Carpet can trap animal skin flakes and dust. ?Use allergy-proof  pillows, mattress covers, and box spring covers. ?Wash bed sheets and blankets every week in hot water. Dry them in a dryer. ?Keep your bedroom free of any triggers. ?Avoid pets and keep windows closed when things that cause allergy symptoms are in the air. ?Use blankets that are made of polyester or cotton. ?Clean bathrooms and kitchens with bleach. If possible, have someone repaint the walls in these rooms with mold-resistant paint. Keep out of the rooms that are being cleaned and painted. ?Wash your hands often with soap and water. If soap and water are not available, use hand sanitizer. ?Do not allow anyone to smoke in your home. ?General instructions ?Take over-the-counter and prescription medicines only as told by your doctor. ?Talk with your doctor if you have questions about how or when to take your medicines. ?Make note if you need to use your medicines more often than usual. ?Do not use any products that contain nicotine or tobacco, such as cigarettes and e-cigarettes. If you need help quitting, ask your doctor. ?Stay away from secondhand smoke. ?Avoid doing things outdoors when allergen counts are high and when air quality is low. ?Wear a ski mask when doing outdoor activities in the winter. The mask should cover your nose and mouth. Exercise indoors on cold days if you can. ?Warm up before you exercise. Take time to cool down after exercise. ?Use a peak flow meter as told by your doctor. A peak flow meter is a tool that measures how well the lungs are working. ?Keep track of the peak flow meter's readings. Write them down. ?Follow your asthma action plan. This is a written plan for taking care of your asthma and treating your attacks. ?Make sure you get all the shots (vaccines) that your doctor recommends. Ask your doctor about a flu shot and a pneumonia shot. ?Keep all follow-up visits as told by your doctor. This is important. ?Contact a doctor if: ?You have wheezing, shortness of breath, or a cough  even while taking medicine to prevent attacks. ?The mucus you cough up (sputum) is thicker than usual. ?The mucus you cough up changes from clear or white to yellow, green, gray, or bloody. ?You have problems from the medicine you are taking, such as: ?A rash. ?Itching. ?Swelling. ?Trouble breathing. ?You need reliever medicines more than 2-3 times a week. ?Your peak flow reading is still at 50-79% of your personal best after following the action plan for 1 hour. ?You have a fever. ?Get help right away if: ?You seem to be worse and are not responding to medicine during an asthma attack. ?You are short of breath even at rest. ?You get short of breath when doing very little activity. ?You have trouble eating, drinking, or talking. ?You have chest pain or tightness. ?You have a fast heartbeat. ?Your lips or fingernails start to turn blue. ?You are light-headed or dizzy, or you faint. ?Your peak flow is less than 50% of your personal best. ?You feel too tired to breathe normally. ?Summary ?Asthma is a long-term (chronic) condition in which the airways get tight and narrow. An asthma attack can make it hard to breathe. ?Asthma cannot be cured, but medicines and lifestyle changes can help control it. ?Make sure you understand how to avoid triggers and how and when to use your medicines. ?This information is not intended to replace advice given to you by  your health care provider. Make sure you discuss any questions you have with your health care provider. ?Document Revised: 02/20/2020 Document Reviewed: 03/01/2020 ?Elsevier Patient Education ? [place]. ? ? ?Albuterol Metered Dose Inhaler (MDI) ?What is this medication? ?ALBUTEROL (al [place]) treats lung diseases, such as asthma, where the airways in the lungs narrow, causing breathing problems or wheezing (bronchospasm). It is also used to treat asthma or prevent breathing problems during exercise. This medication works by opening the airways of the lungs,  making it easier to breathe. It is often called a rescue- or quick-relief inhaler. ?This medicine may be used for other purposes; ask your health care provider or pharmacist if you have questions. ?COMMON

## 2022-01-30 NOTE — Chronic Care Management (AMB) (Signed)
?Chronic Care Management  ? ?CCM RN Visit Note ? ?01/30/2022 ?Name: Lindsey Fischer MRN: [id number] DOB: [birth year] ? ?Subjective: ?Lindsey Fischer is a 81 y.o. year old female who is a primary care patient of Lindsey Fischer, [place]. The care management team was consulted for assistance with disease management and care coordination needs.   ? ?Engaged with patient by telephone for follow up visit in response to provider referral for case management and/or care coordination services.  ? ?Consent to Services:  ?The patient was given information about Chronic Care Management services, agreed to services, and gave verbal consent prior to initiation of services.  Please see initial visit note for detailed documentation.  ? ?Patient agreed to services and verbal consent obtained.  ? ?Assessment: Review of patient past medical history, allergies, medications, health status, including review of consultants reports, laboratory and other test data, was performed as part of comprehensive evaluation and provision of chronic care management services.  ? ?SDOH (Social Determinants of Health) assessments and interventions performed:   ? ?CCM Care Plan ? ?Allergies  ?Allergen Reactions  ? Peanut-Containing Drug Products Shortness Of Breath  ?  Restricted breathing  ? Livalo [Pitavastatin] Other (See Comments)  ?  Muscle cramps  ? Mucinex [Guaifenesin Er] Other (See Comments)  ?  Made blood sugar go up to 212.   ? Prednisone Other (See Comments)  ?  Bloating and abdominal pain, with oral product only.  ? Zocor [Simvastatin] Other (See Comments)  ?  Muscle cramps ?  ? Amlodipine Other (See Comments)  ?  Dizziness ?  ? Tramadol   ?  Somnolence.   ? ? ?Outpatient Encounter Medications as of 01/30/2022  ?Medication Sig Note  ? albuterol (VENTOLIN HFA) 108 (90 Base) MCG/ACT inhaler INHALE 2 PUFFS BY MOUTH ONCE DAILY AS DIRECTED   ? Cholecalciferol (VITAMIN D3) 100000 UNIT/GM POWD Take by mouth.   ? ferrous sulfate 325 (65 FE) MG tablet Take  325 mg by mouth daily with breakfast.   ? fluticasone-salmeterol (WIXELA INHUB) 250-50 MCG/ACT AEPB Inhale 1 puff into the lungs in the morning and at bedtime.   ? ipratropium-albuterol (DUONEB) 0.5-2.5 (3) MG/3ML SOLN Take 3 mLs by nebulization every 6 (six) hours as needed.   ? lisinopril-hydrochlorothiazide (ZESTORETIC) 20-25 MG tablet TAKE 1 TABLET EVERY DAY   ? metFORMIN (GLUCOPHAGE) 500 MG tablet Take 1 tablet (500 mg total) by mouth 2 (two) times daily with a meal.   ? montelukast (SINGULAIR) 10 MG tablet Take 1 tablet (10 mg total) by mouth at bedtime.   ? Omega-3 Fatty Acids (FISH OIL) 1200 MG CAPS Take 1 capsule by mouth daily. 01/02/2022: Reports takes every other day ?  ? rosuvastatin (CRESTOR) 20 MG tablet TAKE 1 TABLET EVERY DAY   ? Alcohol Swabs (B-D SINGLE USE SWABS REGULAR) PADS 1 application by Does not apply route daily as needed (to use with injections).   ? AMBULATORY NON FORMULARY MEDICATION Nebulizer machine and tubing ? ?Dx:  COPD exacerbation   ? AMBULATORY NON FORMULARY MEDICATION 2 (two) times daily. Osteobiflex 07/11/2021: Reports takes once a day  ? Blood Glucose Monitoring Suppl (TRUE METRIX METER) w/Device KIT USE AS DIRECTED   ? fexofenadine (ALLEGRA ALLERGY) 180 MG tablet Take 1 tablet (180 mg total) by mouth daily for 15 days. (Patient not taking: Reported on 01/28/2022)   ? Fluticasone-Salmeterol (WIXELA INHUB) 250-50 MCG/DOSE AEPB INHALE 1 PUFF INTO THE LUNGS EVERY 12 HOURS   ? TRUE METRIX BLOOD GLUCOSE  TEST test strip TEST BLOOD SUGAR TWICE DAILY AS NEEDED   ? TRUEplus Lancets 33G MISC TEST BLOOD SUGAR TWICE DAILY AS NEEDED   ? ?Facility-Administered Encounter Medications as of 01/30/2022  ?Medication  ? iopamidol (ISOVUE-300) 61 % injection 80 mL  ? ? ?Patient Active Problem List  ? Diagnosis Date Noted  ? Diverticulitis 12/28/2021  ? Left lower quadrant abdominal pain 12/26/2021  ? Hypertension associated with diabetes ([place]) 06/11/2021  ? Age-related cataract of left eye 03/06/2021   ? Rib pain on left side 12/08/2020  ? Atopic dermatitis 08/28/2020  ? Seborrheic dermatitis 05/22/2020  ? BPPV (benign paroxysmal positional vertigo), right 02/18/2020  ? Hypertriglyceridemia 02/18/2020  ? Nausea 07/02/2019  ? Ingrown toenail of left foot 07/31/2018  ? 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 Cervical radiculopathy 01/29/2018  ? Chronic neck pain 01/16/2018  ? Neck stiffness 08/20/2017  ? Numbness 08/20/2017  ? CKD (chronic kidney disease) stage 3, GFR 30-59 ml/min (HCC) 06/03/2017  ? Combined forms of age-related cataract of both eyes 05/27/2017  ? 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 Posterior vitreous detachment of both eyes 05/27/2017  ? Elevated serum creatinine 04/23/2017  ? Pure hypercholesterolemia 04/22/2017  ? New daily persistent headache 10/22/2016  ? Dyslipidemia, goal LDL below 70 04/22/2016  ? Diverticulosis 12/20/2015  ? Gastritis and gastroduodenitis 12/20/2015  ? Umbilical hernia 99/35/7017  ? Type 2 diabetes mellitus with complication, without long-term current use of insulin ([place]) 11/15/2012  ? Metabolic syndrome X 79/39/0300  ? ESSENTIAL HYPERTENSION, BENIGN 03/27/2010  ? Dyslipidemia 01/19/2010  ? Asthma 01/19/2010  ? ? ?Conditions to be addressed/monitored:HTN, HLD, DMII, and CKD Stage 3. ? ?Care Plan : RN Care Manager Plan of Care  ?Updates made by Lindsey Rued, RN since 01/30/2022 12:00 AM  ?  ? ?Problem: Chronic Disease Management Education and/or Care Coordination needs.   ?Priority: High  ?  ? ?Long-Range Goal: Development of plan of care for Chronic Disease Management and/or Care Coordination needs   ?Start Date: 10/17/2021  ?Expected End Date: 06/01/2022  ?Priority: High  ?Note:   ?Current Barriers: Lindsey Fischer states she is doing well. She remains active, cut the grass last week and states had to use the nebulizer machine after cutting her grass. She states she uses albuterol inhaler once a day in addition to scheduled medications. Denies any  problems at this time. Medications reviewed and reports she has all prescribed medication. Blood sugar this morning was 152 ac, which surprised her that it was "that high". She reports normal fasting blood sugar 125-135. She reports she had potato salad for dinner and a few grapes before bed. She did not check her blood sugar before bed last night. and usually checks her blood sugar twice a day. She continues to walk, but states has had to other things during her designated walk time for the past two weeks. She states she will "get back to walking".   ?Knowledge Deficits related to plan of care for management of HTN, HLD, DMII, CKD Stage 3, and Asthma  ? ?RNCM Clinical Goal(s):  ?Patient will demonstrate improved adherence to prescribed treatment plan for HTN, HLD, DMII, CKD Stage 3, and Asthma as evidenced by daily monitoring and recording of CBG, improved adherence to ADA/Care modified diet, exercise 3 days/week, adherence to prescribed meditation regimen  through collaboration with RN Care manager, provider, and care team.  ? ?Interventions: ?1:1 collaboration with primary care provider regarding development and update of comprehensive  plan of care as evidenced by provider attestation and co-signature ?Inter-disciplinary care team collaboration (see longitudinal plan of care) ?Evaluation of current treatment plan related to  self management and patient's adherence to plan as established by provider ? ?Asthma: (Status:Goal on track:  Yes.) Long Term Goal ?Medications reviewed, encouraged to take medications as prescribed ?Encouraged to follow up with provider with any new signs or symptoms ?Note and Monitor triggers: use dust mask when cutting grass or when outside as needed ? ? Diabetes Interventions:  (Status:  Goal on track:  Yes.) Long Term Goal ?Assessed patient's understanding of A1c goal: <7% ?Discussed Medications ?Encouraged to continue to check blood sugar as recommended and contact provider if  consistently out of range ?Lab Results  ?Component Value Date  ? HGBA1C 6.5 (A) 09/05/2021  ?Hyperlipidemia Interventions:  (Status:  Goal on track:  Yes.) Long Term Goal ?Medication reviewed   ?Upcoming/Follow up a

## 2022-02-08 DIAGNOSIS — I10 Essential (primary) hypertension: Secondary | ICD-10-CM

## 2022-02-08 DIAGNOSIS — E1122 Type 2 diabetes mellitus with diabetic chronic kidney disease: Secondary | ICD-10-CM | POA: Diagnosis not present

## 2022-02-08 DIAGNOSIS — N1831 Chronic kidney disease, stage 3a: Secondary | ICD-10-CM

## 2022-02-27 ENCOUNTER — Ambulatory Visit (INDEPENDENT_AMBULATORY_CARE_PROVIDER_SITE_OTHER): Payer: Medicare HMO

## 2022-02-27 DIAGNOSIS — E1122 Type 2 diabetes mellitus with diabetic chronic kidney disease: Secondary | ICD-10-CM

## 2022-02-27 DIAGNOSIS — I10 Essential (primary) hypertension: Secondary | ICD-10-CM

## 2022-02-27 NOTE — Chronic Care Management (AMB) (Addendum)
?Chronic Care Management  ? ?CCM RN Visit Note ? ?02/27/2022 ?Name: Lindsey Fischer MRN: [id number] DOB: [birth year] ? ?Subjective: ?Lindsey Fischer is a 81 y.o. year old female who is a primary care patient of [name], [place]. The care management team was consulted for assistance with disease management and care coordination needs.   ? ?Engaged with patient by telephone for follow up visit in response to provider referral for case management and/or care coordination services.  ? ?Consent to Services:  ?The patient was given information about Chronic Care Management services, agreed to services, and gave verbal consent prior to initiation of services.  Please see initial visit note for detailed documentation.  ? ?Patient agreed to services and verbal consent obtained.  ? ?Assessment: Review of patient past medical history, allergies, medications, health status, including review of consultants reports, laboratory and other test data, was performed as part of comprehensive evaluation and provision of chronic care management services.  ? ?SDOH (Social Determinants of Health) assessments and interventions performed:   ? ?CCM Care Plan ? ?Allergies  ?Allergen Reactions  ? Peanut-Containing Drug Products Shortness Of Breath  ?  Restricted breathing  ? Livalo [Pitavastatin] Other (See Comments)  ?  Muscle cramps  ? Mucinex [Guaifenesin Er] Other (See Comments)  ?  Made blood sugar go up to 212.   ? Prednisone Other (See Comments)  ?  Bloating and abdominal pain, with oral product only.  ? Zocor [Simvastatin] Other (See Comments)  ?  Muscle cramps ?  ? Amlodipine Other (See Comments)  ?  Dizziness ?  ? Tramadol   ?  Somnolence.   ? ? ?Outpatient Encounter Medications as of 02/27/2022  ?Medication Sig Note  ? albuterol (VENTOLIN HFA) 108 (90 Base) MCG/ACT inhaler INHALE 2 PUFFS BY MOUTH ONCE DAILY AS DIRECTED   ? ferrous sulfate 325 (65 FE) MG tablet Take 325 mg by mouth daily with breakfast.   ? fluticasone-salmeterol  (WIXELA INHUB) 250-50 MCG/ACT AEPB Inhale 1 puff into the lungs in the morning and at bedtime.   ? Fluticasone-Salmeterol (WIXELA INHUB) 250-50 MCG/DOSE AEPB INHALE 1 PUFF INTO THE LUNGS EVERY 12 HOURS   ? GLUCOSAMINE-CHONDROITIN-MSM PO Take 1 tablet by mouth daily.   ? ipratropium-albuterol (DUONEB) 0.5-2.5 (3) MG/3ML SOLN Take 3 mLs by nebulization every 6 (six) hours as needed.   ? lisinopril-hydrochlorothiazide (ZESTORETIC) 20-25 MG tablet TAKE 1 TABLET EVERY DAY   ? metFORMIN (GLUCOPHAGE) 500 MG tablet Take 1 tablet (500 mg total) by mouth 2 (two) times daily with a meal.   ? montelukast (SINGULAIR) 10 MG tablet Take 1 tablet (10 mg total) by mouth at bedtime.   ? Omega-3 Fatty Acids (FISH OIL) 1200 MG CAPS Take 1 capsule by mouth daily. 01/02/2022: Reports takes every other day ?  ? rosuvastatin (CRESTOR) 20 MG tablet TAKE 1 TABLET EVERY DAY   ? VITAMIN D PO Take 1 capsule by mouth daily. Reports dose is 5000 IU   ? Alcohol Swabs (B-D SINGLE USE SWABS REGULAR) PADS 1 application by Does not apply route daily as needed (to use with injections).   ? AMBULATORY NON FORMULARY MEDICATION Nebulizer machine and tubing ? ?Dx:  COPD exacerbation   ? AMBULATORY NON FORMULARY MEDICATION 2 (two) times daily. Osteobiflex 07/11/2021: Reports takes once a day  ? Blood Glucose Monitoring Suppl (TRUE METRIX METER) w/Device KIT USE AS DIRECTED   ? Cholecalciferol (VITAMIN D3) 100000 UNIT/GM POWD Take by mouth. (Patient not taking: Reported on 02/27/2022)   ?  fexofenadine (ALLEGRA ALLERGY) 180 MG tablet Take 1 tablet (180 mg total) by mouth daily for 15 days. (Patient not taking: Reported on 01/28/2022)   ? TRUE METRIX BLOOD GLUCOSE TEST test strip TEST BLOOD SUGAR TWICE DAILY AS NEEDED   ? TRUEplus Lancets 33G MISC TEST BLOOD SUGAR TWICE DAILY AS NEEDED   ? ?Facility-Administered Encounter Medications as of 02/27/2022  ?Medication  ? iopamidol (ISOVUE-300) 61 % injection 80 mL  ? ? ?Patient Active Problem List  ? Diagnosis Date  Noted  ? Diverticulitis 12/28/2021  ? Left lower quadrant abdominal pain 12/26/2021  ? Hypertension associated with diabetes ([place]) 06/11/2021  ? Age-related cataract of left eye 03/06/2021  ? Rib pain on left side 12/08/2020  ? Atopic dermatitis 08/28/2020  ? Seborrheic dermatitis 05/22/2020  ? BPPV (benign paroxysmal positional vertigo), right 02/18/2020  ? Hypertriglyceridemia 02/18/2020  ? Nausea 07/02/2019  ? Ingrown toenail of left foot 07/31/2018  ? 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 Cervical radiculopathy 01/29/2018  ? Chronic neck pain 01/16/2018  ? Neck stiffness 08/20/2017  ? Numbness 08/20/2017  ? CKD (chronic kidney disease) stage 3, GFR 30-59 ml/min (HCC) 06/03/2017  ? Combined forms of age-related cataract of both eyes 05/27/2017  ? 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 Posterior vitreous detachment of both eyes 05/27/2017  ? Elevated serum creatinine 04/23/2017  ? Pure hypercholesterolemia 04/22/2017  ? New daily persistent headache 10/22/2016  ? Dyslipidemia, goal LDL below 70 04/22/2016  ? Diverticulosis 12/20/2015  ? Gastritis and gastroduodenitis 12/20/2015  ? Umbilical hernia 16/08/9603  ? Type 2 diabetes mellitus with complication, without long-term current use of insulin ([place]) 11/15/2012  ? Metabolic syndrome X 54/07/8118  ? ESSENTIAL HYPERTENSION, BENIGN 03/27/2010  ? Dyslipidemia 01/19/2010  ? Asthma 01/19/2010  ? ? ?Conditions to be addressed/monitored:HTN, HLD, CKD Stage 3, and Asthma ? ?Care Plan : RN Care Manager Plan of Care  ?Updates made by [name], RN since 02/27/2022 12:00 AM  ?  ? ?Problem: Chronic Disease Management Education and/or Care Coordination needs.   ?Priority: High  ?  ? ?Long-Range Goal: Development of plan of care for Chronic Disease Management and/or Care Coordination needs   ?Start Date: 10/17/2021  ?Expected End Date: 06/01/2022  ?Priority: High  ?Note:   ?Current Barriers:  ?01/30/22 [name] states she is doing well. She remains  active, cut the grass last week and states had to use the nebulizer machine after cutting her grass. She states she uses albuterol inhaler once a day in addition to scheduled medications. Denies any problems at this time. Medications reviewed and reports she has all prescribed medication. Blood sugar this morning was 152 ac, which surprised her that it was "that high". She reports normal fasting blood sugar 125-135. She reports she had potato salad for dinner and a few grapes before bed. She did not check her blood sugar before bed last night. and usually checks her blood sugar twice a day. She continues to walk, but states has had to other things during her designated walk time for the past two weeks. She states she will "get back to walking".   ?02/27/22 Mrs. Dubose reports she was not feeling well yesterday. She reports she went to the church to volunteer. After coming home felt "chilly, laid down and went to sleep. She felt flushed after awakening-Temperature taken was 100 and she took a good powder. BP ranged from 150's/60's-163/78 yesterday. She reports taking a Home Covid test- it was  negative. Today, Mrs. Sandoval reports she is feeling like her usual self today with energy to work in her yard. Temp this morning was 97.2. and BP was 138/81. She denies any difficulty breathing or signs/symptoms of asthma exacerbation. Blood sugar today 136. She is without questions or concerns at this time. ?Knowledge Deficits related to plan of care for management of HTN, HLD, DMII, CKD Stage 3, and Asthma  ? ?RNCM Clinical Goal(s):  ?Patient will demonstrate improved adherence to prescribed treatment plan for HTN, HLD, DMII, CKD Stage 3, and Asthma as evidenced by daily monitoring and recording of CBG, improved adherence to ADA/Care modified diet, exercise 3 days/week, adherence to prescribed meditation regimen  through collaboration with RN Care manager, provider, and care team.  ? ?Interventions: ?1:1 collaboration with  primary care provider regarding development and update of comprehensive plan of care as evidenced by provider attestation and co-signature ?Inter-disciplinary care team collaboration (see longitudinal plan of care) ?E

## 2022-02-27 NOTE — Patient Instructions (Addendum)
Visit Information ? ?Thank you for taking time to visit with me today. Please don't hesitate to contact me if I can be of assistance to you before our next scheduled telephone appointment. ? ?Following are the goals we discussed today:  ?Patient Goals/Self-Care Activities: ?Take medications as prescribed   ?Attend all scheduled provider appointments ?Call provider office for new concerns or questions  ?Continue to check and record blood pressure 1-2 times/week or more if blood pressure is out of normal range ?Continue to check and record blood sugar as recommended by your provider and if you feel it is too high or too low. Take record to your provider visits ?Continue to eat healthy: lean meats, fruits, vegetables healthy fats; low carbohydrate-limit sugars, rice, potatoes and pasta. Foods that can lower triglycerides-fatty fish, quinoa, avocado, coconut oil, garlic and cruciferous vegetables. ?Continue to remain active per provider recommendations ?Review education on "Dash diet" and "Eating when you have Diabetes". Call your RN Case Manager if you have any questions. ? ?Our next appointment is by telephone on 04/24/22 at 9:00 am ? ?Please call the care guide team at [phone] if you need to cancel or reschedule your appointment.  ? ?If you are experiencing a Mental Health or [place] or need someone to talk to, please call the Suicide and Crisis Lifeline: 988 ?call 1-800-273-TALK (toll free, 24 hour hotline)  ? ?The patient verbalized understanding of instructions, educational materials, and care plan provided today and agreed to receive a mailed copy of patient instructions, educational materials, and care plan.  ? ?Thea Silversmith, RN, MSN, BSN, CCM ?Care Management Coordinator ?[facility] ?[phone]  ?

## 2022-03-06 ENCOUNTER — Ambulatory Visit: Payer: Medicare HMO | Admitting: Physician Assistant

## 2022-03-10 DIAGNOSIS — N1831 Chronic kidney disease, stage 3a: Secondary | ICD-10-CM | POA: Diagnosis not present

## 2022-03-10 DIAGNOSIS — I10 Essential (primary) hypertension: Secondary | ICD-10-CM

## 2022-03-10 DIAGNOSIS — E1122 Type 2 diabetes mellitus with diabetic chronic kidney disease: Secondary | ICD-10-CM | POA: Diagnosis not present

## 2022-03-12 ENCOUNTER — Encounter: Payer: Self-pay | Admitting: Physician Assistant

## 2022-03-12 ENCOUNTER — Ambulatory Visit (INDEPENDENT_AMBULATORY_CARE_PROVIDER_SITE_OTHER): Payer: Medicare HMO | Admitting: Physician Assistant

## 2022-03-12 VITALS — BP 140/72 | HR 76 | Ht 63.0 in | Wt 163.0 lb

## 2022-03-12 DIAGNOSIS — E1122 Type 2 diabetes mellitus with diabetic chronic kidney disease: Secondary | ICD-10-CM

## 2022-03-12 DIAGNOSIS — N1831 Chronic kidney disease, stage 3a: Secondary | ICD-10-CM

## 2022-03-12 DIAGNOSIS — I10 Essential (primary) hypertension: Secondary | ICD-10-CM

## 2022-03-12 LAB — POCT GLYCOSYLATED HEMOGLOBIN (HGB A1C): Hemoglobin A1C: 6.3 % — AB (ref 4.0–5.6)

## 2022-03-12 MED ORDER — METFORMIN HCL 500 MG PO TABS: 500.0000 mg | ORAL_TABLET | Freq: Two times a day (BID) | ORAL | 1 refills | Status: DC

## 2022-03-12 MED ORDER — LISINOPRIL-HYDROCHLOROTHIAZIDE 20-25 MG PO TABS: 1.0000 | ORAL_TABLET | Freq: Every day | ORAL | 1 refills | Status: DC

## 2022-03-12 NOTE — Progress Notes (Signed)
? ?Established Patient Office Visit ? ?Subjective   ?Patient ID: Lindsey Fischer, female    DOB: 1940/12/06  Age: 81 y.o. MRN: [id number] ? ?Chief Complaint  ?Patient presents with  ? Follow-up  ? ? ?HPI ?Pt is a 81 yo female with T2DM, HTN, HLD, Asthma who presents to the clinic for follow up and medication refills.  ? ?Pt is not checking her sugars. She is taking metformin bid and eating healthy. She stays active with regular walking. She is checking her BP at home and this morning 121/66.  No CP, palpitations, headaches or vision changes. No hypoglycemic events. No open sores or wounds.  ? ? ?Active Ambulatory Problems  ?  Diagnosis Date Noted  ? Dyslipidemia 01/19/2010  ? ESSENTIAL HYPERTENSION, BENIGN 03/27/2010  ? Asthma 01/19/2010  ? Metabolic syndrome X 84/53/6468  ? Type 2 diabetes mellitus with complication, without long-term current use of insulin ([place]) 11/15/2012  ? Umbilical hernia 01/30/2247  ? Diverticulosis 12/20/2015  ? Gastritis and gastroduodenitis 12/20/2015  ? Dyslipidemia, goal LDL below 70 04/22/2016  ? New daily persistent headache 10/22/2016  ? Pure hypercholesterolemia 04/22/2017  ? Elevated serum creatinine 04/23/2017  ? Combined forms of age-related cataract of both eyes 05/27/2017  ? 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 Posterior vitreous detachment of both eyes 05/27/2017  ? CKD (chronic kidney disease) stage 3, GFR 30-59 ml/min (HCC) 06/03/2017  ? Neck stiffness 08/20/2017  ? Numbness 08/20/2017  ? Chronic neck pain 01/16/2018  ? 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 Cervical radiculopathy 01/29/2018  ? Ingrown toenail of left foot 07/31/2018  ? Nausea 07/02/2019  ? BPPV (benign paroxysmal positional vertigo), right 02/18/2020  ? Hypertriglyceridemia 02/18/2020  ? Seborrheic dermatitis 05/22/2020  ? Atopic dermatitis 08/28/2020  ? Rib pain on left side 12/08/2020  ? Age-related cataract of left eye 03/06/2021  ? Hypertension associated with diabetes ([place]) 06/11/2021   ? Left lower quadrant abdominal pain 12/26/2021  ? Diverticulitis 12/28/2021  ? ?Resolved Ambulatory Problems  ?  Diagnosis Date Noted  ? GRIEF REACTION, ACUTE 03/27/2010  ? Impaired fasting glucose 03/27/2010  ? Hypoxemia 05/22/2010  ? Pneumonia 05/06/2011  ? Epigastric abdominal tenderness with rebound tenderness 12/19/2015  ? Non-intractable cyclical vomiting with nausea 12/19/2015  ? Abdominal guarding 12/19/2015  ? Diarrhea 12/19/2015  ? Helicobacter pylori gastritis 12/20/2015  ? Atypical chest pain 10/22/2016  ? Neck muscle spasm 08/20/2017  ? Other headache syndrome 08/20/2017  ? Pneumonia of both lower lobes due to infectious organism 07/06/2019  ? ?Past Medical History:  ?Diagnosis Date  ? Asthma   ? Diabetes mellitus   ? Hyperlipidemia   ? Hypertension   ? ? ? ? ?Review of Systems  ?All other systems reviewed and are negative. ? ?  ?Objective:  ?  ? ?BP 140/72   Pulse 76   Ht '5\' 3"'$  (1.6 m)   Wt 163 lb (73.9 kg)   SpO2 97%   BMI 28.87 kg/m?  ?BP Readings from Last 3 Encounters:  ?03/12/22 140/72  ?12/26/21 (!) 157/66  ?09/28/21 134/77  ? ?  ? ?Physical Exam ?Vitals reviewed.  ?Constitutional:   ?   Appearance: Normal appearance. She is obese.  ?HENT:  ?   Head: Normocephalic.  ?Cardiovascular:  ?   Rate and Rhythm: Normal rate and regular rhythm.  ?   Pulses: Normal pulses.  ?   Heart sounds: Murmur heard.  ?Pulmonary:  ?   Effort: Pulmonary effort is normal.  ?  Breath sounds: Normal breath sounds.  ?Abdominal:  ?   Hernia: A hernia is present.  ?   Comments: Umbilical hernia  ?Musculoskeletal:  ?   Right lower leg: No edema.  ?   Left lower leg: No edema.  ?Neurological:  ?   General: No focal deficit present.  ?   Mental Status: She is alert and oriented to person, place, and time.  ?Psychiatric:     ?   Mood and Affect: Mood normal.  ? ? ? ? ?Last hemoglobin A1c ?Lab Results  ?Component Value Date  ? HGBA1C 6.3 (A) 03/12/2022  ? ?  ? ?  ?Assessment & Plan:  ?..Lindsey Fischer was seen today for  follow-up. ? ?Diagnoses and all orders for this visit: ? ?Type 2 diabetes mellitus with stage 3a chronic kidney disease, without long-term current use of insulin (HCC) ?-     POCT glycosylated hemoglobin (Hb A1C) ?-     metFORMIN (GLUCOPHAGE) 500 MG tablet; Take 1 tablet (500 mg total) by mouth 2 (two) times daily with a meal. ? ?Essential hypertension, benign ?-     lisinopril-hydrochlorothiazide (ZESTORETIC) 20-25 MG tablet; Take 1 tablet by mouth daily. ? ? ?a1C looks great.  ?Continue on metformin ?BP not quite to goal, reported home readings are better. Refilled lisinopril/HCTZ recheck in 3 months ?On statin ?Foot and eye exam UTD.  ?Covid vaccine x2 ?Declined tdap and shingrix ?Pneumonia and flu shot UTD.  ?Follow up in 3 months.  ? ? ? ? ?Return in about 3 months (around 06/12/2022) for Follow up.  ? ? ?Iran Planas, PA-C ? ?

## 2022-03-14 ENCOUNTER — Ambulatory Visit (INDEPENDENT_AMBULATORY_CARE_PROVIDER_SITE_OTHER): Payer: Medicare HMO

## 2022-03-14 DIAGNOSIS — Z0000 Encounter for general adult medical examination without abnormal findings: Secondary | ICD-10-CM

## 2022-03-14 DIAGNOSIS — Z1231 Encounter for screening mammogram for malignant neoplasm of breast: Secondary | ICD-10-CM

## 2022-03-15 NOTE — Progress Notes (Signed)
Normal mammogram. At you age and lower risk we could push mammograms out to every 2 years. Are you ok with that?

## 2022-04-24 ENCOUNTER — Telehealth: Payer: Medicare HMO

## 2022-04-24 ENCOUNTER — Ambulatory Visit (INDEPENDENT_AMBULATORY_CARE_PROVIDER_SITE_OTHER): Payer: Medicare HMO

## 2022-04-24 DIAGNOSIS — I10 Essential (primary) hypertension: Secondary | ICD-10-CM

## 2022-04-24 DIAGNOSIS — E118 Type 2 diabetes mellitus with unspecified complications: Secondary | ICD-10-CM

## 2022-04-24 NOTE — Chronic Care Management (AMB) (Signed)
Chronic Care Management   CCM RN Visit Note  04/24/2022 Name: Lindsey Fischer MRN: [id number] DOB: [birth year]  Subjective: Lindsey Fischer is a 81 y.o. year old female who is a primary care patient of [name], PA-C. The care management team was consulted for assistance with disease management and care coordination needs.    Engaged with patient by telephone for follow up visit in response to provider referral for case management and/or care coordination services.   Consent to Services:  The patient was given information about Chronic Care Management services, agreed to services, and gave verbal consent prior to initiation of services.  Please see initial visit note for detailed documentation.   Patient agreed to services and verbal consent obtained.   Assessment: Review of patient past medical history, allergies, medications, health status, including review of consultants reports, laboratory and other test data, was performed as part of comprehensive evaluation and provision of chronic care management services.   SDOH (Social Determinants of Health) assessments and interventions performed:    CCM Care Plan  Allergies  Allergen Reactions   Peanut-Containing Drug Products Shortness Of Breath    Restricted breathing   Livalo [Pitavastatin] Other (See Comments)    Muscle cramps   Mucinex [Guaifenesin Er] Other (See Comments)    Made blood sugar go up to 212.    Prednisone Other (See Comments)    Bloating and abdominal pain, with oral product only.   Zocor [Simvastatin] Other (See Comments)    Muscle cramps    Amlodipine Other (See Comments)    Dizziness    Tramadol     Somnolence.     Outpatient Encounter Medications as of 04/24/2022  Medication Sig Note   albuterol (VENTOLIN HFA) 108 (90 Base) MCG/ACT inhaler INHALE 2 PUFFS BY MOUTH ONCE DAILY AS DIRECTED    AMBULATORY NON FORMULARY MEDICATION 2 (two) times daily. Osteobiflex 04/24/2022: Reports takes twice a day    Cholecalciferol (VITAMIN D3) 100000 UNIT/GM POWD Take by mouth.    ferrous sulfate 325 (65 FE) MG tablet Take 325 mg by mouth daily with breakfast.    fluticasone-salmeterol (WIXELA INHUB) 250-50 MCG/ACT AEPB Inhale 1 puff into the lungs in the morning and at bedtime.    GLUCOSAMINE-CHONDROITIN-MSM PO Take 1 tablet by mouth daily.    ipratropium-albuterol (DUONEB) 0.5-2.5 (3) MG/3ML SOLN Take 3 mLs by nebulization every 6 (six) hours as needed.    lisinopril-hydrochlorothiazide (ZESTORETIC) 20-25 MG tablet Take 1 tablet by mouth daily.    metFORMIN (GLUCOPHAGE) 500 MG tablet Take 1 tablet (500 mg total) by mouth 2 (two) times daily with a meal.    montelukast (SINGULAIR) 10 MG tablet Take 1 tablet (10 mg total) by mouth at bedtime.    Omega-3 Fatty Acids (FISH OIL) 1200 MG CAPS Take 1 capsule by mouth daily. 01/02/2022: Reports takes every other day    rosuvastatin (CRESTOR) 20 MG tablet TAKE 1 TABLET EVERY DAY    VITAMIN D PO Take 1 capsule by mouth daily. Reports dose is 5000 IU    Alcohol Swabs (B-D SINGLE USE SWABS REGULAR) PADS 1 application by Does not apply route daily as needed (to use with injections).    AMBULATORY NON FORMULARY MEDICATION Nebulizer machine and tubing  Dx:  COPD exacerbation    Blood Glucose Monitoring Suppl (TRUE METRIX METER) w/Device KIT USE AS DIRECTED    TRUE METRIX BLOOD GLUCOSE TEST test strip TEST BLOOD SUGAR TWICE DAILY AS NEEDED    TRUEplus Lancets 33G  MISC TEST BLOOD SUGAR TWICE DAILY AS NEEDED    Facility-Administered Encounter Medications as of 04/24/2022  Medication   iopamidol (ISOVUE-300) 61 % injection 80 mL    Patient Active Problem List   Diagnosis Date Noted   Diverticulitis 12/28/2021   Left lower quadrant abdominal pain 12/26/2021   Hypertension associated with diabetes ([place]) 06/11/2021   Age-related cataract of left eye 03/06/2021   Rib pain on left side 12/08/2020   Atopic dermatitis 08/28/2020   Seborrheic dermatitis 05/22/2020    BPPV (benign paroxysmal positional vertigo), right 02/18/2020   Hypertriglyceridemia 02/18/2020   Nausea 07/02/2019   Ingrown toenail of left foot 07/31/2018   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   Cervical radiculopathy 01/29/2018   Chronic neck pain 01/16/2018   Neck stiffness 08/20/2017   Numbness 08/20/2017   CKD (chronic kidney disease) stage 3, GFR 30-59 ml/min (HCC) 06/03/2017   Combined forms of age-related cataract of both eyes 05/27/2017   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   Posterior vitreous detachment of both eyes 05/27/2017   Elevated serum creatinine 04/23/2017   Pure hypercholesterolemia 04/22/2017   New daily persistent headache 10/22/2016   Dyslipidemia, goal LDL below 70 04/22/2016   Diverticulosis 12/20/2015   Gastritis and gastroduodenitis 64/68/0321   Umbilical hernia 22/48/2500   Type 2 diabetes mellitus with complication, without long-term current use of insulin ([place]) 37/02/8888   Metabolic syndrome X 16/94/5038   ESSENTIAL HYPERTENSION, BENIGN 03/27/2010   Dyslipidemia 01/19/2010   Asthma 01/19/2010    Conditions to be addressed/monitored:HTN, HLD, DMII, and Asthma  Care Plan : RN Care Manager Plan of Care  Updates made by [name], RN since 04/24/2022 12:00 AM     Problem: Chronic Disease Management Education and/or Care Coordination needs.   Priority: High     Long-Range Goal: Development of plan of care for Chronic Disease Management and/or Care Coordination needs   Start Date: 10/17/2021  Expected End Date: 06/01/2022  Priority: High  Note:   Current Barriers:  04/24/22 Lindsey Fischer is in good spirits. She reports she continues to be active. States she has been checking blood sugars and blood pressure. She denies any asthma exacerbations and continues to take medications. She denies any problems or concerns at this time. Knowledge Deficits related to plan of care for management of HTN, HLD, DMII, CKD Stage 3, and Asthma    RNCM Clinical Goal(s):  Patient will demonstrate improved adherence to prescribed treatment plan for HTN, HLD, DMII, CKD Stage 3, and Asthma as evidenced by daily monitoring and recording of CBG, improved adherence to ADA/Care modified diet, exercise 3 days/week, adherence to prescribed meditation regimen  through collaboration with RN Care manager, provider, and care team.   Interventions: 1:1 collaboration with primary care provider regarding development and update of comprehensive plan of care as evidenced by provider attestation and co-signature Inter-disciplinary care team collaboration (see longitudinal plan of care) Evaluation of current treatment plan related to  self management and patient's adherence to plan as established by provider  Asthma: (Status:Goal on track:  Yes.) Long Term Goal Medications reviewed, encouraged to take medications as prescribed Encouraged to follow up with provider as recommended and with any new signs or symptoms   Diabetes Interventions:  (Status:  Goal on track:  Yes.) Long Term Goal Reports blood sugar 04/24/22 128; 04/23/22 119; 04/22/22 130; 04/21/22 139. Assessed patient's understanding of A1c goal: <7% Reviewed medications and encouraged to continue to take as recommended Encouraged to continue  to check blood sugar as recommended and contact provider if consistently out of range Lab Results  Component Value Date   HGBA1C 6.3 (A) 03/12/2022  Hyperlipidemia Interventions:  (Status:  Goal on track:  Yes.) Long Term Goal Medication reviewed and encouraged to continue to take as recommended Discussed upcoming appointments Encouraged to continue to limit saturated fats, transfats and processed foods. Last lipid level 7/22.  Hypertension and CKD Interventions:  (Status:  Goal on track:  Yes.) Long Term Goal Home BP 04/24/22 124/67 Home BP 04/23/22 119/58 Home BP 04/22/22 128/63 Home BP 04/21/22 129/68 Last practice recorded BP readings:  BP Readings  from Last 3 Encounters:  03/12/22 140/72  12/26/21 (!) 157/66  09/28/21 134/77  Most recent eGFR/CrCl:  Lab Results  Component Value Date   EGFR 49 (L) 12/26/2021    No components found for: CRCL  Reviewed medications with patient and discussed importance of compliance  Reviewed home BP readings with patient Provided positive feedback regarding self health managment  Patient Goals/Self-Care Activities: Take medications as prescribed   Attend all scheduled provider appointments Call provider office for new concerns or questions  Check and record blood pressure 1-2 times/week or more if blood pressure is out of normal range Check and record blood sugar as recommended by your provider and if you feel it is too high or too low. Take record to your provider visits Continue to eat healthy: lean meats, fruits, vegetables healthy fats; low carbohydrate-limit sugars, rice, potatoes and pasta. Foods that can lower triglycerides-fatty fish, quinoa, avocado, coconut oil, garlic and cruciferous vegetables. Continue to remain active   Plan:Telephone follow up appointment with care management team member scheduled for:  2-3 months The patient has been provided with contact information for the care management team and has been advised to call with any health related questions or concerns.   Thea Silversmith, RN, MSN, BSN, CCM Care Management Coordinator [facility] [phone]

## 2022-04-24 NOTE — Patient Instructions (Addendum)
Visit Information  Thank you for taking time to visit with me today. Please don't hesitate to contact me if I can be of assistance to you before our next scheduled telephone appointment.  Following are the goals we discussed today:  Patient Goals/Self-Care Activities: Take medications as prescribed   Attend all scheduled provider appointments Call provider office for new concerns or questions  Check and record blood pressure 1-2 times/week or more if blood pressure is out of normal range Check and record blood sugar as recommended by your provider and if you feel it is too high or too low. Take record to your provider visits Continue to eat healthy: lean meats, fruits, vegetables healthy fats; low carbohydrate-limit sugars, rice, potatoes and pasta. Foods that can lower triglycerides-fatty fish, quinoa, avocado, coconut oil, garlic and cruciferous vegetables. Continue to remain active  Our next appointment is by telephone on 06/26/22 at 1:15pm  Please call the care guide team at [phone] if you need to cancel or reschedule your appointment.   If you are experiencing a Mental Health or [place] or need someone to talk to, please call the Suicide and Crisis Lifeline: 988 call 1-800-273-TALK (toll free, 24 hour hotline)   The patient verbalized understanding of instructions, educational materials, and care plan provided today and agreed to receive a mailed copy of patient instructions, educational materials, and care plan.   Thea Silversmith, RN, MSN, BSN, CCM Care Management Coordinator [facility] [phone] Test Why am I having this test? Triglycerides are a type of fat in the body. Having a high level of triglycerides can increase your risk for heart disease. You may have this test as part of a routine physical exam. Health care providers recommend that adults have this test at least once every 5 years. If you have risk factors for heart disease  or are being treated for high triglycerides, you may need to have this test more often. What is being tested? This test measures the amount of triglycerides in your blood. Triglycerides are naturally present in the body, and you also take in triglycerides by eating certain foods. Triglycerides can come from refined carbohydrate foods, which are foods high in added sugars and low in nutrients and fiber, and from alcohol. Triglycerides store energy eaten at meals for use between meals. If you eat more than you burn, this extra energy can result in high triglycerides. Triglycerides may be measured as part of a test called a lipid profile, which tests triglycerides and cholesterol levels. What kind of sample is taken?  A blood sample is required for this test. It may be collected by inserting a needle into a blood vessel or by pricking a fingertip with a small needle (finger stick). How do I prepare for this test? Follow instructions from your health care provider about changing or stopping your regular medicines. Do not eat or drink anything except water starting 9-12 hours before your test, or as long as told by your health care provider. Do not drink alcohol starting at least 24 hours before your test. Follow any instructions from your health care provider about dietary restrictions before your test. Tell a health care provider about: All medicines you are taking, including vitamins, herbs, eye drops, creams, and over-the-counter medicines. Any bleeding problems you have. Any medical conditions you have. How are the results reported? Your test results will be reported as a value that indicates how many triglycerides are in your blood. This will be given as milligrams of triglycerides  per deciliter of blood (mg/dL). Your health care provider will compare your results to normal values that were established after testing a large group of people (reference ranges). Reference ranges may vary among labs  and hospitals. For this test, common reference ranges are: Adults: Female: 40-160 mg/dL or 0.45-1.81 mmol/L (SI units). Female: 35-135 mg/dL or 0.40-1.52 mmol/L (SI units). Teens 76-73 years old: Female: 40-163 mg/dL. Female: 40-128 mg/dL. Children 44-18 years old: Female: 36-138 mg/dL. Female: 41-138 mg/dL. Children 56-85 years old: Female: 31-108 mg/dL. Female: 35-114 mg/dL. Children 5 years or younger: Female: 30-86 mg/dL. Female: 32-99 mg/dL. What do the results mean? Results that are within the reference range are considered normal. This means that you have a normal amount of triglycerides in your blood. Results that are higher than your reference range mean that there are too many triglycerides in your blood. This may mean that you: Have a higher risk of heart disease. Have certain diseases that cause high triglycerides, such as diabetes. Are taking certain medicines such as estrogens and oral contraceptives. Talk with your health care provider about what your results mean. Questions to ask your health care provider Ask your health care provider, or the department that is doing the test: When will my results be ready? How will I get my results? What are my treatment options? What other tests do I need? What are my next steps? Summary Triglycerides are a type of fat in the body. Having a high level of triglycerides can increase your risk for heart disease. You may have this test as part of a routine physical exam. Triglycerides may be measured as part of a test called a lipid profile, which tests triglycerides and cholesterol. Talk with your health care provider about what your results mean. This information is not intended to replace advice given to you by your health care provider. Make sure you discuss any questions you have with your health care provider. Document Revised: 02/07/2021 Document Reviewed: 02/07/2021 Elsevier Patient Education  [place].

## 2022-05-10 DIAGNOSIS — I129 Hypertensive chronic kidney disease with stage 1 through stage 4 chronic kidney disease, or unspecified chronic kidney disease: Secondary | ICD-10-CM

## 2022-05-10 DIAGNOSIS — N183 Chronic kidney disease, stage 3 unspecified: Secondary | ICD-10-CM | POA: Diagnosis not present

## 2022-05-10 DIAGNOSIS — E785 Hyperlipidemia, unspecified: Secondary | ICD-10-CM | POA: Diagnosis not present

## 2022-05-10 DIAGNOSIS — J45909 Unspecified asthma, uncomplicated: Secondary | ICD-10-CM

## 2022-05-10 DIAGNOSIS — E1122 Type 2 diabetes mellitus with diabetic chronic kidney disease: Secondary | ICD-10-CM | POA: Diagnosis not present

## 2022-05-11 ENCOUNTER — Other Ambulatory Visit: Payer: Self-pay | Admitting: Physician Assistant

## 2022-05-11 DIAGNOSIS — J4520 Mild intermittent asthma, uncomplicated: Secondary | ICD-10-CM

## 2022-06-18 ENCOUNTER — Ambulatory Visit (INDEPENDENT_AMBULATORY_CARE_PROVIDER_SITE_OTHER): Payer: Medicare HMO | Admitting: Physician Assistant

## 2022-06-18 ENCOUNTER — Encounter: Payer: Self-pay | Admitting: Physician Assistant

## 2022-06-18 VITALS — BP 115/58 | HR 79 | Ht 63.0 in | Wt 163.0 lb

## 2022-06-18 DIAGNOSIS — J4520 Mild intermittent asthma, uncomplicated: Secondary | ICD-10-CM | POA: Diagnosis not present

## 2022-06-18 DIAGNOSIS — I10 Essential (primary) hypertension: Secondary | ICD-10-CM | POA: Diagnosis not present

## 2022-06-18 DIAGNOSIS — N183 Chronic kidney disease, stage 3 unspecified: Secondary | ICD-10-CM | POA: Diagnosis not present

## 2022-06-18 DIAGNOSIS — R809 Proteinuria, unspecified: Secondary | ICD-10-CM

## 2022-06-18 DIAGNOSIS — E118 Type 2 diabetes mellitus with unspecified complications: Secondary | ICD-10-CM | POA: Diagnosis not present

## 2022-06-18 LAB — POCT UA - MICROALBUMIN
Creatinine, POC: 100 mg/dL
Microalbumin Ur, POC: 30 mg/L

## 2022-06-18 LAB — POCT GLYCOSYLATED HEMOGLOBIN (HGB A1C): HbA1c, POC (controlled diabetic range): 6.6 % (ref 0.0–7.0)

## 2022-06-18 MED ORDER — DAPAGLIFLOZIN PROPANEDIOL 10 MG PO TABS: 10.0000 mg | ORAL_TABLET | Freq: Every day | ORAL | 0 refills | Status: DC

## 2022-06-18 MED ORDER — FLUTICASONE-SALMETEROL 250-50 MCG/ACT IN AEPB
1.0000 | INHALATION_SPRAY | Freq: Two times a day (BID) | RESPIRATORY_TRACT | 3 refills | Status: DC
Start: 2022-06-18 — End: 2023-08-01

## 2022-06-18 MED ORDER — IPRATROPIUM-ALBUTEROL 0.5-2.5 (3) MG/3ML IN SOLN: RESPIRATORY_TRACT | 3 refills | Status: DC

## 2022-06-18 NOTE — Progress Notes (Signed)
Established Patient Office Visit  Subjective   Patient ID: Lindsey Fischer, female    DOB: June 12, 1941  Age: 81 y.o. MRN: [id number]  Chief Complaint  Patient presents with   Diabetes   Follow-up    HPI Pt is a 81 yo female with T2DM, HTN, HLD, CKD 3 who presents to the clinic for 3 month follow up.   Pt is doing well. No concerns. Not checking sugars. Taking metformin daily. No CP, palpitations, headaches, vision changes. She has some intermittent SOB due to asthma. Controlled on wixella. No open sores or wounds. No hypoglycemic events. Pt is very active.   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   Cervical radiculopathy 01/29/2018   Chronic neck pain 01/16/2018   Neck stiffness 08/20/2017   Numbness 08/20/2017   CKD (chronic kidney disease) stage 3, GFR 30-59 ml/min (HCC) 06/03/2017   Combined forms of age-related cataract of both eyes 05/27/2017   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   Posterior vitreous detachment of both eyes 05/27/2017   Elevated serum creatinine 04/23/2017   Pure hypercholesterolemia 04/22/2017   New daily persistent headache 10/22/2016   Dyslipidemia, goal LDL below 70 04/22/2016   Diverticulosis 12/20/2015   Gastritis and gastroduodenitis 54/65/0354   Umbilical hernia 65/68/1275   Type 2 diabetes mellitus with complication, without long-term current use of insulin ([place]) 17/00/1749   Metabolic syndrome X 44/96/7591    ESSENTIAL HYPERTENSION, BENIGN 03/27/2010   Dyslipidemia 01/19/2010   Asthma 01/19/2010   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    Prednisone Other (See Comments)    Bloating and abdominal pain, with oral product only.   Zocor [Simvastatin] Other (See Comments)    Muscle cramps    Amlodipine Other (See Comments)    Dizziness    Tramadol     Somnolence.       Review of Systems  All other systems reviewed and are negative.     Objective:     BP (!) 115/58   Pulse 79   Ht '5\' 3"'$  (1.6 m)  Wt 163 lb (73.9 kg)   SpO2 97%   BMI 28.87 kg/m  BP Readings from Last 3 Encounters:  06/18/22 (!) 115/58  03/12/22 140/72  12/26/21 (!) 157/66   Wt Readings from Last 3 Encounters:  06/18/22 163 lb (73.9 kg)  03/12/22 163 lb (73.9 kg)  12/26/21 166 lb (75.3 kg)      Physical Exam Vitals reviewed.  Constitutional:      Appearance: Normal appearance.  HENT:     Head: Normocephalic.  Cardiovascular:     Rate and Rhythm: Normal rate and regular rhythm.     Heart sounds: Murmur heard.  Pulmonary:     Effort: Pulmonary effort is normal.     Breath sounds: Normal breath sounds.  Neurological:     General: No focal deficit present.     Mental Status: She is alert and oriented  to person, place, and time.  Psychiatric:        Mood and Affect: Mood normal.      Results for orders placed or performed in visit on 06/18/22  POCT glycosylated hemoglobin (Hb A1C)  Result Value Ref Range   Hemoglobin A1C     HbA1c POC (<> result, manual entry)     HbA1c, POC (prediabetic range)     HbA1c, POC (controlled diabetic range) 6.6 0.0 - 7.0 %  POCT UA - Microalbumin  Result Value Ref Range   Microalbumin Ur, POC 30 mg/L   Creatinine, POC 100 mg/dL   Albumin/Creatinine Ratio, Urine, POC 30-300         Assessment & Plan:  Marland KitchenMarland KitchenTasheema was seen today for diabetes and follow-up.  Diagnoses and all orders for this visit:  Type 2 diabetes mellitus with complication, without long-term current use of insulin (HCC) -     POCT glycosylated hemoglobin (Hb A1C) -     POCT UA - Microalbumin -     dapagliflozin propanediol (FARXIGA) 10 MG TABS tablet; Take 1 tablet (10 mg total) by mouth daily.  Mild intermittent asthma without complication -     ipratropium-albuterol (DUONEB) 0.5-2.5 (3) MG/3ML SOLN; INHALE THE CONTENTS OF 1 VIAL VIA NEBULIZER EVERY 6 HOURS AS NEEDED -     fluticasone-salmeterol (WIXELA INHUB) 250-50 MCG/ACT AEPB; Inhale 1 puff into the lungs in the morning and at bedtime.  Microalbuminuria -     COMPLETE METABOLIC PANEL WITH GFR -     dapagliflozin propanediol (FARXIGA) 10 MG TABS tablet; Take 1 tablet (10 mg total) by mouth daily.  Stage 3 chronic kidney disease, unspecified whether stage 3a or 3b CKD (HCC) -     COMPLETE METABOLIC PANEL WITH GFR -     dapagliflozin propanediol (FARXIGA) 10 MG TABS tablet; Take 1 tablet (10 mg total) by mouth daily.  Essential hypertension, benign -     dapagliflozin propanediol (FARXIGA) 10 MG TABS tablet; Take 1 tablet (10 mg total) by mouth daily.   A1C to goal but up some Continue on metformin Added farxiga for CKD/DM/HTN BP to goal on ACE microalbuminuria On statin UTD eye and foot exam Pneumonia/covid  UTD Flu shot next visit Follow up in 3 months     Iran Planas, PA-C

## 2022-06-18 NOTE — Patient Instructions (Signed)
Faxiga to start daily in the morning for sugar and kidney protection.

## 2022-06-19 LAB — COMPLETE METABOLIC PANEL WITH GFR
AG Ratio: 1.6 (calc) (ref 1.0–2.5)
ALT: 18 U/L (ref 6–29)
AST: 23 U/L (ref 10–35)
Albumin: 4.2 g/dL (ref 3.6–5.1)
Alkaline phosphatase (APISO): 48 U/L (ref 37–153)
BUN/Creatinine Ratio: 22 (calc) (ref 6–22)
BUN: 27 mg/dL — ABNORMAL HIGH (ref 7–25)
CO2: 26 mmol/L (ref 20–32)
Calcium: 9.3 mg/dL (ref 8.6–10.4)
Chloride: 101 mmol/L (ref 98–110)
Creat: 1.24 mg/dL — ABNORMAL HIGH (ref 0.60–0.95)
Globulin: 2.6 g/dL (calc) (ref 1.9–3.7)
Glucose, Bld: 93 mg/dL (ref 65–99)
Potassium: 4.5 mmol/L (ref 3.5–5.3)
Sodium: 135 mmol/L (ref 135–146)
Total Bilirubin: 0.5 mg/dL (ref 0.2–1.2)
Total Protein: 6.8 g/dL (ref 6.1–8.1)
eGFR: 44 mL/min/{1.73_m2} — ABNORMAL LOW (ref >=60)

## 2022-06-19 NOTE — Progress Notes (Signed)
GFR has dropped some but hopefully starting farxiga will stop any progression. Recheck in 3 months.

## 2022-06-26 ENCOUNTER — Ambulatory Visit: Payer: Medicare HMO

## 2022-06-26 NOTE — Patient Instructions (Addendum)
Visit Information  Thank you for taking time to visit with me today. Please don't hesitate to contact me if I can be of assistance to you before our next scheduled telephone appointment.  Following are the goals we discussed today:  Patient Goals/Self-Care Activities: Take medications as prescribed   Attend all scheduled provider appointments Call provider office for new concerns or questions  Check and record blood pressure routinely and notify provider if outside of recommended range Check and record blood sugar as recommended by your provider and if you feel it is too high or too low. Take record to your provider visits Continue to eat healthy: lean meats, fruits, vegetables healthy fats; low carbohydrate-limit sugars, rice, potatoes and pasta. Foods that can lower triglycerides-fatty fish, quinoa, avocado, coconut oil, garlic and cruciferous vegetables. Continue to remain active  Our next appointment is by telephone on 07/31/22 at 1:15pm  Please call the care guide team at [phone] if you need to cancel or reschedule your appointment.   If you are experiencing a Mental Health or [place] or need someone to talk to, please call the Suicide and Crisis Lifeline: 988 call 1-800-273-TALK (toll free, 24 hour hotline)   Patient verbalizes understanding of instructions and care plan provided today and agrees to view in [place]. Active MyChart status and patient understanding of how to access instructions and care plan via MyChart confirmed with patient.     The patient has been provided with contact information for the care management team and has been advised to call with any health related questions or concerns.   Thea Silversmith, RN, MSN, BSN, CCM Care Management Coordinator [facility] [phone]

## 2022-06-26 NOTE — Chronic Care Management (AMB) (Signed)
Care Management    RN Visit Note  06/26/2022 Name: Lindsey Fischer MRN: [id number] DOB: August 24, 1941  Subjective: Lindsey Fischer is a 81 y.o. year old female who is a primary care patient of Lindsey Stade, PA-C. The care management team was consulted for assistance with disease management and care coordination needs.    Engaged with patient by telephone for follow up visit in response to provider referral for case management and/or care coordination services.   Consent to Services:   Ms. Brunkhorst was given information about Care Management services today including:  Care Management services includes personalized support from designated clinical staff supervised by her physician, including individualized plan of care and coordination with other care providers 24/7 contact phone numbers for assistance for urgent and routine care needs. The patient may stop case management services at any time by phone call to the office staff.  Patient agreed to services and consent obtained.   Assessment: Review of patient past medical history, allergies, medications, health status, including review of consultants reports, laboratory and other test data, was performed as part of comprehensive evaluation and provision of chronic care management services.   SDOH (Social Determinants of Health) assessments and interventions performed:    Care Plan  Allergies  Allergen Reactions   Peanut-Containing Drug Products Shortness Of Breath    Restricted breathing   Livalo [Pitavastatin] Other (See Comments)    Muscle cramps   Mucinex [Guaifenesin Er] Other (See Comments)    Made blood sugar go up to 212.    Prednisone Other (See Comments)    Bloating and abdominal pain, with oral product only.   Zocor [Simvastatin] Other (See Comments)    Muscle cramps    Amlodipine Other (See Comments)    Dizziness    Tramadol     Somnolence.     Outpatient Encounter Medications as of 06/26/2022  Medication Sig Note    albuterol (VENTOLIN HFA) 108 (90 Base) MCG/ACT inhaler INHALE 2 PUFFS BY MOUTH ONCE DAILY AS DIRECTED    Alcohol Swabs (B-D SINGLE USE SWABS REGULAR) PADS 1 application by Does not apply route daily as needed (to use with injections).    AMBULATORY NON FORMULARY MEDICATION Nebulizer machine and tubing  Dx:  COPD exacerbation    AMBULATORY NON FORMULARY MEDICATION 2 (two) times daily. Osteobiflex 04/24/2022: Reports takes twice a day   Blood Glucose Monitoring Suppl (TRUE METRIX METER) w/Device KIT USE AS DIRECTED    Cholecalciferol (VITAMIN D3) 100000 UNIT/GM POWD Take by mouth.    dapagliflozin propanediol (FARXIGA) 10 MG TABS tablet Take 1 tablet (10 mg total) by mouth daily.    ferrous sulfate 325 (65 FE) MG tablet Take 325 mg by mouth daily with breakfast.    fluticasone-salmeterol (WIXELA INHUB) 250-50 MCG/ACT AEPB Inhale 1 puff into the lungs in the morning and at bedtime.    GLUCOSAMINE-CHONDROITIN-MSM PO Take 1 tablet by mouth daily.    ipratropium-albuterol (DUONEB) 0.5-2.5 (3) MG/3ML SOLN INHALE THE CONTENTS OF 1 VIAL VIA NEBULIZER EVERY 6 HOURS AS NEEDED    lisinopril-hydrochlorothiazide (ZESTORETIC) 20-25 MG tablet Take 1 tablet by mouth daily.    metFORMIN (GLUCOPHAGE) 500 MG tablet Take 1 tablet (500 mg total) by mouth 2 (two) times daily with a meal.    montelukast (SINGULAIR) 10 MG tablet Take 1 tablet (10 mg total) by mouth at bedtime.    Omega-3 Fatty Acids (FISH OIL) 1200 MG CAPS Take 1 capsule by mouth daily. 01/02/2022: Reports takes every other  day    rosuvastatin (CRESTOR) 20 MG tablet TAKE 1 TABLET EVERY DAY    TRUE METRIX BLOOD GLUCOSE TEST test strip TEST BLOOD SUGAR TWICE DAILY AS NEEDED    TRUEplus Lancets 33G MISC TEST BLOOD SUGAR TWICE DAILY AS NEEDED    VITAMIN D PO Take 1 capsule by mouth daily. Reports dose is 5000 IU    Facility-Administered Encounter Medications as of 06/26/2022  Medication   iopamidol (ISOVUE-300) 61 % injection 80 mL    Patient Active  Problem List   Diagnosis Date Noted   Microalbuminuria 06/18/2022   Diverticulitis 12/28/2021   Left lower quadrant abdominal pain 12/26/2021   Hypertension associated with diabetes ([place]) 06/11/2021   Age-related cataract of left eye 03/06/2021   Rib pain on left side 12/08/2020   Atopic dermatitis 08/28/2020   Seborrheic dermatitis 05/22/2020   BPPV (benign paroxysmal positional vertigo), right 02/18/2020   Hypertriglyceridemia 02/18/2020   Nausea 07/02/2019   Ingrown toenail of left foot 07/31/2018   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   Cervical radiculopathy 01/29/2018   Chronic neck pain 01/16/2018   Neck stiffness 08/20/2017   Numbness 08/20/2017   CKD (chronic kidney disease) stage 3, GFR 30-59 ml/min (HCC) 06/03/2017   Combined forms of age-related cataract of both eyes 05/27/2017   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   Posterior vitreous detachment of both eyes 05/27/2017   Elevated serum creatinine 04/23/2017   Pure hypercholesterolemia 04/22/2017   New daily persistent headache 10/22/2016   Dyslipidemia, goal LDL below 70 04/22/2016   Diverticulosis 12/20/2015   Gastritis and gastroduodenitis 59/29/2446   Umbilical hernia 28/63/8177   Type 2 diabetes mellitus with complication, without long-term current use of insulin ([place]) 11/65/7903   Metabolic syndrome X 83/33/8329   ESSENTIAL HYPERTENSION, BENIGN 03/27/2010   Dyslipidemia 01/19/2010   Asthma 01/19/2010    Conditions to be addressed/monitored: DMII  Care Plan : RN Care Manager Plan of Care  Updates made by [name], RN since 06/26/2022 12:00 AM  Completed 06/26/2022   Problem: Chronic Disease Management Education and/or Care Coordination needs. Resolved 06/26/2022  Priority: High     Long-Range Goal: Development of plan of care for Chronic Disease Management and/or Care Coordination needs Completed 06/26/2022  Start Date: 10/17/2021  Expected End Date: 06/01/2022  Priority: High   Note:   Resolving due to duplicate goals Current Barriers:  04/24/22 Mrs. Villalona reports recently started on Farxiga and is concerned about the cost $275 co-pay for a three month supply. Denies any other questions or concerns at this time.  Knowledge Deficits related to plan of care for management of HTN, HLD, DMII, CKD Stage 3, and Asthma   RNCM Clinical Goal(s):  Patient will demonstrate improved adherence to prescribed treatment plan for HTN, HLD, DMII, CKD Stage 3, and Asthma as evidenced by daily monitoring and recording of CBG, improved adherence to ADA/Care modified diet, exercise 3 days/week, adherence to prescribed meditation regimen  through collaboration with RN Care manager, provider, and care team.   Interventions: 1:1 collaboration with primary care provider regarding development and update of comprehensive plan of care as evidenced by provider attestation and co-signature Inter-disciplinary care team collaboration (see longitudinal plan of care) Evaluation of current treatment plan related to  self management and patient's adherence to plan as established by provider  Asthma: (Status:Goal Met.) Long Term Goal Medications reviewed, encouraged to take medications as prescribed Encouraged to follow up with provider as recommended and with any new signs  or symptoms   Diabetes Interventions:  (Status:  Goal on track:  Yes.) Long Term Goal Reports blood sugar 06/26/22 BS 130 and 06/25/22 BS 124 Assessed patient's understanding of A1c goal: <7% Reviewed medications and encouraged to continue to take as recommended Encouraged to continue to check blood sugar as recommended and contact provider if consistently out of range Referral to clinical pharmacist regarding medication assistance for farxiga Lab Results  Component Value Date   HGBA1C 6.6 06/18/2022  Hyperlipidemia Interventions:  (Status:  Goal Met.) Long Term Goal Medication reviewed and encouraged to continue to take as  recommended Discussed upcoming appointments Encouraged to continue to limit saturated fats, transfats and processed foods. Last lipid level 7/22.  Hypertension and CKD Interventions:  (Status:  Goal Met.) Long Term Goal Home BP 04/24/22 124/67 Home BP 04/23/22 119/58 Home BP 04/22/22 128/63 Home BP 04/21/22 129/68 Last practice recorded BP readings:  BP Readings from Last 3 Encounters:  06/18/22 (!) 115/58  03/12/22 140/72  12/26/21 (!) 157/66  Most recent eGFR/CrCl:  Lab Results  Component Value Date   EGFR 44 (L) 06/18/2022    No components found for: CRCL  Reviewed medications with patient and discussed importance of compliance  Reviewed home BP readings with patient Provided positive feedback regarding self health managment  Patient Goals/Self-Care Activities: Take medications as prescribed   Attend all scheduled provider appointments Call provider office for new concerns or questions  Check and record blood pressure routinely and notify provider if outside of recommended range Check and record blood sugar as recommended by your provider and if you feel it is too high or too low. Take record to your provider visits Continue to eat healthy: lean meats, fruits, vegetables healthy fats; low carbohydrate-limit sugars, rice, potatoes and pasta. Foods that can lower triglycerides-fatty fish, quinoa, avocado, coconut oil, garlic and cruciferous vegetables. Continue to remain active   Plan: Telephone follow up appointment with care management team member scheduled for:  next month  Thea Silversmith, RN, MSN, BSN, [facility] Care Management Coordinator [facility] [phone]

## 2022-06-28 ENCOUNTER — Telehealth: Payer: Self-pay | Admitting: *Deleted

## 2022-06-28 NOTE — Chronic Care Management (AMB) (Signed)
  Chronic Care Management   Note  06/28/2022 Name: Lindsey Fischer MRN: [id number] DOB: 12/09/1940  Lindsey Fischer is a 81 y.o. year old female who is a primary care patient of [name], [place]. I reached out to Lindsey Fischer by phone today in response to a referral sent by Ms. Beatris Si Balbuena's PCP.  Ms. Towner was given information about Chronic Care Management services today including:  CCM service includes personalized support from designated clinical staff supervised by her physician, including individualized plan of care and coordination with other care providers 24/7 contact phone numbers for assistance for urgent and routine care needs. Service will only be billed when office clinical staff spend 20 minutes or more in a month to coordinate care. Only one practitioner may furnish and bill the service in a calendar month. The patient may stop CCM services at any time (effective at the end of the month) by phone call to the office staff. The patient is responsible for co-pay (up to 20% after annual deductible is met) if co-pay is required by the individual health plan.   Patient agreed to services and verbal consent obtained.   Follow up plan: Telephone appointment with care management team member scheduled for: 07/02/2022  Julian Hy, [place] Direct Dial: [phone]

## 2022-07-01 ENCOUNTER — Telehealth: Payer: Self-pay | Admitting: Pharmacist

## 2022-07-01 NOTE — Progress Notes (Signed)
Chronic Care Management Pharmacy Assistant   Name: DANYELLE BROOKOVER  MRN: [id number] DOB: [birth year]  Lindsey Fischer is an 81 y.o. year old female who presents for his initial CCM visit with the clinical pharmacist.  Reason for Encounter: Chart Prep for Initial visit    Recent office visits:  None since last coordination call 06/26/22 CCM RN  Recent consult visits:  None since last coordination call 06/26/22 [facility] visits:  None in previous 6 months  Medications: Outpatient Encounter Medications as of 07/01/2022  Medication Sig Note   albuterol (VENTOLIN HFA) 108 (90 Base) MCG/ACT inhaler INHALE 2 PUFFS BY MOUTH ONCE DAILY AS DIRECTED    Alcohol Swabs (B-D SINGLE USE SWABS REGULAR) PADS 1 application by Does not apply route daily as needed (to use with injections).    AMBULATORY NON FORMULARY MEDICATION Nebulizer machine and tubing  Dx:  COPD exacerbation    AMBULATORY NON FORMULARY MEDICATION 2 (two) times daily. Osteobiflex 04/24/2022: Reports takes twice a day   Blood Glucose Monitoring Suppl (TRUE METRIX METER) w/Device KIT USE AS DIRECTED    Cholecalciferol (VITAMIN D3) 100000 UNIT/GM POWD Take by mouth.    dapagliflozin propanediol (FARXIGA) 10 MG TABS tablet Take 1 tablet (10 mg total) by mouth daily.    ferrous sulfate 325 (65 FE) MG tablet Take 325 mg by mouth daily with breakfast.    fluticasone-salmeterol (WIXELA INHUB) 250-50 MCG/ACT AEPB Inhale 1 puff into the lungs in the morning and at bedtime.    GLUCOSAMINE-CHONDROITIN-MSM PO Take 1 tablet by mouth daily.    ipratropium-albuterol (DUONEB) 0.5-2.5 (3) MG/3ML SOLN INHALE THE CONTENTS OF 1 VIAL VIA NEBULIZER EVERY 6 HOURS AS NEEDED    lisinopril-hydrochlorothiazide (ZESTORETIC) 20-25 MG tablet Take 1 tablet by mouth daily.    metFORMIN (GLUCOPHAGE) 500 MG tablet Take 1 tablet (500 mg total) by mouth 2 (two) times daily with a meal.    montelukast (SINGULAIR) 10 MG tablet Take 1 tablet (10 mg total) by mouth  at bedtime.    Omega-3 Fatty Acids (FISH OIL) 1200 MG CAPS Take 1 capsule by mouth daily. 01/02/2022: Reports takes every other day    rosuvastatin (CRESTOR) 20 MG tablet TAKE 1 TABLET EVERY DAY    TRUE METRIX BLOOD GLUCOSE TEST test strip TEST BLOOD SUGAR TWICE DAILY AS NEEDED    TRUEplus Lancets 33G MISC TEST BLOOD SUGAR TWICE DAILY AS NEEDED    VITAMIN D PO Take 1 capsule by mouth daily. Reports dose is 5000 IU    Facility-Administered Encounter Medications as of 07/01/2022  Medication   iopamidol (ISOVUE-300) 61 % injection 80 mL   Medication List:  albuterol (VENTOLIN HFA) 108 (90 Base) MCG/ACT inhaler -last fill 01/26/20 Alcohol Swabs (B-D SINGLE USE SWABS REGULAR) PADS-last fill 06/13/20 90 ds AMBULATORY NON FORMULARY MEDICATION AMBULATORY NON FORMULARY MEDICATION Blood Glucose Monitoring Suppl (TRUE METRIX METER) w/Device KIT-last fill 10/12/20 90 ds Cholecalciferol (VITAMIN D3) 100000 UNIT/GM POWD dapagliflozin propanediol (FARXIGA) 10 MG TABS tablet-last fill 06/18/22 90 ds ferrous sulfate 325 (65 FE) MG tablet fluticasone-salmeterol (WIXELA INHUB) 250-50 MCG/ACT AEPB-last fill 06/18/22 90 ds GLUCOSAMINE-CHONDROITIN-MSM PO ipratropium-albuterol (DUONEB) 0.5-2.5 (3) MG/3ML SOLN-last fill 06/18/22 30 ds lisinopril-hydrochlorothiazide (ZESTORETIC) 20-25 MG tablet-last fill 04/16/22 90 ds metFORMIN (GLUCOPHAGE) 500 MG tablet-last fill 03/12/22 90 ds montelukast (SINGULAIR) 10 MG tablet-last fill 08/24/21 90 ds Omega-3 Fatty Acids (FISH OIL) 1200 MG CAPS rosuvastatin (CRESTOR) 20 MG tablet-last fill 06/29/22 90 ds TRUE METRIX BLOOD GLUCOSE TEST test strip-last fill  05/13/22 90 ds TRUEplus Lancets 33G MISC-last fill 06/28/22 90 ds Vitamin D PO    Care Gaps: Colonoscopy-NA Diabetic Foot Exam-03/12/22 Mammogram-03/14/22 Ophthalmology-11/08/21 Dexa Scan - NA Annual Well Visit - NA Micro albumin-06/18/22 Hemoglobin A1c- 06/18/22 (6.3)  Star Rating Drugs: dapagliflozin propanediol (FARXIGA) 10 MG  TABS tablet-last fill 06/18/22 90 ds lisinopril-hydrochlorothiazide (ZESTORETIC) 20-25 MG tablet-last fill 04/16/22 90 ds metFORMIN (GLUCOPHAGE) 500 MG tablet-last fill 03/12/22 90 ds rosuvastatin (CRESTOR) 20 MG tablet-last fill 06/29/22 90 ds  Ethelene Hal Clinical Pharmacist Assistant [phone]

## 2022-07-02 ENCOUNTER — Ambulatory Visit (INDEPENDENT_AMBULATORY_CARE_PROVIDER_SITE_OTHER): Payer: Medicare HMO | Admitting: Pharmacist

## 2022-07-02 DIAGNOSIS — I10 Essential (primary) hypertension: Secondary | ICD-10-CM

## 2022-07-02 DIAGNOSIS — E785 Hyperlipidemia, unspecified: Secondary | ICD-10-CM

## 2022-07-02 DIAGNOSIS — E118 Type 2 diabetes mellitus with unspecified complications: Secondary | ICD-10-CM

## 2022-07-02 NOTE — Progress Notes (Signed)
Chronic Care Management Pharmacy Note  07/02/2022 Name:  Lindsey Fischer MRN:  [id number] DOB:  04/16/1941  Summary: addressed DM, HTN, HLD. Patient referred to pharmacy services for difficulty affording farxiga. She denies other side effects or medication barriers, but did describe some leg cramps at night.  BG 130-135s before farxiga, now 117 or 110s. Postprandial ~1hr after eating 190-210s.  BP: 130-135/70s   Recommendations/Changes made from today's visit: - For leg cramps: Encouraged adequate hydration. Patient will adjust her magnesium oxide to nightly dosing for leg cramps as she already owns this, next time will search for magnesium sulfate to purchase to optimize use for muscle relief. [place] patient assistance paper work arranged for front desk pickup, possibly inhaler patient assistance? TBD - Patient will refrigerate fish oil, adjust to daily dosing for triglyceride improvement.  Plan: f/u with pharmacist in 1 month  Subjective: Lindsey Fischer is an 81 y.o. year old female who is a primary patient of Alden Hipp, [name], PA-C.  The CCM team was consulted for assistance with disease management and care coordination needs.    Engaged with patient by telephone for initial visit in response to provider referral for pharmacy case management and/or care coordination services.   Consent to Services:  The patient was given the following information about Chronic Care Management services today, agreed to services, and gave verbal consent: 1. CCM service includes personalized support from designated clinical staff supervised by the primary care provider, including individualized plan of care and coordination with other care providers 2. 24/7 contact phone numbers for assistance for urgent and routine care needs. 3. Service will only be billed when office clinical staff spend 20 minutes or more in a month to coordinate care. 4. Only one practitioner may furnish and bill the service in a  calendar month. 5.The patient may stop CCM services at any time (effective at the end of the month) by phone call to the office staff. 6. The patient will be responsible for cost sharing (co-pay) of up to 20% of the service fee (after annual deductible is met). Patient agreed to services and consent obtained.  Patient Care Team: Lavada Mesi as PCP - General (Family Medicine) [name], RN as Case Manager Darius Bump, [facility] (Pharmacist)  Objective:  Lab Results  Component Value Date   CREATININE 1.24 (H) 06/18/2022   CREATININE 1.14 (H) 12/26/2021   CREATININE 1.24 (H) 06/05/2021    Lab Results  Component Value Date   HGBA1C 6.6 06/18/2022   Last diabetic Eye exam:  Lab Results  Component Value Date/Time   HMDIABEYEEXA No Retinopathy 11/08/2021 12:00 AM        Component Value Date/Time   CHOL 186 06/05/2021 0000   TRIG 349 (H) 06/05/2021 0000   HDL 51 06/05/2021 0000   CHOLHDL 3.6 06/05/2021 0000   VLDL 75 (H) 04/22/2017 0945   LDLCALC 88 06/05/2021 0000   LDLDIRECT 100 (H) 09/03/2011 1054       Latest Ref Rng & Units 06/18/2022   12:00 AM 12/26/2021   12:00 AM 06/05/2021   12:00 AM  Hepatic Function  Total Protein 6.1 - 8.1 g/dL 6.8  6.5  6.9   AST 10 - 35 U/L '23  15  17   ' ALT 6 - 29 U/L '18  10  11   ' Total Bilirubin 0.2 - 1.2 mg/dL 0.5  0.3  0.5     Lab Results  Component Value Date/Time  TSH 2.034 07/07/2012 10:36 AM       Latest Ref Rng & Units 12/26/2021   12:00 AM 07/02/2019   12:20 PM 01/30/2018    1:34 PM  CBC  WBC 3.8 - 10.8 Thousand/uL 6.4  4.1  5.1   Hemoglobin 11.7 - 15.5 g/dL 10.7  12.1  12.1   Hematocrit 35.0 - 45.0 % 31.9  35.8  35.0   Platelets 140 - 400 Thousand/uL 274  193  271     Social History   Tobacco Use  Smoking Status Former   Types: Cigarettes   Quit date: 11/11/1980   Years since quitting: 41.6  Smokeless Tobacco Never   BP Readings from Last 3 Encounters:  06/18/22 (!) 115/58  03/12/22 140/72   12/26/21 (!) 157/66   Pulse Readings from Last 3 Encounters:  06/18/22 79  03/12/22 76  12/26/21 72   Wt Readings from Last 3 Encounters:  06/18/22 163 lb (73.9 kg)  03/12/22 163 lb (73.9 kg)  12/26/21 166 lb (75.3 kg)    Assessment: Review of patient past medical history, allergies, medications, health status, including review of consultants reports, laboratory and other test data, was performed as part of comprehensive evaluation and provision of chronic care management services.   SDOH:  (Social Determinants of Health) assessments and interventions performed:    CCM Care Plan  Allergies  Allergen Reactions   Peanut-Containing Drug Products Shortness Of Breath    Restricted breathing   Livalo [Pitavastatin] Other (See Comments)    Muscle cramps   Mucinex [Guaifenesin Er] Other (See Comments)    Made blood sugar go up to 212.    Prednisone Other (See Comments)    Bloating and abdominal pain, with oral product only.   Zocor [Simvastatin] Other (See Comments)    Muscle cramps    Amlodipine Other (See Comments)    Dizziness    Tramadol     Somnolence.     Medications Reviewed Today     Reviewed by Darius Bump, [facility] (Pharmacist) on 07/02/22 at [phone]  Med List Status: <None>   Medication Order Taking? Sig Documenting Provider Last Dose Status Informant  albuterol (VENTOLIN HFA) 108 (90 Base) MCG/ACT inhaler [id number] Yes INHALE 2 PUFFS BY MOUTH ONCE DAILY AS DIRECTED [name], [name], PA-C Taking Active   Alcohol Swabs (B-D SINGLE USE SWABS REGULAR) PADS [id number] Yes 1 application by Does not apply route daily as needed (to use with injections). [name], PA-C Taking Active   AMBULATORY NON FORMULARY MEDICATION [id number] Yes Nebulizer machine and tubing  Dx:  COPD exacerbation Bowen, Collene Leyden, DO Taking Active   AMBULATORY NON FORMULARY MEDICATION [id number] Yes 2 (two) times daily. Osteobiflex [provider] Taking Active            Med Note  Isabella Stalling Apr 24, 2022  1:44 PM) Reports takes twice a day  Blood Glucose Monitoring Suppl (TRUE METRIX METER) w/Device KIT [id number] Yes USE AS DIRECTED [name], [name], PA-C Taking Active   dapagliflozin propanediol (FARXIGA) 10 MG TABS tablet [id number] Yes Take 1 tablet (10 mg total) by mouth daily. [name], PA-C Taking Active   ferrous sulfate 325 (65 FE) MG tablet [id number] Yes Take 325 mg by mouth daily with breakfast. [provider] Taking Active Self  fluticasone-salmeterol (WIXELA INHUB) 250-50 MCG/ACT AEPB [id number] Yes Inhale 1 puff into the lungs in the morning and at bedtime. [name], PA-C Taking Active  GLUCOSAMINE-CHONDROITIN-MSM PO [id number] Yes Take 1 tablet by mouth daily. [provider] Taking Active Self  ipratropium-albuterol (DUONEB) 0.5-2.5 (3) MG/3ML SOLN [id number] Yes INHALE THE CONTENTS OF 1 VIAL VIA NEBULIZER EVERY 6 HOURS AS NEEDED [name], [name], PA-C Taking Active   lisinopril-hydrochlorothiazide (ZESTORETIC) 20-25 MG tablet [id number] Yes Take 1 tablet by mouth daily. [name], PA-C Taking Active   metFORMIN (GLUCOPHAGE) 500 MG tablet [id number] Yes Take 1 tablet (500 mg total) by mouth 2 (two) times daily with a meal. [name], [name], PA-C Taking Active   montelukast (SINGULAIR) 10 MG tablet [id number] No Take 1 tablet (10 mg total) by mouth at bedtime.  Patient not taking: Reported on 07/02/2022   [name], [place] Not Taking Active   Omega-3 Fatty Acids (FISH OIL) 1200 MG CAPS [id number] Yes Take 1 capsule by mouth daily. [provider] Taking Active Self           Med Note [name], [place] Plan Jan 02, 2022  3:10 PM) Reports takes every other day   rosuvastatin (CRESTOR) 20 MG tablet [id number] Yes TAKE 1 TABLET EVERY DAY [name], PA-C Taking Active   TRUE METRIX BLOOD GLUCOSE TEST test strip [id number] Yes TEST BLOOD SUGAR TWICE DAILY AS NEEDED [name], PA-C Taking Active    TRUEplus Lancets 33G MISC [id number] Yes TEST BLOOD SUGAR TWICE DAILY AS NEEDED [name], PA-C Taking Active   VITAMIN D PO [id number] Yes Take 1 capsule by mouth daily. 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   Cervical radiculopathy 01/29/2018   Chronic neck pain 01/16/2018   Neck stiffness 08/20/2017   Numbness 08/20/2017   CKD (chronic kidney disease) stage 3, GFR 30-59 ml/min (HCC) 06/03/2017   Combined forms of age-related cataract of both eyes 05/27/2017   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   Posterior vitreous detachment of both eyes 05/27/2017   Elevated serum creatinine 04/23/2017   Pure hypercholesterolemia 04/22/2017   New daily persistent headache 10/22/2016   Dyslipidemia, goal LDL below 70 04/22/2016   Diverticulosis 12/20/2015   Gastritis and gastroduodenitis 74/10/8785   Umbilical hernia 76/72/0947   Type 2 diabetes mellitus with complication, without long-term current use of insulin ([place]) 09/62/8366   Metabolic syndrome X 29/47/6546   ESSENTIAL HYPERTENSION, BENIGN 03/27/2010   Dyslipidemia 01/19/2010   Asthma 01/19/2010    Immunization History  Administered Date(s) Administered   Fluad Quad(high Dose 65+) 11/15/2019, 08/28/2020, 09/05/2021   Influenza Whole 09/11/2010   Influenza,inj,Quad PF,6+ Mos 07/16/2013, 07/24/2015, 08/18/2017,  08/03/2018   Influenza-Unspecified 07/31/2012, 08/04/2014, 08/25/2016   PFIZER(Purple Top)SARS-COV-2 Vaccination 03/20/2020, 04/10/2020   Pneumococcal Conjugate-13 01/20/2015   Pneumococcal Polysaccharide-23 09/11/2010   Tdap 03/05/2012    Conditions to be addressed/monitored: HTN, HLD, and DMII  Care Plan : Medication management  Updates made by Darius Bump, [facility]  since 07/02/2022 12:00 AM     Problem: DM, HTN, HLD      Long-Range Goal: Disease Progression Prevention   This Visit's Progress: On track  Priority: High  Note:   Current Barriers:  Unable to independently afford treatment regimen  Pharmacist Clinical Goal(s):  Over the next 30 days, patient will verbalize ability to afford treatment regimen adhere to plan to optimize therapeutic regimen for chronic conditions as evidenced by report of adherence to recommended medication management changes through collaboration with PharmD and provider.   Interventions: 1:1 collaboration with [name], PA-C regarding development and update of comprehensive plan of care as evidenced by provider attestation and co-signature Inter-disciplinary care team collaboration (see longitudinal plan of care) Comprehensive medication review performed; medication list updated in electronic medical record  Diabetes:  Controlled; current treatment:farxiga, metformin; a1c 6.6  Current glucose readings: fasting glucose: 110s, post prandial glucose: 190-210s  Denies hypoglycemic/hyperglycemic symptoms  Current meal patterns to be discussed at future visits  Current exercise: to be discussed at future visits  Educated on glycemic goals for fasting and postprandial glucose, as well as a1c, and nutrition ideas Recommended continue current regimen Assessed patient finances. Will support patient to enroll via [place] patient assistance program,  Hypertension:  Controlled; current treatment:lisinopril-hctz 20-37m daily;   Current home  readings: to be discussed at future visits  Denies hypotensive/hypertensive symptoms  Recommended continue current regimen,  Hyperlipidemia:  Controlled; current treatment:rosuvastatin 259mdaily; LDL 88, goal of LDL <70 with concomitant diabetes, however may consider more lenient goal for age >7>92Recommended continue current regimen  Patient Goals/Self-Care Activities Over the next 30 days, patient will:  take medications as prescribed and collaborate with provider on medication access solutions  Follow Up Plan: Telephone follow up appointment with care management team member scheduled for:  1 month      Medication Assistance: Application for farxiga  medication assistance program. in process.  Anticipated assistance start date TBD.  See plan of care for additional detail.  [facility]  [id number] [id number]: [phone]: [phone] [facility] [address] [place] [place]one: [phone]: [phone] pill box? No - does each bottle, states working well for her but is considering pill box Pt endorses 90% compliance  Follow Up:  Patient agrees to Care Plan and Follow-up.  Plan: Telephone follow up appointment with care management team member scheduled for:  1 month  KeeLarinda ButteryharmD Clinical Pharmacist [phone]

## 2022-07-02 NOTE — Patient Instructions (Signed)
Visit Information   Thank you for taking time to visit with me today. Please don't hesitate to contact me if I can be of assistance to you before our next scheduled telephone appointment.  Following are the goals we discussed today:   Patient Goals/Self-Care Activities Over the next 30 days, patient will:  take medications as prescribed and collaborate with provider on medication access solutions  Follow Up Plan: Telephone follow up appointment with care management team member scheduled for:  1 month   Please call the care guide team at [phone] if you need to cancel or reschedule your appointment.     Following is a copy of your full care plan:  Care Plan : Medication management  Updates made by Darius Bump, [place] since 07/02/2022 12:00 AM     Problem: DM, HTN, HLD      Long-Range Goal: Disease Progression Prevention   This Visit's Progress: On track  Priority: High  Note:   Current Barriers:  Unable to independently afford treatment regimen  Pharmacist Clinical Goal(s):  Over the next 30 days, patient will verbalize ability to afford treatment regimen adhere to plan to optimize therapeutic regimen for chronic conditions as evidenced by report of adherence to recommended medication management changes through collaboration with PharmD and provider.   Interventions: 1:1 collaboration with [name], PA-C regarding development and update of comprehensive plan of care as evidenced by provider attestation and co-signature Inter-disciplinary care team collaboration (see longitudinal plan of care) Comprehensive medication review performed; medication list updated in electronic medical record  Diabetes:  Controlled; current treatment:farxiga, metformin; a1c 6.6  Current glucose readings: fasting glucose: 110s, post prandial glucose: 190-210s  Denies hypoglycemic/hyperglycemic symptoms  Current meal patterns to be discussed at future visits  Current exercise: to be  discussed at future visits  Educated on glycemic goals for fasting and postprandial glucose, as well as a1c, and nutrition ideas Recommended continue current regimen Assessed patient finances. Will support patient to enroll via [place] patient assistance program,  Hypertension:  Controlled; current treatment:lisinopril-hctz 20-22m daily;   Current home readings: to be discussed at future visits  Denies hypotensive/hypertensive symptoms  Recommended continue current regimen,  Hyperlipidemia:  Controlled; current treatment:rosuvastatin 236mdaily; LDL 88, goal of LDL <70 with concomitant diabetes, however may consider more lenient goal for age >7>50Recommended continue current regimen  Patient Goals/Self-Care Activities Over the next 30 days, patient will:  take medications as prescribed and collaborate with provider on medication access solutions  Follow Up Plan: Telephone follow up appointment with care management team member scheduled for:  1 month      Consent to CCM Services: Ms. BrRunyanas given information about Chronic Care Management services including:  CCM service includes personalized support from designated clinical staff supervised by her physician, including individualized plan of care and coordination with other care providers 24/7 contact phone numbers for assistance for urgent and routine care needs. Service will only be billed when office clinical staff spend 20 minutes or more in a month to coordinate care. Only one practitioner may furnish and bill the service in a calendar month. The patient may stop CCM services at any time (effective at the end of the month) by phone call to the office staff. The patient will be responsible for cost sharing (co-pay) of up to 20% of the service fee (after annual deductible is met).  Patient agreed to services and verbal consent obtained.   Patient verbalizes understanding of instructions and care plan  provided today and agrees to  view in [place]. Active MyChart status and patient understanding of how to access instructions and care plan via MyChart confirmed with patient.     Telephone follow up appointment with care management team member scheduled for: 1 month

## 2022-07-11 ENCOUNTER — Telehealth: Payer: Self-pay | Admitting: Physician Assistant

## 2022-07-11 DIAGNOSIS — Z7984 Long term (current) use of oral hypoglycemic drugs: Secondary | ICD-10-CM

## 2022-07-11 DIAGNOSIS — E785 Hyperlipidemia, unspecified: Secondary | ICD-10-CM | POA: Diagnosis not present

## 2022-07-11 DIAGNOSIS — E1169 Type 2 diabetes mellitus with other specified complication: Secondary | ICD-10-CM

## 2022-07-11 DIAGNOSIS — I10 Essential (primary) hypertension: Secondary | ICD-10-CM

## 2022-07-12 NOTE — Telephone Encounter (Signed)
Forms completed and faxed to [employer] at [phone]. Given to [place].

## 2022-07-26 NOTE — Telephone Encounter (Signed)
Patient assistance approved valid 07/17/2022-11/10/2022.

## 2022-07-29 NOTE — Progress Notes (Signed)
Chronic Care Management Pharmacy Note  07/30/2022 Name:  Lindsey Fischer MRN:  [id number] DOB:  [birth year]  Summary: addressed DM, HTN, HLD. Patient referred to pharmacy services for difficulty affording farxiga. Patient assistance approved, valid 07/17/22 - 11/10/22  BG 110-120s, post-prandial 170s   BP: 130-135/70s   Recommendations/Changes made from today's visit: - Continue current regimen, no changes, will await farxiga medication delivered to patient's home  Plan: f/u with pharmacist in 1 month  Subjective: Lindsey Fischer is an 81 y.o. year old female who is a primary patient of [name], PA-C.  The CCM team was consulted for assistance with disease management and care coordination needs.    Engaged with patient by telephone for follow up visit in response to provider referral for pharmacy case management and/or care coordination services.   Consent to Services:  The patient was given the following information about Chronic Care Management services today, agreed to services, and gave verbal consent: 1. CCM service includes personalized support from designated clinical staff supervised by the primary care provider, including individualized plan of care and coordination with other care providers 2. 24/7 contact phone numbers for assistance for urgent and routine care needs. 3. Service will only be billed when office clinical staff spend 20 minutes or more in a month to coordinate care. 4. Only one practitioner may furnish and bill the service in a calendar month. 5.The patient may stop CCM services at any time (effective at the end of the month) by phone call to the office staff. 6. The patient will be responsible for cost sharing (co-pay) of up to 20% of the service fee (after annual deductible is met). Patient agreed to services and consent obtained.  Patient Care Team: Nolene Ebbs as PCP - General (Family Medicine) Colletta Maryland, RN as Case Manager Gabriel Carina, [facility] (Pharmacist)  Objective:  Lab Results  Component Value Date   CREATININE 1.24 (H) 06/18/2022   CREATININE 1.14 (H) 12/26/2021   CREATININE 1.24 (H) 06/05/2021    Lab Results  Component Value Date   HGBA1C 6.6 06/18/2022   Last diabetic Eye exam:  Lab Results  Component Value Date/Time   HMDIABEYEEXA No Retinopathy 11/08/2021 12:00 AM        Component Value Date/Time   CHOL 186 06/05/2021 0000   TRIG 349 (H) 06/05/2021 0000   HDL 51 06/05/2021 0000   CHOLHDL 3.6 06/05/2021 0000   VLDL 75 (H) 04/22/2017 0945   LDLCALC 88 06/05/2021 0000   LDLDIRECT 100 (H) 09/03/2011 1054       Latest Ref Rng & Units 06/18/2022   12:00 AM 12/26/2021   12:00 AM 06/05/2021   12:00 AM  Hepatic Function  Total Protein 6.1 - 8.1 g/dL 6.8  6.5  6.9   AST 10 - 35 U/L 23  15  17    ALT 6 - 29 U/L 18  10  11    Total Bilirubin 0.2 - 1.2 mg/dL 0.5  0.3  0.5     Lab Results  Component Value Date/Time   TSH 2.034 07/07/2012 10:36 AM       Latest Ref Rng & Units 12/26/2021   12:00 AM 07/02/2019   12:20 PM 01/30/2018    1:34 PM  CBC  WBC 3.8 - 10.8 Thousand/uL 6.4  4.1  5.1   Hemoglobin 11.7 - 15.5 g/dL 35.0  09.3  81.8   Hematocrit 35.0 - 45.0 % 31.9  35.8  35.0  Platelets 140 - 400 Thousand/uL 274  193  271     Social History   Tobacco Use  Smoking Status Former   Types: Cigarettes   Quit date: 11/11/1980   Years since quitting: 41.7  Smokeless Tobacco Never   BP Readings from Last 3 Encounters:  06/18/22 (!) 115/58  03/12/22 140/72  12/26/21 (!) 157/66   Pulse Readings from Last 3 Encounters:  06/18/22 79  03/12/22 76  12/26/21 72   Wt Readings from Last 3 Encounters:  06/18/22 163 lb (73.9 kg)  03/12/22 163 lb (73.9 kg)  12/26/21 166 lb (75.3 kg)    Assessment: Review of patient past medical history, allergies, medications, health status, including review of consultants reports, laboratory and other test data, was performed as part of comprehensive  evaluation and provision of chronic care management services.   SDOH:  (Social Determinants of Health) assessments and interventions performed:  SDOH Interventions    Flowsheet Row Office Visit from 01/28/2022 in [facility] Chronic Care Management from 07/11/2021 in [facility] At [facility] Office Visit from 12/08/2020 in [facility] At [facility] Office Visit from 05/04/2019 in [facility] At [facility]  SDOH Interventions      Food Insecurity Interventions -- Intervention Not Indicated Intervention Not Indicated --  Housing Interventions Intervention Not Indicated -- Intervention Not Indicated --  Transportation Interventions -- Intervention Not Indicated Intervention Not Indicated --  Depression Interventions/Treatment  -- -- -- Counseling  Financial Strain Interventions Intervention Not Indicated -- Intervention Not Indicated --  Physical Activity Interventions Intervention Not Indicated -- Intervention Not Indicated --  Stress Interventions Intervention Not Indicated -- Intervention Not Indicated --  Social Connections Interventions Intervention Not Indicated -- Intervention Not Indicated --       CCM Care Plan  Allergies  Allergen Reactions   Peanut-Containing Drug Products Shortness Of Breath    Restricted breathing   Livalo [Pitavastatin] Other (See Comments)    Muscle cramps   Mucinex [Guaifenesin Er] Other (See Comments)    Made blood sugar go up to 212.    Prednisone Other (See Comments)    Bloating and abdominal pain, with oral product only.   Zocor [Simvastatin] Other (See Comments)    Muscle cramps    Amlodipine Other (See Comments)    Dizziness    Tramadol     Somnolence.     Medications Reviewed Today     Reviewed by Gabriel Carina, [facility] (Pharmacist) on 07/02/22 at [phone]  Med List Status: <None>   Medication Order Taking? Sig Documenting Provider Last Dose  Status Informant  albuterol (VENTOLIN HFA) 108 (90 Base) MCG/ACT inhaler [id number] Yes INHALE 2 PUFFS BY MOUTH ONCE DAILY AS DIRECTED [name], [name], PA-C Taking Active   Alcohol Swabs (B-D SINGLE USE SWABS REGULAR) PADS [id number] Yes 1 application by Does not apply route daily as needed (to use with injections). [name], PA-C Taking Active   AMBULATORY NON FORMULARY MEDICATION [id number] Yes Nebulizer machine and tubing  Dx:  COPD exacerbation Bowen, Scot Jun, DO Taking Active   AMBULATORY NON FORMULARY MEDICATION [id number] Yes 2 (two) times daily. Osteobiflex [provider] Taking Active            Med Note Macky Lower Apr 24, 2022  1:44 PM) Reports takes twice a day  Blood Glucose Monitoring Suppl (TRUE METRIX METER) w/Device KIT [id number] Yes USE AS DIRECTED  [name], PA-C Taking Active   dapagliflozin propanediol (FARXIGA) 10 MG TABS tablet [id number] Yes Take 1 tablet (10 mg total) by mouth daily. [name], PA-C Taking Active   ferrous sulfate 325 (65 FE) MG tablet [id number] Yes Take 325 mg by mouth daily with breakfast. [provider] Taking Active Self  fluticasone-salmeterol (WIXELA INHUB) 250-50 MCG/ACT AEPB [id number] Yes Inhale 1 puff into the lungs in the morning and at bedtime. Nolene Ebbs Taking Active   GLUCOSAMINE-CHONDROITIN-MSM PO [id number] Yes Take 1 tablet by mouth daily. [provider] Taking Active Self  ipratropium-albuterol (DUONEB) 0.5-2.5 (3) MG/3ML SOLN [id number] Yes INHALE THE CONTENTS OF 1 VIAL VIA NEBULIZER EVERY 6 HOURS AS NEEDED [name], [name], PA-C Taking Active   lisinopril-hydrochlorothiazide (ZESTORETIC) 20-25 MG tablet [id number] Yes Take 1 tablet by mouth daily. [name], PA-C Taking Active   metFORMIN (GLUCOPHAGE) 500 MG tablet [id number] Yes Take 1 tablet (500 mg total) by mouth 2 (two) times daily with a meal. [name], [name], PA-C Taking Active   montelukast (SINGULAIR) 10  MG tablet [id number] No Take 1 tablet (10 mg total) by mouth at bedtime.  Patient not taking: Reported on 07/02/2022   [name], [place] Not Taking Active   Omega-3 Fatty Acids (FISH OIL) 1200 MG CAPS [id number] Yes Take 1 capsule by mouth daily. [provider] Taking Active Self           Med Note [name], Edd [name] Jan 02, 2022  3:10 PM) Reports takes every other day   rosuvastatin (CRESTOR) 20 MG tablet [id number] Yes TAKE 1 TABLET EVERY DAY [name], PA-C Taking Active   TRUE METRIX BLOOD GLUCOSE TEST test strip [id number] Yes TEST BLOOD SUGAR TWICE DAILY AS NEEDED [name], PA-C Taking Active   TRUEplus Lancets 33G MISC [id number] Yes TEST BLOOD SUGAR TWICE DAILY AS NEEDED [name], PA-C Taking Active   VITAMIN D PO [id number] Yes Take 1 capsule by mouth daily. 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   Cervical radiculopathy 01/29/2018   Chronic neck pain 01/16/2018   Neck stiffness 08/20/2017   Numbness 08/20/2017   CKD (chronic kidney disease) stage 3, GFR 30-59 ml/min (HCC) 06/03/2017   Combined forms of age-related cataract of both eyes 05/27/2017   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   Posterior vitreous detachment of both eyes 05/27/2017   Elevated serum creatinine 04/23/2017   Pure hypercholesterolemia 04/22/2017   New  daily persistent headache 10/22/2016   Dyslipidemia, goal LDL below 70 04/22/2016   Diverticulosis 12/20/2015   Gastritis and gastroduodenitis 12/20/2015   Umbilical hernia 07/22/2014   Type 2 diabetes mellitus with complication, without long-term current use of insulin (HCC) 11/15/2012   Metabolic syndrome X 03/05/2012   ESSENTIAL HYPERTENSION, BENIGN 03/27/2010   Dyslipidemia 01/19/2010  Asthma 01/19/2010    Immunization History  Administered Date(s) Administered   Fluad Quad(high Dose 65+) 11/15/2019, 08/28/2020, 09/05/2021   Influenza Whole 09/11/2010   Influenza,inj,Quad PF,6+ Mos 07/16/2013, 07/24/2015, 08/18/2017, 08/03/2018   Influenza-Unspecified 07/31/2012, 08/04/2014, 08/25/2016   PFIZER(Purple Top)SARS-COV-2 Vaccination 03/20/2020, 04/10/2020   Pneumococcal Conjugate-13 01/20/2015   Pneumococcal Polysaccharide-23 09/11/2010   Tdap 03/05/2012    Conditions to be addressed/monitored: HTN, HLD, and DMII  There are no care plans that you recently modified to display for this patient.    Medication Assistance: Application for farxiga  medication assistance program. in process.  Anticipated assistance start date TBD.  See plan of care for additional detail.  Patient's preferred pharmacy is:  [id number] Lorenza Evangelist, [place] - [address] [place] [address] Phone: [phone] Fax: [phone]  [facility] - [place], [place] - [address] [place] [address] Phone: [phone] Fax: [phone]  Uses pill box? No - does each bottle, states working well for her but is considering pill box Pt endorses 90% compliance  Follow Up:  Patient agrees to Care Plan and Follow-up.  Plan: Telephone follow up appointment with care management team member scheduled for:  1 month  Lynnda Shields, PharmD Clinical Pharmacist [facility] Primary Care At [facility] [phone]

## 2022-07-30 ENCOUNTER — Ambulatory Visit (INDEPENDENT_AMBULATORY_CARE_PROVIDER_SITE_OTHER): Payer: Medicare HMO | Admitting: Pharmacist

## 2022-07-30 NOTE — Patient Instructions (Signed)
Visit Information  Thank you for taking time to visit with me today. Please don't hesitate to contact me if I can be of assistance to you before our next scheduled telephone appointment.  Following are the goals we discussed today:   Patient Goals/Self-Care Activities Over the next 30 days, patient will:  take medications as prescribed and collaborate with provider on medication access solutions  Follow Up Plan: Telephone follow up appointment with care management team member scheduled for:  1 month   Please call the care guide team at [phone] if you need to cancel or reschedule your appointment.   Patient verbalizes understanding of instructions and care plan provided today and agrees to view in [place]. Active MyChart status and patient understanding of how to access instructions and care plan via MyChart confirmed with patient.     Lindsey Fischer

## 2022-07-31 ENCOUNTER — Ambulatory Visit: Payer: Self-pay

## 2022-07-31 NOTE — Patient Outreach (Signed)
  Care Coordination   Follow Up Visit Note   07/31/2022 Name: Lindsey Fischer MRN: [id number] DOB: [birth year]  Lindsey Fischer is a 81 y.o. year old female who sees [place], [name], [place] for primary care. I spoke with  Lindsey Fischer by phone today.  What matters to the patients health and wellness today?  Transition from CCM follow up on referral to clinical pharmacist for medication assistance. Ms. Ferger reports her blood sugar this morning was 124 and last night it was 106. Last office visit 06/18/22 POC A1C 6.6 on 06/18/22; A1C 6.3 on 03/12/22. Patient reports she has been approved for [facility] and is being followed by clinical pharmacist. She is without questions or concern at this time.   Goals Addressed             This Visit's Progress    Care Coordination Activities-no follow up required       Care Coordination Interventions: Medications discussed and confirmed that patient has been in communication with clinical pharmacist. Confirmed patient has clinical pharmacist contact number. Confirmed patient has all the prescribed medications including Farxiga Discussed A1C level Discussed blood sugars Discussed importance of healthy eating and minimizing concentrated sweets and foods high in saturated fats/transfats Reviewed upcoming/scheduled appointments. Provided positive feedback and encouraged to contact Care Coordination if any care coordination questions or concerns in the future. Contact number provided.        SDOH assessments and interventions completed:  Yes  SDOH Interventions Today    Flowsheet Row Most Recent Value  SDOH Interventions   Housing Interventions Intervention Not Indicated  Utilities Interventions Intervention Not Indicated        Care Coordination Interventions Activated:  Yes  Care Coordination Interventions:  Yes, provided   Follow up plan: No further intervention required.   Encounter Outcome:  Pt. Visit Completed   Thea Silversmith, RN, MSN, BSN,  [place] Coordinator [phone]

## 2022-07-31 NOTE — Patient Instructions (Signed)
Visit Information  Thank you for taking time to visit with me today. Please don't hesitate to contact me at [phone] if I can be of assistance to you.   Following are the goals we discussed today:   Goals Addressed             This Visit's Progress    Care Coordination Activities-no follow up required       Care Coordination Interventions: Medications discussed and confirmed that patient has been in communication with clinical pharmacist. Confirmed patient has clinical pharmacist contact number. Confirmed patient has all the prescribed medications including Farxiga Discussed A1C level Discussed blood sugars Discussed importance of healthy eating and minimizing concentrated sweets and foods high in saturated fats/transfats Reviewed upcoming/scheduled appointments. Provided positive feedback and encouraged to contact Care Coordination if any care coordination questions or concerns in the future. Contact number provided.        If you are experiencing a Mental Health or [place] or need someone to talk to, please call the Suicide and Crisis Lifeline: 988  Patient verbalizes understanding of instructions and care plan provided today and agrees to view in [place]. Active MyChart status and patient understanding of how to access instructions and care plan via MyChart confirmed with patient.     Thea Silversmith, RN, MSN, BSN, [place] Coordinator [phone]

## 2022-08-10 DIAGNOSIS — I10 Essential (primary) hypertension: Secondary | ICD-10-CM

## 2022-08-10 DIAGNOSIS — E118 Type 2 diabetes mellitus with unspecified complications: Secondary | ICD-10-CM

## 2022-08-10 DIAGNOSIS — E785 Hyperlipidemia, unspecified: Secondary | ICD-10-CM

## 2022-08-20 ENCOUNTER — Telehealth: Payer: Self-pay | Admitting: Physician Assistant

## 2022-08-20 NOTE — Telephone Encounter (Signed)
Patient wants to know who she was referred to for surgery [facility]. She isnt sure who who was assisting her and cant get anyone to answer the phone there as well

## 2022-09-18 ENCOUNTER — Ambulatory Visit (INDEPENDENT_AMBULATORY_CARE_PROVIDER_SITE_OTHER): Payer: Medicare HMO | Admitting: Physician Assistant

## 2022-09-18 ENCOUNTER — Encounter: Payer: Self-pay | Admitting: Physician Assistant

## 2022-09-18 VITALS — BP 128/60 | HR 71 | Ht 63.0 in | Wt 162.9 lb

## 2022-09-18 DIAGNOSIS — N183 Chronic kidney disease, stage 3 unspecified: Secondary | ICD-10-CM | POA: Diagnosis not present

## 2022-09-18 DIAGNOSIS — E1122 Type 2 diabetes mellitus with diabetic chronic kidney disease: Secondary | ICD-10-CM

## 2022-09-18 DIAGNOSIS — Z23 Encounter for immunization: Secondary | ICD-10-CM | POA: Diagnosis not present

## 2022-09-18 DIAGNOSIS — N1831 Chronic kidney disease, stage 3a: Secondary | ICD-10-CM | POA: Diagnosis not present

## 2022-09-18 DIAGNOSIS — R809 Proteinuria, unspecified: Secondary | ICD-10-CM | POA: Diagnosis not present

## 2022-09-18 DIAGNOSIS — E118 Type 2 diabetes mellitus with unspecified complications: Secondary | ICD-10-CM

## 2022-09-18 DIAGNOSIS — I10 Essential (primary) hypertension: Secondary | ICD-10-CM | POA: Diagnosis not present

## 2022-09-18 LAB — POCT GLYCOSYLATED HEMOGLOBIN (HGB A1C): Hemoglobin A1C: 6.3 % — AB (ref 4.0–5.6)

## 2022-09-18 MED ORDER — DAPAGLIFLOZIN PROPANEDIOL 10 MG PO TABS: 10.0000 mg | ORAL_TABLET | Freq: Every day | ORAL | 0 refills | Status: DC

## 2022-09-18 MED ORDER — LISINOPRIL-HYDROCHLOROTHIAZIDE 20-25 MG PO TABS: 1.0000 | ORAL_TABLET | Freq: Every day | ORAL | 1 refills | Status: DC

## 2022-09-18 MED ORDER — METFORMIN HCL 500 MG PO TABS: 500.0000 mg | ORAL_TABLET | Freq: Two times a day (BID) | ORAL | 1 refills | Status: DC

## 2022-09-18 NOTE — Progress Notes (Signed)
Established Patient Office Visit  Subjective   Patient ID: Lindsey Fischer, female    DOB: 1940/12/04  Age: 81 y.o. MRN: [id number]  Chief Complaint  Patient presents with   Diabetes    HPI Pt is a 81 yo female with T2DM, CKD, HLD, HTN, and asthma who presents to the clinic for 3 month follow up.   She is having to hold on hernia repair due to her sister being sick and helping take care of her.   She is checking her sugars and running 100-120 in the mornings. No hypoglycemic events. She is taking her medications daily. No open sores or wounds. No CP, palpitations, headaches or vision changes.   She is checking her BP at home and ranging 110/60-70s.   .. Active Ambulatory Problems    Diagnosis Date Noted   Dyslipidemia 01/19/2010   ESSENTIAL HYPERTENSION, BENIGN 03/27/2010   Asthma 54/65/0354   Metabolic syndrome X 65/68/1275   Type 2 diabetes mellitus with complication, without long-term current use of insulin (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   Posterior vitreous detachment of both eyes 05/27/2017   CKD (chronic kidney disease) stage 3, GFR 30-59 ml/min (HCC) 06/03/2017   Neck stiffness 08/20/2017   Numbness 08/20/2017   Chronic neck pain 01/16/2018   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   Cervical radiculopathy 01/29/2018   Ingrown toenail of left foot 07/31/2018   Nausea 07/02/2019   BPPV (benign paroxysmal positional vertigo), right 02/18/2020   Hypertriglyceridemia 02/18/2020   Seborrheic dermatitis 05/22/2020   Atopic dermatitis 08/28/2020   Rib pain on left side 12/08/2020   Age-related cataract  of left eye 03/06/2021   Hypertension associated with diabetes ([place]) 06/11/2021   Left lower quadrant abdominal pain 12/26/2021   Diverticulitis 12/28/2021   Microalbuminuria 06/18/2022   Resolved Ambulatory Problems    Diagnosis Date Noted   GRIEF REACTION, ACUTE 03/27/2010   Impaired fasting glucose 03/27/2010   Hypoxemia 05/22/2010   Pneumonia 05/06/2011   Epigastric abdominal tenderness with rebound tenderness 12/19/2015   Non-intractable cyclical vomiting with nausea 12/19/2015   Abdominal guarding 12/19/2015   Diarrhea 63/84/6659   Helicobacter pylori gastritis 12/20/2015   Atypical chest pain 10/22/2016   Neck muscle spasm 08/20/2017   Other headache syndrome 08/20/2017   Pneumonia of both lower lobes due to infectious organism 07/06/2019   Past Medical History:  Diagnosis Date   Asthma    Diabetes mellitus    Hyperlipidemia    Hypertension      ROS See HPI.    Objective:     BP 128/60   Pulse 71   Ht '5\' 3"'$  (1.6 m)   Wt 162 lb 14.4 oz (73.9 kg)   SpO2 98%   BMI 28.86 kg/m  BP Readings from Last 3 Encounters:  09/18/22 128/60  06/18/22 (!) 115/58  03/12/22 140/72   Wt Readings from Last 3 Encounters:  09/18/22 162 lb 14.4 oz (73.9 kg)  06/18/22 163 lb (73.9 kg)  03/12/22 163 lb (73.9 kg)    .Marland Kitchen Results for orders placed or performed in visit on 09/18/22  POCT glycosylated hemoglobin (Hb A1C)  Result Value Ref Range  Hemoglobin A1C 6.3 (A) 4.0 - 5.6 %   HbA1c POC (<> result, manual entry)     HbA1c, POC (prediabetic range)     HbA1c, POC (controlled diabetic range)     .Marland Kitchen Diabetic Foot Exam - Simple   Simple Foot Form Diabetic Foot exam was performed with the following findings: Yes 09/18/2022 12:17 PM  Visual Inspection No deformities, no ulcerations, no other skin breakdown bilaterally: Yes Sensation Testing Intact to touch and monofilament testing bilaterally: Yes Pulse Check Posterior Tibialis and Dorsalis pulse intact bilaterally:  Yes Comments      Physical Exam Constitutional:      Appearance: Normal appearance.  HENT:     Head: Normocephalic.  Neck:     Vascular: No carotid bruit.  Cardiovascular:     Rate and Rhythm: Normal rate and regular rhythm.     Pulses: Normal pulses.     Heart sounds: Murmur heard.  Pulmonary:     Effort: Pulmonary effort is normal.  Musculoskeletal:     Cervical back: Normal range of motion and neck supple. No tenderness.     Right lower leg: No edema.     Left lower leg: No edema.     Comments: 2 +strong pedal pulses  Lymphadenopathy:     Cervical: No cervical adenopathy.  Neurological:     General: No focal deficit present.     Mental Status: She is alert and oriented to person, place, and time.  Psychiatric:        Mood and Affect: Mood normal.          Assessment & Plan:  Marland KitchenMarland KitchenArushi was seen today for diabetes.  Diagnoses and all orders for this visit:  Type 2 diabetes mellitus with stage 3a chronic kidney disease, without long-term current use of insulin (HCC) -     POCT glycosylated hemoglobin (Hb A1C) -     dapagliflozin propanediol (FARXIGA) 10 MG TABS tablet; Take 1 tablet (10 mg total) by mouth daily. -     metFORMIN (GLUCOPHAGE) 500 MG tablet; Take 1 tablet (500 mg total) by mouth 2 (two) times daily with a meal.  Essential hypertension, benign -     dapagliflozin propanediol (FARXIGA) 10 MG TABS tablet; Take 1 tablet (10 mg total) by mouth daily. -     lisinopril-hydrochlorothiazide (ZESTORETIC) 20-25 MG tablet; Take 1 tablet by mouth daily.  Microalbuminuria -     dapagliflozin propanediol (FARXIGA) 10 MG TABS tablet; Take 1 tablet (10 mg total) by mouth daily.  Stage 3 chronic kidney disease, unspecified whether stage 3a or 3b CKD (HCC) -     dapagliflozin propanediol (FARXIGA) 10 MG TABS tablet; Take 1 tablet (10 mg total) by mouth daily.  Needs flu shot -     Flu Vaccine QUAD High Dose(Fluad)   A1C improved Continue on same medications BP  to goal On statin.  Foot exam UTD Eye exam UTD Flu shot today Declined covid booster and will get later.  Follow up in 3 months    Return in about 3 months (around 12/19/2022).    Iran Planas, PA-C

## 2022-09-25 ENCOUNTER — Other Ambulatory Visit: Payer: Self-pay | Admitting: Physician Assistant

## 2022-10-04 DIAGNOSIS — Z79899 Other long term (current) drug therapy: Secondary | ICD-10-CM | POA: Diagnosis not present

## 2022-10-04 DIAGNOSIS — Z79891 Long term (current) use of opiate analgesic: Secondary | ICD-10-CM | POA: Diagnosis not present

## 2022-10-09 ENCOUNTER — Ambulatory Visit
Admission: EM | Admit: 2022-10-09 | Discharge: 2022-10-09 | Disposition: A | Discharge status: Home / Self Care | Payer: Medicare HMO | Attending: Family Medicine | Admitting: Family Medicine

## 2022-10-09 DIAGNOSIS — J4489 Other specified chronic obstructive pulmonary disease: Secondary | ICD-10-CM | POA: Diagnosis not present

## 2022-10-09 DIAGNOSIS — J4531 Mild persistent asthma with (acute) exacerbation: Secondary | ICD-10-CM | POA: Diagnosis not present

## 2022-10-09 DIAGNOSIS — R051 Acute cough: Secondary | ICD-10-CM | POA: Diagnosis not present

## 2022-10-09 MED ORDER — AZITHROMYCIN 250 MG PO TABS: 250.0000 mg | ORAL_TABLET | Freq: Every day | ORAL | 0 refills | Status: DC

## 2022-10-09 NOTE — ED Provider Notes (Signed)
[name]    CSN: [id number] Arrival date & time: 10/09/22  1306      History   Chief Complaint Chief Complaint  Patient presents with   Cough   Nasal Congestion    HPI Lindsey Fischer is a 81 y.o. female.   HPI  [name] has chronic asthma and COPD.  She is on Singulair, Ventolin inhaler, Ventolin by nebulizer, DuoNeb by nebulizer, and fluticasone-salmeterol daily.  Currently has a URI with cough for a week, had a temp to 101 yesterday.  No sputum production.  Had a house full of guests for thanksgiving.   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   Posterior vitreous detachment of both eyes 05/27/2017    Patient Active Problem List   Diagnosis Date Noted   Microalbuminuria 06/18/2022   Diverticulitis 12/28/2021   Left lower quadrant abdominal pain 12/26/2021   Hypertension associated with diabetes ([place]) 06/11/2021   Age-related cataract of left eye 03/06/2021   Rib pain on left side 12/08/2020   Atopic dermatitis 08/28/2020   Seborrheic dermatitis 05/22/2020   BPPV (benign paroxysmal positional vertigo), right 02/18/2020   Hypertriglyceridemia 02/18/2020   Nausea 07/02/2019   Ingrown toenail of left foot 07/31/2018   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   Cervical radiculopathy 01/29/2018   Chronic neck pain 01/16/2018   Neck stiffness 08/20/2017   Numbness 08/20/2017   CKD (chronic kidney disease) stage 3, GFR 30-59 ml/min (HCC) 06/03/2017   Combined forms of age-related cataract of both eyes 05/27/2017   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   Posterior vitreous detachment of both eyes 05/27/2017   Elevated serum creatinine 04/23/2017   Pure hypercholesterolemia 04/22/2017   New daily persistent headache 10/22/2016    Dyslipidemia, goal LDL below 70 04/22/2016   Diverticulosis 12/20/2015   Gastritis and gastroduodenitis 62/56/3893   Umbilical hernia 73/42/8768   Type 2 diabetes mellitus with complication, without long-term current use of insulin ([place]) 11/15/2012   ESSENTIAL HYPERTENSION, BENIGN 03/27/2010   Asthma 01/19/2010    Past Surgical History:  Procedure Laterality Date   APPENDECTOMY     CATARACT EXTRACTION Right    OVARIAN CYST REMOVAL      OB History   No obstetric history on file.      Home Medications    Prior to Admission medications   Medication Sig Start Date End Date Taking? Authorizing Provider  azithromycin (ZITHROMAX) 250 MG tablet Take 1 tablet (250 mg total) by mouth daily. Take first 2 tablets together, then 1 every day until finished. 10/09/22  Yes Raylene Everts, MD  albuterol (VENTOLIN HFA) 108 (90 Base) MCG/ACT inhaler INHALE 2 PUFFS BY MOUTH ONCE DAILY AS DIRECTED 12/12/20   [name], [name], PA-C  Alcohol Swabs (B-D SINGLE USE SWABS REGULAR) PADS 1 application by Does not apply route daily as needed (to use with injections). 07/10/20   [name], [name], PA-C  AMBULATORY NON FORMULARY MEDICATION Nebulizer machine and tubing  Dx:  COPD exacerbation 05/06/11   Bowen, Collene Leyden, DO  AMBULATORY NON FORMULARY MEDICATION 2 (two) times daily. Osteobiflex    [provider]  Blood Glucose Monitoring Suppl (TRUE METRIX METER) w/Device KIT USE AS DIRECTED 10/12/20   Iran Planas  L, PA-C  dapagliflozin propanediol (FARXIGA) 10 MG TABS tablet Take 1 tablet (10 mg total) by mouth daily. 09/18/22   [name], [name], PA-C  ferrous sulfate 325 (65 FE) MG tablet Take 325 mg by mouth daily with breakfast.    [provider]  fluticasone-salmeterol (WIXELA INHUB) 250-50 MCG/ACT AEPB Inhale 1 puff into the lungs in the morning and at bedtime. 06/18/22   [name], [name], PA-C  GLUCOSAMINE-CHONDROITIN-MSM PO Take 1 tablet by mouth daily.    [provider]   ipratropium-albuterol (DUONEB) 0.5-2.5 (3) MG/3ML SOLN INHALE THE CONTENTS OF 1 VIAL VIA NEBULIZER EVERY 6 HOURS AS NEEDED 06/18/22   [name], [name], PA-C  lisinopril-hydrochlorothiazide (ZESTORETIC) 20-25 MG tablet Take 1 tablet by mouth daily. 09/18/22   [name], [name], PA-C  metFORMIN (GLUCOPHAGE) 500 MG tablet Take 1 tablet (500 mg total) by mouth 2 (two) times daily with a meal. 09/18/22   [name], [name], PA-C  montelukast (SINGULAIR) 10 MG tablet Take 1 tablet (10 mg total) by mouth at bedtime. 06/05/21   [name], [name], PA-C  Omega-3 Fatty Acids (FISH OIL) 1200 MG CAPS Take 1 capsule by mouth daily.    [provider]  rosuvastatin (CRESTOR) 20 MG tablet TAKE 1 TABLET EVERY DAY 11/22/21   [name], [name], PA-C  TRUE METRIX BLOOD GLUCOSE TEST test strip TEST BLOOD SUGAR TWICE DAILY AS NEEDED 05/13/22   [name], [name], PA-C  TRUEplus Lancets 33G MISC TEST BLOOD SUGAR TWICE DAILY AS NEEDED 09/25/22   [name], [name], PA-C  VITAMIN D PO Take 1 capsule by mouth daily. Reports dose is 5000 IU    [provider]    Family History Family History  Problem Relation Age of Onset   Diabetes Mother    Heart attack Father 54       deceased   Cancer Sister        brain tumor   Cancer Brother        prostrate and lung CA   COPD Sister     Social History Social History   Tobacco Use   Smoking status: Former    Types: Cigarettes    Quit date: 11/11/1980    Years since quitting: 41.9   Smokeless tobacco: Never  Substance Use Topics   Alcohol use: No   Drug use: No     Allergies   Peanut-containing drug products, Livalo [pitavastatin], Mucinex [guaifenesin er], Prednisone, Zocor [simvastatin], Amlodipine, and Tramadol   Review of Systems Review of Systems See HPI  Physical Exam Triage Vital Signs ED Triage Vitals [10/09/22 1346]  Enc Vitals Group     BP (!) 125/57     Pulse Rate 73     Resp 17     Temp 98.6 F (37 C)     Temp Source Oral     SpO2 97 %      Weight      Height      Head Circumference      Peak Flow      Pain Score 0     Pain Loc      Pain Edu?      Excl. in [place]?    No data found.  Updated Vital Signs BP (!) 125/57 (BP Location: Right Arm)   Pulse 73   Temp 98.6 F (37 C) (Oral)   Resp 17   SpO2 97%       Physical Exam Constitutional:  General: She is not in acute distress.    Appearance: She is well-developed.  HENT:     Head: Normocephalic and atraumatic.  Eyes:     Conjunctiva/sclera: Conjunctivae normal.     Pupils: Pupils are equal, round, and reactive to light.  Cardiovascular:     Rate and Rhythm: Normal rate and regular rhythm.     Heart sounds: Normal heart sounds.  Pulmonary:     Effort: Pulmonary effort is normal. No respiratory distress.     Breath sounds: Wheezing and rhonchi present.     Comments: Few scattered wheeze and rhonchi Abdominal:     General: There is no distension.     Palpations: Abdomen is soft.  Musculoskeletal:        General: Normal range of motion.     Cervical back: Normal range of motion.  Skin:    General: Skin is warm and dry.  Neurological:     Mental Status: She is alert.      UC Treatments / Results  Labs (all labs ordered are listed, but only abnormal results are displayed) Labs Reviewed - No data to display  EKG   Radiology No results found.  Procedures Procedures (including critical care time)  Medications Ordered in UC Medications - No data to display  Initial Impression / Assessment and Plan / UC Course  I have reviewed the triage vital signs and the nursing notes.  Pertinent labs & imaging results that were available during my care of the patient were reviewed by me and considered in my medical decision making (see chart for details).     Final Clinical Impressions(s) / UC Diagnoses   Final diagnoses:  Mild persistent asthma with acute exacerbation  Acute cough     Discharge Instructions      Continue the asthma  inhalers and duo neb treatments Drink lots of water Cough medicine and drops as needed Take the azithromycin as directed Call your doctor if not improving in a few days    ED Prescriptions     Medication Sig Dispense Auth. Provider   azithromycin (ZITHROMAX) 250 MG tablet Take 1 tablet (250 mg total) by mouth daily. Take first 2 tablets together, then 1 every day until finished. 6 tablet Raylene Everts, MD      PDMP not reviewed this encounter.   Raylene Everts, MD 10/09/22 [phone]

## 2022-10-09 NOTE — ED Triage Notes (Signed)
Pt c/o cough and congestion since Friday. Fever of 101.9 yesterday. None today. Taking aspirin and OTC cough meds prn. Warm salt water gargles and nebulizer tx as well.

## 2022-10-09 NOTE — Discharge Instructions (Signed)
Continue the asthma inhalers and duo neb treatments Drink lots of water Cough medicine and drops as needed Take the azithromycin as directed Call your doctor if not improving in a few days

## 2022-10-10 ENCOUNTER — Telehealth: Payer: Self-pay

## 2022-10-10 NOTE — Telephone Encounter (Signed)
TCT pt to f/u from recent visit. Pt endorses continued cough and is taking taking rx as prescribed. Pt denies any additional needs.

## 2022-11-28 ENCOUNTER — Other Ambulatory Visit: Payer: Self-pay | Admitting: Physician Assistant

## 2022-12-20 ENCOUNTER — Ambulatory Visit (INDEPENDENT_AMBULATORY_CARE_PROVIDER_SITE_OTHER): Payer: Medicare HMO | Admitting: Physician Assistant

## 2022-12-20 ENCOUNTER — Encounter: Payer: Self-pay | Admitting: Physician Assistant

## 2022-12-20 VITALS — BP 130/61 | HR 70 | Ht 63.0 in | Wt 161.0 lb

## 2022-12-20 DIAGNOSIS — Z808 Family history of malignant neoplasm of other organs or systems: Secondary | ICD-10-CM | POA: Diagnosis not present

## 2022-12-20 DIAGNOSIS — N1831 Chronic kidney disease, stage 3a: Secondary | ICD-10-CM

## 2022-12-20 DIAGNOSIS — E1122 Type 2 diabetes mellitus with diabetic chronic kidney disease: Secondary | ICD-10-CM | POA: Diagnosis not present

## 2022-12-20 DIAGNOSIS — Z801 Family history of malignant neoplasm of trachea, bronchus and lung: Secondary | ICD-10-CM

## 2022-12-20 DIAGNOSIS — I10 Essential (primary) hypertension: Secondary | ICD-10-CM

## 2022-12-20 DIAGNOSIS — E785 Hyperlipidemia, unspecified: Secondary | ICD-10-CM | POA: Diagnosis not present

## 2022-12-20 DIAGNOSIS — J4520 Mild intermittent asthma, uncomplicated: Secondary | ICD-10-CM | POA: Diagnosis not present

## 2022-12-20 LAB — POCT GLYCOSYLATED HEMOGLOBIN (HGB A1C): Hemoglobin A1C: 6.6 % — AB (ref 4.0–5.6)

## 2022-12-20 NOTE — Progress Notes (Signed)
Established Patient Office Visit  Subjective   Patient ID: Lindsey Fischer, female    DOB: 12-29-1940  Age: 82 y.o. MRN: [id number]  Chief Complaint  Patient presents with   Diabetes    Diabetes Pertinent negatives for hypoglycemia include no dizziness or headaches. Pertinent negatives for diabetes include no blurred vision, no chest pain, no polydipsia, no weakness and no weight loss.   82 year old female with PMH of T2DM, CKD, HLD, HTN. She presents today for 3 month follow up and recheck of A1C. She states she has been eating more carbs/bread recently and would not be surprised if it has increased. She denies any acute changes. She has questions about screenings for cancer due to family history significant for brain and lung cancer.    Review of Systems  Constitutional:  Negative for fever and weight loss.  HENT:  Negative for congestion, sore throat and tinnitus.   Eyes:  Negative for blurred vision and double vision.  Respiratory:  Negative for cough, hemoptysis and shortness of breath.   Cardiovascular:  Negative for chest pain, orthopnea and leg swelling.  Gastrointestinal:  Negative for abdominal pain, constipation and diarrhea.  Genitourinary: Negative.   Musculoskeletal: Negative.   Skin: Negative.   Neurological:  Negative for dizziness, tingling, weakness and headaches.  Endo/Heme/Allergies:  Negative for polydipsia.  Psychiatric/Behavioral: Negative.        Objective:     BP 130/61   Pulse 70   Ht 5' 3"$  (1.6 m)   Wt 161 lb (73 kg)   SpO2 100%   BMI 28.52 kg/m  BP Readings from Last 3 Encounters:  12/20/22 130/61  10/09/22 (!) 125/57  09/18/22 128/60   Wt Readings from Last 3 Encounters:  12/20/22 161 lb (73 kg)  09/18/22 162 lb 14.4 oz (73.9 kg)  06/18/22 163 lb (73.9 kg)      Physical Exam Constitutional:      Appearance: Normal appearance.  HENT:     Right Ear: Tympanic membrane and ear canal normal.     Left Ear: Tympanic membrane and ear  canal normal.     Nose: Nose normal. No congestion.  Eyes:     Extraocular Movements: Extraocular movements intact.     Pupils: Pupils are equal, round, and reactive to light.  Cardiovascular:     Rate and Rhythm: Normal rate and regular rhythm.     Pulses: Normal pulses.     Heart sounds: Normal heart sounds.  Pulmonary:     Effort: Pulmonary effort is normal.     Breath sounds: Normal breath sounds. No wheezing or rhonchi.  Abdominal:     General: Abdomen is flat. Bowel sounds are normal.     Palpations: Abdomen is soft.  Musculoskeletal:     Cervical back: Normal range of motion and neck supple.  Neurological:     Mental Status: She is alert and oriented to person, place, and time.  Psychiatric:        Mood and Affect: Mood normal.        Behavior: Behavior normal.      Results for orders placed or performed in visit on 12/20/22  POCT glycosylated hemoglobin (Hb A1C)  Result Value Ref Range   Hemoglobin A1C 6.6 (A) 4.0 - 5.6 %   HbA1c POC (<> result, manual entry)     HbA1c, POC (prediabetic range)     HbA1c, POC (controlled diabetic range)         Assessment & Plan:  Doren was seen today for diabetes.  Diagnoses and all orders for this visit:  Type 2 diabetes mellitus with stage 3a chronic kidney disease, without long-term current use of insulin (HCC) -     POCT glycosylated hemoglobin (Hb A1C)  Family history of brain cancer  Family history of lung cancer  Essential hypertension, benign  Dyslipidemia, goal LDL below 70  Mild intermittent asthma without complication   [id number] increased at 6.6 this visit compared to 6.3 3 months ago. Educated pt on importance of proper glycemic control. Offered increasing dose of metformin, pt declines at this time. She states she will try dietary changes. Educated pt to avoid lots of carbs/sweets Discussed risk factors for brain and lung cancer. Pt does not meet indications for screening for any screenings. She is a former  smoker, quit >20 years ago. She has no acute symptoms at this time Offered Chest Xray if she wanted. She declines due to normal Chest xray 3 months ago. Last mammogram was done last year. No abnormal findings Instructed to continue taking current medications as prescribed. Follow up again in 3 months for A1C recheck  Return in about 3 months (around 03/20/2023).    Iran Planas, PA-C

## 2022-12-30 ENCOUNTER — Other Ambulatory Visit: Payer: Self-pay | Admitting: Physician Assistant

## 2023-01-07 DIAGNOSIS — H524 Presbyopia: Secondary | ICD-10-CM | POA: Diagnosis not present

## 2023-01-07 DIAGNOSIS — Z7984 Long term (current) use of oral hypoglycemic drugs: Secondary | ICD-10-CM | POA: Diagnosis not present

## 2023-01-07 DIAGNOSIS — Z961 Presence of intraocular lens: Secondary | ICD-10-CM | POA: Diagnosis not present

## 2023-01-07 DIAGNOSIS — E119 Type 2 diabetes mellitus without complications: Secondary | ICD-10-CM | POA: Diagnosis not present

## 2023-01-07 LAB — HM DIABETES EYE EXAM

## 2023-01-10 ENCOUNTER — Encounter: Payer: Self-pay | Admitting: Physician Assistant

## 2023-01-15 ENCOUNTER — Other Ambulatory Visit: Payer: Self-pay | Admitting: Pharmacist

## 2023-01-15 NOTE — Progress Notes (Signed)
Care Coordination Call   Contacted patient in response to paperwork from [employer] in physical mailbox.  Patient is approved for patient assistance and a shipment of farxiga '10mg'$  daily was sent to patient's home address on 01/13/23 with an estimated arrival of 1-2 business days.  Patient states blood sugars have been doing much better after making diet changes, BG 123 at this morning's check.  Other medications are going well. Will continue to monitor for medication assistance needs.  Larinda Buttery, PharmD Clinical Pharmacist [facility] [phone]

## 2023-02-03 ENCOUNTER — Telehealth: Payer: Self-pay | Admitting: General Practice

## 2023-02-03 ENCOUNTER — Ambulatory Visit (INDEPENDENT_AMBULATORY_CARE_PROVIDER_SITE_OTHER): Payer: Medicare HMO | Admitting: Physician Assistant

## 2023-02-03 DIAGNOSIS — Z0000 Encounter for general adult medical examination without abnormal findings: Secondary | ICD-10-CM

## 2023-02-03 NOTE — Telephone Encounter (Signed)
Patient stated that she would like to talk to [facility] to see if she can get any other medications free like the farxiga.  She is  interested about wixela and albulterol.  Thank you.

## 2023-02-03 NOTE — Patient Instructions (Addendum)
[place] Maintenance Summary and Written Plan of Care  Ms. Lindsey Fischer ,  Thank you for allowing me to perform your Medicare Annual Wellness Visit and for your ongoing commitment to your health.   Health Maintenance & Immunization History Health Maintenance  Topic Date Due  . DTaP/Tdap/Td (2 - Td or Tdap) 03/05/2022  . COVID-19 Vaccine (3 - Pfizer risk series) 01/06/2024 (Originally 05/08/2020)  . Zoster Vaccines- Shingrix (1 of 2) 03/19/2024 (Originally 08/14/1960)  . Diabetic kidney evaluation - eGFR measurement  06/19/2023  . Diabetic kidney evaluation - Urine ACR  06/19/2023  . HEMOGLOBIN A1C  06/20/2023  . FOOT EXAM  09/19/2023  . OPHTHALMOLOGY EXAM  01/08/2024  . Medicare Annual Wellness (AWV)  02/03/2024  . MAMMOGRAM  03/14/2024  . Pneumonia Vaccine 70+ Years old  Completed  . INFLUENZA VACCINE  Completed  . DEXA SCAN  Completed  . HPV VACCINES  Aged Out   Immunization History  Administered Date(s) Administered  . Fluad Quad(high Dose 65+) 11/15/2019, 08/28/2020, 09/05/2021, 09/18/2022  . Influenza Whole 09/11/2010  . Influenza,inj,Quad PF,6+ Mos 07/16/2013, 07/24/2015, 08/18/2017, 08/03/2018  . Influenza-Unspecified 07/31/2012, 08/04/2014, 08/25/2016  . PFIZER(Purple Top)SARS-COV-2 Vaccination 03/20/2020, 04/10/2020  . Pneumococcal Conjugate-13 01/20/2015  . Pneumococcal Polysaccharide-23 09/11/2010  . Tdap 03/05/2012    These are the patient goals that we discussed:  Goals Addressed              This Visit's Progress   .  Patient Stated (pt-stated)        02/03/2023 AWV Goal: Diabetes Management  Patient will maintain an A1C level below 8.0 Patient will not develop any diabetic foot complications Patient will not experience any hypoglycemic episodes over the next 3 months Patient will notify our office of any CBG readings outside of the provider recommended range by calling [phone] Patient will adhere to provider  recommendations for diabetes management  Patient Self Management Activities take all medications as prescribed and report any negative side effects monitor and record blood sugar readings as directed adhere to a low carbohydrate diet that incorporates lean proteins, vegetables, whole grains, low glycemic fruits check feet daily noting any sores, cracks, injuries, or callous formations see PCP or podiatrist if she notices any changes in her legs, feet, or toenails Patient will visit PCP and have an A1C level checked every 3 to 6 months as directed  have a yearly eye exam to monitor for vascular changes associated with diabetes and will request that the report be sent to her pcp.  consult with her PCP regarding any changes in her health or new or worsening symptoms         This is a list of Health Maintenance Items that are overdue or due now: Health Maintenance Due  Topic Date Due  . DTaP/Tdap/Td (2 - Td or Tdap) 03/05/2022   Shingrix vaccine  Orders/Referrals Placed Today: No orders of the defined types were placed in this encounter.  (Contact our referral department at [phone] if you have not spoken with someone about your referral appointment within the next 5 days)    Follow-up Plan Follow-up with [name], PA-C as planned Discuss bone density due date with PCP. Medicare wellness visit in one year.  AVS printed and mailed to the patient.      Health Maintenance, Female Adopting a healthy lifestyle and getting preventive care are important in promoting health and wellness. Ask your health care provider about: The right schedule for you to have  regular tests and exams. Things you can do on your own to prevent diseases and keep yourself healthy. What should I know about diet, weight, and exercise? Eat a healthy diet  Eat a diet that includes plenty of vegetables, fruits, low-fat dairy products, and lean protein. Do not eat a lot of foods that are high in solid  fats, added sugars, or sodium. Maintain a healthy weight Body mass index (BMI) is used to identify weight problems. It estimates body fat based on height and weight. Your health care provider can help determine your BMI and help you achieve or maintain a healthy weight. Get regular exercise Get regular exercise. This is one of the most important things you can do for your health. Most adults should: Exercise for at least 150 minutes each week. The exercise should increase your heart rate and make you sweat (moderate-intensity exercise). Do strengthening exercises at least twice a week. This is in addition to the moderate-intensity exercise. Spend less time sitting. Even light physical activity can be beneficial. Watch cholesterol and blood lipids Have your blood tested for lipids and cholesterol at 82 years of age, then have this test every 5 years. Have your cholesterol levels checked more often if: Your lipid or cholesterol levels are high. You are older than 82 years of age. You are at high risk for heart disease. What should I know about cancer screening? Depending on your health history and family history, you may need to have cancer screening at various ages. This may include screening for: Breast cancer. Cervical cancer. Colorectal cancer. Skin cancer. Lung cancer. What should I know about heart disease, diabetes, and high blood pressure? Blood pressure and heart disease High blood pressure causes heart disease and increases the risk of stroke. This is more likely to develop in people who have high blood pressure readings or are overweight. Have your blood pressure checked: Every 3-5 years if you are 65-39 years of age. Every year if you are 23 years old or older. Diabetes Have regular diabetes screenings. This checks your fasting blood sugar level. Have the screening done: Once every three years after age 29 if you are at a normal weight and have a low risk for diabetes. More  often and at a younger age if you are overweight or have a high risk for diabetes. What should I know about preventing infection? Hepatitis B If you have a higher risk for hepatitis B, you should be screened for this virus. Talk with your health care provider to find out if you are at risk for hepatitis B infection. Hepatitis C Testing is recommended for: Everyone born from [birth year] through 1965. Anyone with known risk factors for hepatitis C. Sexually transmitted infections (STIs) Get screened for STIs, including gonorrhea and chlamydia, if: You are sexually active and are younger than 81 years of age. You are older than 82 years of age and your health care provider tells you that you are at risk for this type of infection. Your sexual activity has changed since you were last screened, and you are at increased risk for chlamydia or gonorrhea. Ask your health care provider if you are at risk. Ask your health care provider about whether you are at high risk for HIV. Your health care provider may recommend a prescription medicine to help prevent HIV infection. If you choose to take medicine to prevent HIV, you should first get tested for HIV. You should then be tested every 3 months for as long as you  are taking the medicine. Pregnancy If you are about to stop having your period (premenopausal) and you may become pregnant, seek counseling before you get pregnant. Take 400 to 800 micrograms (mcg) of folic acid every day if you become pregnant. Ask for birth control (contraception) if you want to prevent pregnancy. Osteoporosis and menopause Osteoporosis is a disease in which the bones lose minerals and strength with aging. This can result in bone fractures. If you are 16 years old or older, or if you are at risk for osteoporosis and fractures, ask your health care provider if you should: Be screened for bone loss. Take a calcium or vitamin D supplement to lower your risk of fractures. Be given hormone  replacement therapy (HRT) to treat symptoms of menopause. Follow these instructions at home: Alcohol use Do not drink alcohol if: Your health care provider tells you not to drink. You are pregnant, may be pregnant, or are planning to become pregnant. If you drink alcohol: Limit how much you have to: 0-1 drink a day. Know how much alcohol is in your drink. In the [place] one drink equals one 12 oz bottle of beer (355 mL), one 5 oz glass of wine (148 mL), or one 1 oz glass of hard liquor (44 mL). Lifestyle Do not use any products that contain nicotine or tobacco. These products include cigarettes, chewing tobacco, and vaping devices, such as e-cigarettes. If you need help quitting, ask your health care provider. Do not use street drugs. Do not share needles. Ask your health care provider for help if you need support or information about quitting drugs. General instructions Schedule regular health, dental, and eye exams. Stay current with your vaccines. Tell your health care provider if: You often feel depressed. You have ever been abused or do not feel safe at home. Summary Adopting a healthy lifestyle and getting preventive care are important in promoting health and wellness. Follow your health care provider's instructions about healthy diet, exercising, and getting tested or screened for diseases. Follow your health care provider's instructions on monitoring your cholesterol and blood pressure. This information is not intended to replace advice given to you by your health care provider. Make sure you discuss any questions you have with your health care provider. Document Revised: 03/19/2021 Document Reviewed: 03/19/2021 Elsevier Patient Education  [place].

## 2023-02-03 NOTE — Progress Notes (Signed)
MEDICARE ANNUAL WELLNESS VISIT  02/03/2023  Telephone Visit Disclaimer This Medicare AWV was conducted by telephone due to national recommendations for restrictions regarding the COVID-19 Pandemic (e.g. social distancing).  I verified, using two identifiers, that I am speaking with Lindsey Fischer or their authorized healthcare agent. I discussed the limitations, risks, security, and privacy concerns of performing an evaluation and management service by telephone and the potential availability of an in-person appointment in the future. The patient expressed understanding and agreed to proceed.  Location of Patient: home Location of Provider (nurse):  In the office.  Subjective:    Lindsey Fischer is a 82 y.o. female patient of Alden Hipp, [name], PA-C who had a Medicare Annual Wellness Visit today via telephone. Cathey is Retired and lives alone. she has 3 children. she reports that she is socially active and does interact with friends/family regularly. she is minimally physically active and enjoys reading, playing game on her phone, church, gardening and watching television.  Patient Care Team: Lavada Mesi as PCP - General (Family Medicine) [name], RN as Case Manager Darius Bump, [facility] (Pharmacist)     02/03/2023   11:03 AM 01/28/2022   10:42 AM 12/08/2020    2:07 PM 09/09/2017   11:06 AM  Advanced Directives  Does Patient Have a Medical Advance Directive? Yes No Yes No  Type of Advance Directive Living will  [place];Living will   Does patient want to make changes to medical advance directive? No - Patient declined  No - Patient declined   Copy of [place] in Chart?   No - copy requested   Would patient like information on creating a medical advance directive?  No - Patient declined  Yes (ED - Information included in AVS)    Hospital Utilization Over the Past 12 Months: # of hospitalizations or ER visits: 0 # of  surgeries: 0  Review of Systems    Patient reports that her overall health is unchanged compared to last year.  History obtained from chart review and the patient  Patient Reported Readings (BP, Pulse, CBG, Weight, etc) none  Pain Assessment Pain : No/denies pain     Current Medications & Allergies (verified) Allergies as of 02/03/2023       Reactions   Peanut-containing Drug Products Shortness Of Breath   Restricted breathing   Livalo [pitavastatin] Other (See Comments)   Muscle cramps   Mucinex [guaifenesin Er] Other (See Comments)   Made blood sugar go up to 212.    Prednisone Other (See Comments)   Bloating and abdominal pain, with oral product only.   Zocor [simvastatin] Other (See Comments)   Muscle cramps   Amlodipine Other (See Comments)   Dizziness   Tramadol    Somnolence.         Medication List        Accurate as of February 03, 2023 11:24 AM. If you have any questions, ask your nurse or doctor.          albuterol 108 (90 Base) MCG/ACT inhaler Commonly known as: VENTOLIN HFA INHALE 2 PUFFS BY MOUTH ONCE DAILY AS DIRECTED   AMBULATORY NON FORMULARY MEDICATION Nebulizer machine and tubing  Dx:  COPD exacerbation   B-D SINGLE USE SWABS REGULAR Pads 1 application by Does not apply route daily as needed (to use with injections).   dapagliflozin propanediol 10 MG Tabs tablet Commonly known as: Farxiga Take 1 tablet (10  mg total) by mouth daily.   ferrous sulfate 325 (65 FE) MG tablet Take 325 mg by mouth daily with breakfast.   Fish Oil 1200 MG Caps Take 1 capsule by mouth daily.   fluticasone-salmeterol 250-50 MCG/ACT Aepb Commonly known as: Wixela Inhub Inhale 1 puff into the lungs in the morning and at bedtime.   ipratropium-albuterol 0.5-2.5 (3) MG/3ML Soln Commonly known as: DUONEB INHALE THE CONTENTS OF 1 VIAL VIA NEBULIZER EVERY 6 HOURS AS NEEDED   lisinopril-hydrochlorothiazide 20-25 MG tablet Commonly known as: ZESTORETIC Take  1 tablet by mouth daily.   metFORMIN 500 MG tablet Commonly known as: GLUCOPHAGE Take 1 tablet (500 mg total) by mouth 2 (two) times daily with a meal.   rosuvastatin 20 MG tablet Commonly known as: CRESTOR Take 1 tablet (20 mg total) by mouth daily. Needs labs   True Metrix Blood Glucose Test test strip Generic drug: glucose blood TEST BLOOD SUGAR TWICE DAILY AS NEEDED   True Metrix Meter w/Device Kit USE AS DIRECTED   TRUEplus Lancets 33G Misc TEST BLOOD SUGAR TWICE DAILY AS NEEDED   VITAMIN D PO Take 1 capsule by mouth daily. 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   Posterior vitreous detachment of both eyes 05/27/2017   Past Surgical History:  Procedure Laterality Date   APPENDECTOMY     CATARACT EXTRACTION Right    OVARIAN CYST REMOVAL     Family History  Problem Relation Age of Onset   Diabetes Mother    Heart attack Father 25       deceased   Cancer Sister        brain tumor   Cancer Brother        prostrate and lung CA   COPD Sister    Social History   Socioeconomic History   Marital status: Widowed    Spouse name: Not on file   Number of children: 3   Years of education: 70   Highest education level: 12th grade  Occupational History   Occupation: CNA    Comment: retired  Tobacco Use   Smoking status: Former    Types: Cigarettes    Quit date: 11/11/1980    Years since quitting: 42.2   Smokeless tobacco: Never  Substance and Sexual Activity   Alcohol use: No   Drug use: No   Sexual activity: Never  Other Topics Concern   Not on file  Social History Narrative   Lives alone. Helps a friend out with errands on Mondays and Thursdays. She has three children. She enjoys reading, playing game on her phone, church,  gardening and watching television.   Social Determinants of Health   Financial Resource Strain: Low Risk  (02/03/2023)   Overall Financial Resource Strain (CARDIA)    Difficulty of Paying Living Expenses: Not hard at all  Food Insecurity: No Food Insecurity (02/03/2023)   Hunger Vital Sign    Worried About Running Out of Food in the Last Year: Never true    Ran Out of Food in the Last Year: Never true  Transportation Needs: No Transportation Needs (02/03/2023)   PRAPARE - Hydrologist (Medical): No  Lack of Transportation (Non-Medical): No  Physical Activity: Inactive (02/03/2023)   Exercise Vital Sign    Days of Exercise per Week: 0 days    Minutes of Exercise per Session: 0 min  Stress: No Stress Concern Present (02/03/2023)   [place]    Feeling of Stress : Not at all  Social Connections: Moderately Integrated (02/03/2023)   Social Connection and Isolation Panel [NHANES]    Frequency of Communication with Friends and Family: More than three times a week    Frequency of Social Gatherings with Friends and Family: Twice a week    Attends Religious Services: More than 4 times per year    Active Member of [employer] or Organizations: Yes    Attends Archivist Meetings: More than 4 times per year    Marital Status: Widowed    Activities of Daily Living    02/03/2023   11:12 AM  In your present state of health, do you have any difficulty performing the following activities:  Hearing? 0  Vision? 0  Difficulty concentrating or making decisions? 0  Walking or climbing stairs? 0  Dressing or bathing? 0  Doing errands, shopping? 0  Preparing Food and eating ? N  Using the Toilet? N  In the past six months, have you accidently leaked urine? Y  Do you have problems with loss of bowel control? N  Managing your Medications? N  Managing your Finances? N  Housekeeping or managing your  Housekeeping? N    Patient Education/ Literacy How often do you need to have someone help you when you read instructions, pamphlets, or other written materials from your doctor or pharmacy?: 1 - Never What is the last grade level you completed in school?: 12th grade  Exercise Current Exercise Habits: The patient does not participate in regular exercise at present, Exercise limited by: None identified  Diet Patient reports consuming  2.5  meals a day and 2 snack(s) a day Patient reports that her primary diet is: Regular Patient reports that she does have regular access to food.   Depression Screen    02/03/2023   11:04 AM 09/18/2022   11:25 AM 03/12/2022   10:54 AM 01/28/2022   10:44 AM 12/26/2021   11:54 AM 12/08/2020    2:09 PM 05/04/2019   10:41 AM  PHQ 2/9 Scores  PHQ - 2 Score 0 0 0 0 0 0 0  PHQ- 9 Score      0 4     Fall Risk    02/03/2023   11:03 AM 12/20/2022    9:57 AM 09/18/2022   11:25 AM 03/12/2022   10:53 AM 01/28/2022   10:44 AM  Fall Risk   Falls in the past year? 0 0 0 0 0  Number falls in past yr: 0 0 0 0 0  Injury with Fall? 0 0 0 0 0  Risk for fall due to : No Fall Risks No Fall Risks No Fall Risks No Fall Risks No Fall Risks  Follow up Falls evaluation completed Falls evaluation completed Falls evaluation completed Falls evaluation completed Falls evaluation completed     Objective:  Lindsey Fischer seemed alert and oriented and she participated appropriately during our telephone visit.  Blood Pressure Weight BMI  BP Readings from Last 3 Encounters:  12/20/22 130/61  10/09/22 (!) 125/57  09/18/22 128/60   Wt Readings from Last 3 Encounters:  12/20/22 161 lb (73 kg)  09/18/22  162 lb 14.4 oz (73.9 kg)  06/18/22 163 lb (73.9 kg)   BMI Readings from Last 1 Encounters:  12/20/22 28.52 kg/m    *Unable to obtain current vital signs, weight, and BMI due to telephone visit type  Hearing/Vision  Kashena did not seem to have difficulty with hearing/understanding  during the telephone conversation Reports that she has had a formal eye exam by an eye care professional within the past year Reports that she has not had a formal hearing evaluation within the past year *Unable to fully assess hearing and vision during telephone visit type  Cognitive Function:    02/03/2023   11:15 AM 01/28/2022   10:55 AM 12/08/2020    2:18 PM  6CIT Screen  What Year? 0 points 0 points 0 points  What month? 0 points 0 points 0 points  What time? 0 points 0 points 0 points  Count back from 20 0 points 0 points 0 points  Months in reverse 0 points 0 points 0 points  Repeat phrase 2 points 2 points 2 points  Total Score 2 points 2 points 2 points   (Normal:0-7, Significant for Dysfunction: >8)  Normal Cognitive Function Screening: Yes   Immunization & Health Maintenance Record Immunization History  Administered Date(s) Administered   Fluad Quad(high Dose 65+) 11/15/2019, 08/28/2020, 09/05/2021, 09/18/2022   Influenza Whole 09/11/2010   Influenza,inj,Quad PF,6+ Mos 07/16/2013, 07/24/2015, 08/18/2017, 08/03/2018   Influenza-Unspecified 07/31/2012, 08/04/2014, 08/25/2016   PFIZER(Purple Top)SARS-COV-2 Vaccination 03/20/2020, 04/10/2020   Pneumococcal Conjugate-13 01/20/2015   Pneumococcal Polysaccharide-23 09/11/2010   Tdap 03/05/2012    Health Maintenance  Topic Date Due   DTaP/Tdap/Td (2 - Td or Tdap) 03/05/2022   COVID-19 Vaccine (3 - Pfizer risk series) 01/06/2024 (Originally 05/08/2020)   Zoster Vaccines- Shingrix (1 of 2) 03/19/2024 (Originally 08/14/1960)   Diabetic kidney evaluation - eGFR measurement  06/19/2023   Diabetic kidney evaluation - Urine ACR  06/19/2023   HEMOGLOBIN A1C  06/20/2023   FOOT EXAM  09/19/2023   OPHTHALMOLOGY EXAM  01/08/2024   Medicare Annual Wellness (AWV)  02/03/2024   MAMMOGRAM  03/14/2024   Pneumonia Vaccine 58+ Years old  Completed   INFLUENZA VACCINE  Completed   DEXA SCAN  Completed   HPV VACCINES  Aged Out        Assessment  This is a routine wellness examination for [employer].  Health Maintenance: Due or Overdue Health Maintenance Due  Topic Date Due   DTaP/Tdap/Td (2 - Td or Tdap) 03/05/2022    Lindsey Fischer does not need a referral for Community Assistance: Care Management:   no Social Work:    no Prescription Assistance:  no Nutrition/Diabetes Education:  no   Plan:  Personalized Goals  Goals Addressed               This Visit's Progress     Patient Stated (pt-stated)        02/03/2023 AWV Goal: Diabetes Management  Patient will maintain an A1C level below 8.0 Patient will not develop any diabetic foot complications Patient will not experience any hypoglycemic episodes over the next 3 months Patient will notify our office of any CBG readings outside of the provider recommended range by calling [phone] Patient will adhere to provider recommendations for diabetes management  Patient Self Management Activities take all medications as prescribed and report any negative side effects monitor and record blood sugar readings as directed adhere to a low carbohydrate diet that incorporates lean proteins, vegetables, whole  grains, low glycemic fruits check feet daily noting any sores, cracks, injuries, or callous formations see PCP or podiatrist if she notices any changes in her legs, feet, or toenails Patient will visit PCP and have an A1C level checked every 3 to 6 months as directed  have a yearly eye exam to monitor for vascular changes associated with diabetes and will request that the report be sent to her pcp.  consult with her PCP regarding any changes in her health or new or worsening symptoms        Personalized Health Maintenance & Screening Recommendations  Td vaccine Shingrix vaccine  Lung Cancer Screening Recommended: no (Low Dose CT Chest recommended if Age 38-80 years, 30 pack-year currently smoking OR have quit w/in past 15 years) Hepatitis C  Screening recommended: no HIV Screening recommended: no  Advanced Directives: Written information was not prepared per patient's request.  Referrals & Orders No orders of the defined types were placed in this encounter.   Follow-up Plan Follow-up with [name], PA-C as planned Discuss bone density due date with PCP. Medicare wellness visit in one year.  AVS printed and mailed to the patient.   I have personally reviewed and noted the following in the patient's chart:   Medical and social history Use of alcohol, tobacco or illicit drugs  Current medications and supplements Functional ability and status Nutritional status Physical activity Advanced directives List of other physicians Hospitalizations, surgeries, and ER visits in previous 12 months Vitals Screenings to include cognitive, depression, and falls Referrals and appointments  In addition, I have reviewed and discussed with Lindsey Fischer certain preventive protocols, quality metrics, and best practice recommendations. A written personalized care plan for preventive services as well as general preventive health recommendations is available and can be mailed to the patient at her request.      Tinnie Gens, RN BSN  02/03/2023

## 2023-02-04 ENCOUNTER — Other Ambulatory Visit: Payer: Self-pay | Admitting: Pharmacist

## 2023-02-04 NOTE — Progress Notes (Signed)
02/04/2023 Name: Lindsey Fischer MRN: [id number] DOB: Oct 19, 1941  Chief Complaint  Patient presents with   Medication Assistance    Lindsey Fischer is a 82 y.o. year old female who presented for a telephone visit.   They were referred to the pharmacist by  their medicare wellness visit  for assistance in managing medication access.    Subjective:  Care Team: Primary Care Provider: Lavada Mesi   Medication Access/Adherence  Current Pharmacy:  [id number] [name], [place] [address] Phone: [phone] Fax: [phone]  [place], [place] [address] Phone: [phone] Fax: [phone]   Patient reports affordability concerns with their medications: Yes , patient receives farxiga via patient assistance and was seeking information for other cost reduction strategies, particularly inhalers.   Asthma:  Current medications: wixela 250-50 1 puff BID, albuterol inhaler PRN, duonebs PRN Medications tried in the past: advair  Reports 1 exacerbations in the past year  Current medication access support: none at present, patient states ~$127 for 90DS for each inhaler, which can be cost burdensome for her   Objective:  Lab Results  Component Value Date   HGBA1C 6.6 (A) 12/20/2022    Lab Results  Component Value Date   CREATININE 1.24 (H) 06/18/2022   BUN 27 (H) 06/18/2022   NA 135 06/18/2022   K 4.5 06/18/2022   CL 101 06/18/2022   CO2 26 06/18/2022    Lab Results  Component Value Date   CHOL 186 06/05/2021   HDL 51 06/05/2021   LDLCALC 88 06/05/2021   LDLDIRECT 100 (H) 09/03/2011   TRIG 349 (H) 06/05/2021   CHOLHDL 3.6 06/05/2021    Medications Reviewed Today     Reviewed by Darius Bump, RPH (Pharmacist) on 02/04/23 at [place] List Status: <None>   Medication Order Taking? Sig Documenting Provider  Last Dose Status Informant  albuterol (VENTOLIN HFA) 108 (90 Base) MCG/ACT inhaler [id number] No INHALE 2 PUFFS BY MOUTH ONCE DAILY AS DIRECTED [name], [name], PA-C Taking Active   Alcohol Swabs (B-D SINGLE USE SWABS REGULAR) PADS 0000000 No 1 application by Does not apply route daily as needed (to use with injections). [name], PA-C Taking Active   AMBULATORY NON FORMULARY MEDICATION [id number] No Nebulizer machine and tubing  Dx:  COPD exacerbation Bowen, Collene Leyden, DO Taking Active   Blood Glucose Monitoring Suppl (TRUE METRIX METER) w/Device KIT [id number] No USE AS DIRECTED [name], [name], PA-C Taking Active   dapagliflozin propanediol (FARXIGA) 10 MG TABS tablet [id number] No Take 1 tablet (10 mg total) by mouth daily. [name], PA-C Taking Active   ferrous sulfate 325 (65 FE) MG tablet [id number] No Take 325 mg by mouth daily with breakfast. [provider] Taking Active Self  fluticasone-salmeterol (WIXELA INHUB) 250-50 MCG/ACT AEPB [id number] No Inhale 1 puff into the lungs in the morning and at bedtime. [name], PA-C Taking Active   ipratropium-albuterol (DUONEB) 0.5-2.5 (3) MG/3ML SOLN [id number] No INHALE THE CONTENTS OF 1 VIAL VIA NEBULIZER EVERY 6 HOURS AS NEEDED [name], [name], PA-C Taking Active   lisinopril-hydrochlorothiazide (ZESTORETIC) 20-25 MG tablet [id number] No Take 1 tablet by mouth daily. [name], PA-C Taking Active   metFORMIN (GLUCOPHAGE) 500 MG tablet [id number] No Take 1 tablet (500 mg total) by mouth 2 (two) times daily with a meal. [name],  [name], PA-C Taking Active   Omega-3 Fatty Acids (FISH OIL) 1200 MG CAPS [id number] No Take 1 capsule by mouth daily. [provider] Taking Active Self           Med Note [name], [name]   Wed Jan 02, 2022  3:10 PM) Reports takes every other day   rosuvastatin (CRESTOR) 20 MG tablet [id number] No Take 1 tablet (20 mg total) by mouth daily. Needs labs [name], [place] Taking  Active   TRUE METRIX BLOOD GLUCOSE TEST test strip [id number] No TEST BLOOD SUGAR TWICE DAILY AS NEEDED [name], PA-C Taking Active   TRUEplus Lancets 33G MISC [id number] No TEST BLOOD SUGAR TWICE DAILY AS NEEDED [name], PA-C Taking Active   VITAMIN D PO [id number] No Take 1 capsule by mouth daily. Reports dose is 5000 IU [provider] Taking Active Self              Assessment/Plan:   Asthma: - Currently controlled.  - Reviewed appropriate inhaler technique. - Recommend to continue current medications  - Completed application for Medicare LIS Extra Help, Reentry number: [id number]   Follow Up Plan: 1 month to assess outcome of application, pt will receive letter in the mail in 2-4 weeks  Larinda Buttery, PharmD Clinical Pharmacist [place] [phone]

## 2023-02-04 NOTE — Patient Instructions (Signed)
Lindsey Fischer,  Thanks for speaking with me today! We completed your Medicare Extra Help application. Keep an eye out in the mail, you should receive a letter in 2-4 weeks with their result of the application. Fingers crossed, this will lower all of your medication costs if it goes through! If not, we will still work on a Plan B to get your inhalers at a more affordable rate for you. I will call you in about a month to see if you've received anything in the mail.  Take care, [name], PharmD Clinical Pharmacist [facility] [phone]

## 2023-02-13 ENCOUNTER — Other Ambulatory Visit: Payer: Self-pay | Admitting: Physician Assistant

## 2023-02-24 ENCOUNTER — Other Ambulatory Visit: Payer: Self-pay | Admitting: Physician Assistant

## 2023-02-24 DIAGNOSIS — E1122 Type 2 diabetes mellitus with diabetic chronic kidney disease: Secondary | ICD-10-CM

## 2023-03-07 ENCOUNTER — Other Ambulatory Visit: Payer: Medicare HMO | Admitting: Pharmacist

## 2023-03-07 ENCOUNTER — Telehealth: Payer: Self-pay | Admitting: Pharmacist

## 2023-03-07 NOTE — Progress Notes (Signed)
03/07/2023 Name: Lindsey Fischer MRN: [id number] DOB: 08/28/1941  Chief Complaint  Patient presents with   Medication Assistance    Lindsey Fischer is a 82 y.o. year old female who presented for a telephone visit.   They were referred to the pharmacist by  their medicare wellness visit  for assistance in managing medication access.   Subjective:  Care Team: Primary Care Provider: Nolene Ebbs   Medication Access/Adherence  Current Pharmacy:  [id number] Lorenza Evangelist, [place] - [address] [place] [address] Phone: [phone] Fax: [phone]  [facility] - [place], [place] - [address] [place] [address] Phone: [phone] Fax: [phone]   Patient reports affordability concerns with their medications: Yes , patient receives farxiga via patient assistance and was seeking information for other cost reduction strategies, particularly inhalers.  Patient is approved for medicare [place] Extra Help program! Patient is approved at the level of full subsidy. This reduces her premiums, removes deductibles, and lowers the cost of all prescriptions.   Asthma:  Current medications: wixela 250-50 1 puff BID, albuterol inhaler PRN, duonebs PRN Medications tried in the past: advair  Reports 1 exacerbations in the past year  Current medication access support: none at present, patient states ~$127 for 90DS for each inhaler, which can be cost burdensome for her   Objective:  Lab Results  Component Value Date   HGBA1C 6.6 (A) 12/20/2022    Lab Results  Component Value Date   CREATININE 1.24 (H) 06/18/2022   BUN 27 (H) 06/18/2022   NA 135 06/18/2022   K 4.5 06/18/2022   CL 101 06/18/2022   CO2 26 06/18/2022    Lab Results  Component Value Date   CHOL 186 06/05/2021   HDL 51 06/05/2021   LDLCALC 88 06/05/2021   LDLDIRECT 100 (H) 09/03/2011   TRIG 349 (H) 06/05/2021   CHOLHDL  3.6 06/05/2021    Medications Reviewed Today     Reviewed by Gabriel Carina, RPH (Pharmacist) on 03/07/23 at 1055  Med List Status: <None>   Medication Order Taking? Sig Documenting Provider Last Dose Status Informant  albuterol (VENTOLIN HFA) 108 (90 Base) MCG/ACT inhaler [id number] No INHALE 2 PUFFS BY MOUTH ONCE DAILY AS DIRECTED [name], [name], PA-C Taking Active   Alcohol Swabs (B-D SINGLE USE SWABS REGULAR) PADS [id number] No 1 application by Does not apply route daily as needed (to use with injections). [name], PA-C Taking Active   AMBULATORY NON FORMULARY MEDICATION [id number] No Nebulizer machine and tubing  Dx:  COPD exacerbation Bowen, Scot Jun, DO Taking Active   Blood Glucose Monitoring Suppl (TRUE METRIX METER) w/Device KIT [id number] No USE AS DIRECTED [name], [name], PA-C Taking Active   dapagliflozin propanediol (FARXIGA) 10 MG TABS tablet [id number] No Take 1 tablet (10 mg total) by mouth daily. [name], PA-C Taking Active   ferrous sulfate 325 (65 FE) MG tablet [id number] No Take 325 mg by mouth daily with breakfast. [provider] Taking Active Self  fluticasone-salmeterol (WIXELA INHUB) 250-50 MCG/ACT AEPB [id number] No Inhale 1 puff into the lungs in the morning and at bedtime. [name], PA-C Taking Active   ipratropium-albuterol (DUONEB) 0.5-2.5 (3) MG/3ML SOLN [id number] No INHALE THE CONTENTS OF 1 VIAL VIA NEBULIZER EVERY 6 HOURS AS NEEDED [name], [name], PA-C Taking Active   lisinopril-hydrochlorothiazide (ZESTORETIC) 20-25 MG tablet [id number] No Take 1 tablet by mouth daily.  [name], PA-C Taking Active   metFORMIN (GLUCOPHAGE) 500 MG tablet [id number]  TAKE 1 TABLET TWICE DAILY WITH MEALS [name], [name], PA-C  Active   Omega-3 Fatty Acids (FISH OIL) 1200 MG CAPS [id number] No Take 1 capsule by mouth daily. [provider] Taking Active Self           Med Note [name], Edd [name] Jan 02, 2022  3:10 PM) Reports  takes every other day   rosuvastatin (CRESTOR) 20 MG tablet [id number]  TAKE 1 TABLET EVERY DAY (DUE FOR LABS) Agapito Games, MD  Active   TRUE METRIX BLOOD GLUCOSE TEST test strip [id number] No TEST BLOOD SUGAR TWICE DAILY AS NEEDED [name], PA-C Taking Active   TRUEplus Lancets 33G MISC [id number] No TEST BLOOD SUGAR TWICE DAILY AS NEEDED [name], [name], PA-C Taking Active   VITAMIN D PO [id number] No Take 1 capsule by mouth daily. Reports dose is 5000 IU [provider] Taking Active Self              Assessment/Plan:   Asthma: - Currently controlled.  - Reviewed appropriate inhaler technique. - Recommend to continue current medications  - Medicare LIS Extra Help approved, Reentry number: [id number]. This will lower her costs for all medications.    Follow Up Plan: 1 month to ensure subsidy has been fully processed on her medicare plan and implemented at pharmacy for prescription copays  Lynnda Shields, PharmD Clinical Pharmacist [facility] [phone]

## 2023-03-07 NOTE — Progress Notes (Signed)
Attempted to contact patient x2 for scheduled appointment for medication management. Left HIPAA compliant message for patient to return my call at their convenience.   Velna Hedgecock, PharmD, BCPS Clinical Pharmacist [facility]  

## 2023-03-25 ENCOUNTER — Encounter: Payer: Self-pay | Admitting: Physician Assistant

## 2023-03-25 ENCOUNTER — Other Ambulatory Visit: Payer: Self-pay | Admitting: Physician Assistant

## 2023-03-25 ENCOUNTER — Ambulatory Visit (INDEPENDENT_AMBULATORY_CARE_PROVIDER_SITE_OTHER): Payer: Medicare HMO | Admitting: Physician Assistant

## 2023-03-25 VITALS — BP 142/56 | HR 71 | Ht 63.0 in | Wt 163.0 lb

## 2023-03-25 DIAGNOSIS — N1831 Chronic kidney disease, stage 3a: Secondary | ICD-10-CM | POA: Diagnosis not present

## 2023-03-25 DIAGNOSIS — E1122 Type 2 diabetes mellitus with diabetic chronic kidney disease: Secondary | ICD-10-CM

## 2023-03-25 DIAGNOSIS — I10 Essential (primary) hypertension: Secondary | ICD-10-CM | POA: Diagnosis not present

## 2023-03-25 DIAGNOSIS — J4520 Mild intermittent asthma, uncomplicated: Secondary | ICD-10-CM | POA: Diagnosis not present

## 2023-03-25 DIAGNOSIS — N183 Chronic kidney disease, stage 3 unspecified: Secondary | ICD-10-CM

## 2023-03-25 DIAGNOSIS — R809 Proteinuria, unspecified: Secondary | ICD-10-CM

## 2023-03-25 DIAGNOSIS — Z7984 Long term (current) use of oral hypoglycemic drugs: Secondary | ICD-10-CM

## 2023-03-25 LAB — POCT UA - MICROALBUMIN
Creatinine, POC: 10 mg/dL
Microalbumin Ur, POC: 30 mg/L

## 2023-03-25 LAB — CBC WITH DIFFERENTIAL/PLATELET
Basophils Absolute: 60 cells/uL (ref 0–200)
Basophils Relative: 1.3 %
HCT: 38.2 % (ref 35.0–45.0)
MCV: 87.4 fL (ref 80.0–100.0)
Neutrophils Relative %: 25.8 %
RBC: 4.37 10*6/uL (ref 3.80–5.10)
RDW: 13.3 % (ref 11.0–15.0)

## 2023-03-25 LAB — POCT URINALYSIS DIP (CLINITEK)
Bilirubin, UA: NEGATIVE
Blood, UA: NEGATIVE
Glucose, UA: 100 mg/dL — AB
Ketones, POC UA: NEGATIVE mg/dL
Leukocytes, UA: NEGATIVE
Nitrite, UA: NEGATIVE
POC PROTEIN,UA: NEGATIVE
Spec Grav, UA: 1.015 (ref 1.010–1.025)
Urobilinogen, UA: 0.2 E.U./dL
pH, UA: 6 (ref 5.0–8.0)

## 2023-03-25 LAB — POCT GLYCOSYLATED HEMOGLOBIN (HGB A1C): Hemoglobin A1C: 6.2 % — AB (ref 4.0–5.6)

## 2023-03-25 MED ORDER — LISINOPRIL-HYDROCHLOROTHIAZIDE 20-25 MG PO TABS: 1.0000 | ORAL_TABLET | Freq: Every day | ORAL | 1 refills | Status: DC

## 2023-03-25 MED ORDER — IPRATROPIUM-ALBUTEROL 0.5-2.5 (3) MG/3ML IN SOLN
RESPIRATORY_TRACT | 3 refills | Status: AC
Start: 2023-03-25 — End: ?

## 2023-03-25 MED ORDER — DAPAGLIFLOZIN PROPANEDIOL 10 MG PO TABS
10.0000 mg | ORAL_TABLET | Freq: Every day | ORAL | 0 refills | Status: DC
Start: 2023-03-25 — End: 2023-06-25

## 2023-03-25 MED ORDER — IPRATROPIUM-ALBUTEROL 0.5-2.5 (3) MG/3ML IN SOLN: RESPIRATORY_TRACT | 3 refills | Status: DC

## 2023-03-25 MED ORDER — LISINOPRIL-HYDROCHLOROTHIAZIDE 20-25 MG PO TABS
1.0000 | ORAL_TABLET | Freq: Every day | ORAL | 1 refills | Status: DC
Start: 2023-03-25 — End: 2023-06-25

## 2023-03-25 MED ORDER — DAPAGLIFLOZIN PROPANEDIOL 10 MG PO TABS: 10.0000 mg | ORAL_TABLET | Freq: Every day | ORAL | 0 refills | Status: DC

## 2023-03-25 NOTE — Progress Notes (Signed)
Established Patient Office Visit  Subjective   Patient ID: Lindsey Fischer, female    DOB: 1940/12/22  Age: 82 y.o. MRN: [id number]  Chief Complaint  Patient presents with   Follow-up    HPI Pt is a 82 yo female with T2DM, HTN, HLD, asthma who presents to the clinic for 3 month follow up.   Pt is not checking sugars. She denies any hypoglycemic events. No open sores or wounds. She concerned about metoformin and her kidneys. She is taking her medications daily. No CP, palpitations, headaches or vision changes. She is trying to eat healthy and stay active.    She has started thinking more about umbilical hernia repair. She cannot remember the provider she went to.   .. 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   Posterior vitreous detachment of both eyes 05/27/2017   CKD (chronic kidney disease) stage 3, GFR 30-59 ml/min (HCC) 06/03/2017   Neck stiffness 08/20/2017   Numbness 08/20/2017   Chronic neck pain 01/16/2018   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   Cervical radiculopathy 01/29/2018   Ingrown toenail of left foot 07/31/2018   Nausea 07/02/2019   BPPV (benign paroxysmal positional vertigo), right 02/18/2020   Hypertriglyceridemia 02/18/2020   Seborrheic dermatitis 05/22/2020   Atopic dermatitis 08/28/2020   Rib pain on left side 12/08/2020   Age-related cataract of left eye 03/06/2021   Hypertension  associated with diabetes (HCC) 06/11/2021   Left lower quadrant abdominal pain 12/26/2021   Diverticulitis 12/28/2021   Microalbuminuria 06/18/2022   Resolved Ambulatory Problems    Diagnosis Date Noted   Dyslipidemia 01/19/2010   GRIEF REACTION, ACUTE 03/27/2010   Impaired fasting glucose 03/27/2010   Hypoxemia 05/22/2010   Pneumonia 05/06/2011   Metabolic syndrome X 03/05/2012   Epigastric abdominal tenderness with rebound tenderness 12/19/2015   Non-intractable cyclical vomiting with nausea 12/19/2015   Abdominal guarding 12/19/2015   Diarrhea 12/19/2015   Helicobacter pylori gastritis 12/20/2015   Atypical chest pain 10/22/2016   Neck muscle spasm 08/20/2017   Other headache syndrome 08/20/2017   Pneumonia of both lower lobes due to infectious organism 07/06/2019   Past Medical History:  Diagnosis Date   Asthma    Diabetes mellitus    Hyperlipidemia    Hypertension      Review of Systems  All other systems reviewed and are negative.     Objective:     BP (!) 142/56   Pulse 71   Ht 5\' 3"  (1.6 m)   Wt 163 lb (73.9 kg)   SpO2 99%   BMI 28.87 kg/m  BP Readings from Last 3 Encounters:  03/25/23 (!) 142/56  12/20/22 130/61  10/09/22 (!) 125/57   Wt Readings from Last 3 Encounters:  03/25/23 163 lb (73.9 kg)  12/20/22 161 lb (73 kg)  09/18/22 162 lb 14.4 oz (73.9 kg)    .Marland Kitchen Results for orders placed or performed in visit on 03/25/23  POCT HgB A1C  Result  Value Ref Range   Hemoglobin A1C 6.2 (A) 4.0 - 5.6 %   HbA1c POC (<> result, manual entry)     HbA1c, POC (prediabetic range)     HbA1c, POC (controlled diabetic range)    POCT UA - Microalbumin  Result Value Ref Range   Microalbumin Ur, POC 30 mg/L   Creatinine, POC 10 mg/dL   Albumin/Creatinine Ratio, Urine, POC 30-300   POCT URINALYSIS DIP (CLINITEK)  Result Value Ref Range   Color, UA yellow yellow   Clarity, UA clear clear   Glucose, UA =100 (A) negative mg/dL   Bilirubin, UA negative  negative   Ketones, POC UA negative negative mg/dL   Spec Grav, UA 2.952 8.413 - 1.025   Blood, UA negative negative   pH, UA 6.0 5.0 - 8.0   POC PROTEIN,UA negative negative, trace   Urobilinogen, UA 0.2 0.2 or 1.0 E.U./dL   Nitrite, UA Negative Negative   Leukocytes, UA Negative Negative     Physical Exam Constitutional:      Appearance: Normal appearance.  HENT:     Head: Normocephalic.  Cardiovascular:     Rate and Rhythm: Normal rate.     Heart sounds: Murmur heard.  Pulmonary:     Effort: Pulmonary effort is normal.     Breath sounds: Normal breath sounds.  Musculoskeletal:     Right lower leg: No edema.     Left lower leg: No edema.  Neurological:     General: No focal deficit present.     Mental Status: She is alert and oriented to person, place, and time.  Psychiatric:        Mood and Affect: Mood normal.      Assessment & Plan:  .Sumiye was seen today for follow-up.  Diagnoses and all orders for this visit:  Type 2 diabetes mellitus with stage 3a chronic kidney disease, without long-term current use of insulin (HCC) -     POCT HgB A1C -     Discontinue: dapagliflozin propanediol (FARXIGA) 10 MG TABS tablet; Take 1 tablet (10 mg total) by mouth daily. -     Lipid Panel w/reflex Direct LDL -     CBC w/Diff/Platelet -     POCT URINALYSIS DIP (CLINITEK) -     dapagliflozin propanediol (FARXIGA) 10 MG TABS tablet; Take 1 tablet (10 mg total) by mouth daily.  Mild intermittent asthma without complication -     Discontinue: ipratropium-albuterol (DUONEB) 0.5-2.5 (3) MG/3ML SOLN; INHALE THE CONTENTS OF 1 VIAL VIA NEBULIZER EVERY 6 HOURS AS NEEDED -     ipratropium-albuterol (DUONEB) 0.5-2.5 (3) MG/3ML SOLN; INHALE THE CONTENTS OF 1 VIAL VIA NEBULIZER EVERY 6 HOURS AS NEEDED  Essential hypertension, benign -     Discontinue: dapagliflozin propanediol (FARXIGA) 10 MG TABS tablet; Take 1 tablet (10 mg total) by mouth daily. -     Discontinue:  lisinopril-hydrochlorothiazide (ZESTORETIC) 20-25 MG tablet; Take 1 tablet by mouth daily. -     COMPLETE METABOLIC PANEL WITH GFR -     CBC w/Diff/Platelet -     dapagliflozin propanediol (FARXIGA) 10 MG TABS tablet; Take 1 tablet (10 mg total) by mouth daily. -     lisinopril-hydrochlorothiazide (ZESTORETIC) 20-25 MG tablet; Take 1 tablet by mouth daily.  Microalbuminuria -     Discontinue: dapagliflozin propanediol (FARXIGA) 10 MG TABS tablet; Take 1 tablet (10 mg total) by mouth daily. -     COMPLETE METABOLIC PANEL WITH GFR -  CBC w/Diff/Platelet -     POCT UA - Microalbumin -     dapagliflozin propanediol (FARXIGA) 10 MG TABS tablet; Take 1 tablet (10 mg total) by mouth daily.  Stage 3 chronic kidney disease, unspecified whether stage 3a or 3b CKD (HCC) -     Discontinue: dapagliflozin propanediol (FARXIGA) 10 MG TABS tablet; Take 1 tablet (10 mg total) by mouth daily. -     COMPLETE METABOLIC PANEL WITH GFR -     CBC w/Diff/Platelet -     dapagliflozin propanediol (FARXIGA) 10 MG TABS tablet; Take 1 tablet (10 mg total) by mouth daily.   Pt is doing great with breathing  On Wixella Using duoneb once a day  A1C improving Stay on same medication for now Discussed metformin concerns with patient Will recheck GFR and discuss with patient after Let her know that GFR under 30 is when we usually take it off On SGLT-2, abnormal microalbumin On statin On ACE, BP great Pneumonia/flu/covid UTD Needs Tdap get at pharmacy  Return in about 3 months (around 06/25/2023).    Tandy Gaw, PA-C

## 2023-03-25 NOTE — Patient Instructions (Signed)
Dr. Twana First for umbilical hernia repair.

## 2023-03-26 ENCOUNTER — Other Ambulatory Visit: Payer: Self-pay

## 2023-03-26 DIAGNOSIS — N183 Chronic kidney disease, stage 3 unspecified: Secondary | ICD-10-CM

## 2023-03-26 LAB — COMPLETE METABOLIC PANEL WITH GFR
AG Ratio: 1.6 (calc) (ref 1.0–2.5)
ALT: 17 U/L (ref 6–29)
AST: 24 U/L (ref 10–35)
Albumin: 4.3 g/dL (ref 3.6–5.1)
Alkaline phosphatase (APISO): 51 U/L (ref 37–153)
BUN/Creatinine Ratio: 19 (calc) (ref 6–22)
BUN: 28 mg/dL — ABNORMAL HIGH (ref 7–25)
CO2: 29 mmol/L (ref 20–32)
Calcium: 10.3 mg/dL (ref 8.6–10.4)
Chloride: 104 mmol/L (ref 98–110)
Creat: 1.49 mg/dL — ABNORMAL HIGH (ref 0.60–0.95)
Globulin: 2.7 g/dL (calc) (ref 1.9–3.7)
Glucose, Bld: 111 mg/dL — ABNORMAL HIGH (ref 65–99)
Potassium: 5 mmol/L (ref 3.5–5.3)
Sodium: 142 mmol/L (ref 135–146)
Total Bilirubin: 0.5 mg/dL (ref 0.2–1.2)
Total Protein: 7 g/dL (ref 6.1–8.1)
eGFR: 35 mL/min/{1.73_m2} — ABNORMAL LOW (ref >=60)

## 2023-03-26 LAB — CBC WITH DIFFERENTIAL/PLATELET
Absolute Monocytes: 570 cells/uL (ref 200–950)
Eosinophils Absolute: 221 cells/uL (ref 15–500)
Eosinophils Relative: 4.8 %
Hemoglobin: 12.6 g/dL (ref 11.7–15.5)
Lymphs Abs: 2562 cells/uL (ref 850–3900)
MCH: 28.8 pg (ref 27.0–33.0)
MCHC: 33 g/dL (ref 32.0–36.0)
MPV: 10.7 fL (ref 7.5–12.5)
Monocytes Relative: 12.4 %
Neutro Abs: 1187 cells/uL — ABNORMAL LOW (ref 1500–7800)
Platelets: 227 10*3/uL (ref 140–400)
Total Lymphocyte: 55.7 %
WBC: 4.6 10*3/uL (ref 3.8–10.8)

## 2023-03-26 LAB — LIPID PANEL W/REFLEX DIRECT LDL
Cholesterol: 172 mg/dL (ref <200)
HDL: 51 mg/dL (ref >=50)
LDL Cholesterol (Calc): 83 mg/dL (calc)
Non-HDL Cholesterol (Calc): 121 mg/dL (calc) (ref <130)
Total CHOL/HDL Ratio: 3.4 (calc) (ref <5.0)
Triglycerides: 291 mg/dL — ABNORMAL HIGH (ref <150)

## 2023-03-26 NOTE — Progress Notes (Signed)
How much water are you drinking? Kidneys look dry. Kidney function did drop a little this recheck. We could make the metformin change now you are close to when we do that. I want you to hydrate and recheck in 2 weeks.  LDL not quite to goal. Make sure to take crestor daily. To get to goal we could add an every 2 weeks injection to help lower cholesterol and CV risk. Would you like to discuss?

## 2023-04-01 ENCOUNTER — Other Ambulatory Visit: Payer: Medicare HMO | Admitting: Pharmacist

## 2023-04-01 NOTE — Progress Notes (Signed)
04/01/2023 Name: Lindsey Fischer MRN: [id number] DOB: 01-01-1941  Chief Complaint  Patient presents with   Medication Assistance    Lindsey Fischer is a 82 y.o. year old female who presented for a telephone visit.   They were referred to the pharmacist by  their medicare wellness visit  for assistance in managing medication access.   Subjective:  Care Team: Primary Care Provider: Nolene Ebbs   Medication Access/Adherence  Current Pharmacy:  [id number] [place] Evangelist, [place] - [address] [place] [address] Phone: [phone] Fax: [phone]  [facility] - [place], [place] - [address] [place] [address] Phone: [phone] Fax: [phone]   Patient reports affordability concerns with their medications: Yes , patient receives farxiga via patient assistance and was seeking information for other cost reduction strategies, particularly inhalers.  Patient is approved for medicare LIS Extra Help program! Patient is approved at the level of full subsidy. This reduces her premiums, removes deductibles, and lowers the cost of all prescriptions.   Asthma:  Current medications: wixela 250-50 1 puff BID, albuterol inhaler PRN, duonebs PRN Medications tried in the past: advair  Reports 1 exacerbations in the past year  Current medication access support: none at present, patient states ~$127 for 90DS for each inhaler, which can be cost burdensome for her   Objective:  Lab Results  Component Value Date   HGBA1C 6.2 (A) 03/25/2023    Lab Results  Component Value Date   CREATININE 1.49 (H) 03/25/2023   BUN 28 (H) 03/25/2023   NA 142 03/25/2023   K 5.0 03/25/2023   CL 104 03/25/2023   CO2 29 03/25/2023    Lab Results  Component Value Date   CHOL 172 03/25/2023   HDL 51 03/25/2023   LDLCALC 83 03/25/2023   LDLDIRECT 100 (H) 09/03/2011   TRIG 291 (H) 03/25/2023   CHOLHDL  3.4 03/25/2023    Medications Reviewed Today     Reviewed by Gabriel Carina, RPH (Pharmacist) on 04/01/23 at 1615  Med List Status: <None>   Medication Order Taking? Sig Documenting Provider Last Dose Status Informant  albuterol (VENTOLIN HFA) 108 (90 Base) MCG/ACT inhaler [id number] No INHALE 2 PUFFS BY MOUTH ONCE DAILY AS DIRECTED [name], [name], PA-C Taking Active   Alcohol Swabs (B-D SINGLE USE SWABS REGULAR) PADS [id number] No 1 application by Does not apply route daily as needed (to use with injections). [name], PA-C Taking Active   AMBULATORY NON FORMULARY MEDICATION [id number] No Nebulizer machine and tubing  Dx:  COPD exacerbation Bowen, Scot Jun, DO Taking Active   Blood Glucose Monitoring Suppl (TRUE METRIX METER) w/Device KIT [id number] No USE AS DIRECTED [name], [name], PA-C Taking Active   dapagliflozin propanediol (FARXIGA) 10 MG TABS tablet [id number]  Take 1 tablet (10 mg total) by mouth daily. [name], [name], PA-C  Active   empagliflozin (JARDIANCE) 10 MG TABS tablet [id number]  Take 1 tablet (10 mg total) by mouth daily. [name], PA-C  Active   ferrous sulfate 325 (65 FE) MG tablet [id number] No Take 325 mg by mouth daily with breakfast. [provider] Taking Active Self  fluticasone-salmeterol (WIXELA INHUB) 250-50 MCG/ACT AEPB [id number] No Inhale 1 puff into the lungs in the morning and at bedtime. [name], [name], PA-C Taking Active   ipratropium-albuterol (DUONEB) 0.5-2.5 (3) MG/3ML SOLN [id number]  INHALE THE CONTENTS OF 1 VIAL VIA NEBULIZER EVERY  6 HOURS AS NEEDED [name], [name], PA-C  Active   lisinopril-hydrochlorothiazide (ZESTORETIC) 20-25 MG tablet [id number]  Take 1 tablet by mouth daily. [name], PA-C  Active   metFORMIN (GLUCOPHAGE) 500 MG tablet [id number] No TAKE 1 TABLET TWICE DAILY WITH MEALS [name], [name], PA-C Taking Active   Omega-3 Fatty Acids (FISH OIL) 1200 MG CAPS [id number] No Take 1 capsule by mouth daily.  [provider] Taking Active Self           Med Note [name], Edd [name] Jan 02, 2022  3:10 PM) Reports takes every other day   rosuvastatin (CRESTOR) 20 MG tablet [id number] No TAKE 1 TABLET EVERY DAY (DUE FOR LABS) Agapito Games, MD Taking Active   TRUE METRIX BLOOD GLUCOSE TEST test strip [id number] No TEST BLOOD SUGAR TWICE DAILY AS NEEDED [name], PA-C Taking Active   TRUEplus Lancets 33G MISC [id number] No TEST BLOOD SUGAR TWICE DAILY AS NEEDED [name], [name], PA-C Taking Active   VITAMIN D PO [id number] No Take 1 capsule by mouth daily. Reports dose is 5000 IU [provider] Taking Active Self              Assessment/Plan:   Asthma: - Currently controlled, all is going well with the LIS extra help program. She did note that she received a letter in the mail from [place] as well as medicaid, and needs to call both of them back to see what they want. Otherwise no concerns or issues with medications at this time.  - Reviewed appropriate inhaler technique. - Recommend to continue current medications  - Medicare LIS Extra Help approved, Reentry number: [id number]. This will lower her costs for all medications.    Follow Up Plan: 3-4 months or PRN  Lynnda Shields, PharmD, BCPS Clinical Pharmacist [facility]

## 2023-04-03 ENCOUNTER — Other Ambulatory Visit: Payer: Medicare HMO | Admitting: Pharmacist

## 2023-04-03 NOTE — Progress Notes (Signed)
Care Coordination Call  Received incoming call from patient - inquired about medicare extra help program. She also received paperwork in the mail about medicaid enrollment. Answered patient questions regarding medicare extra help program - it is not auto-renewed, so near the end of the year we will re-enroll for 2025. They will send paperwork for the timeline of this.  Regarding medicaid, encouraged patient to contact the case manager she read to me on the letter, for ongoing support and questions about possible medicaid (as a secondary coverage) enrollment.  Patient verbalized understanding. Will outreach in fall 2024 for medicare LIS re-enrollment.  Lynnda Shields, PharmD, BCPS Clinical Pharmacist [facility]

## 2023-04-16 DIAGNOSIS — N183 Chronic kidney disease, stage 3 unspecified: Secondary | ICD-10-CM | POA: Diagnosis not present

## 2023-04-17 LAB — COMPLETE METABOLIC PANEL WITH GFR
AG Ratio: 1.6 (calc) (ref 1.0–2.5)
ALT: 14 U/L (ref 6–29)
AST: 26 U/L (ref 10–35)
Albumin: 4.1 g/dL (ref 3.6–5.1)
Alkaline phosphatase (APISO): 49 U/L (ref 37–153)
BUN/Creatinine Ratio: 19 (calc) (ref 6–22)
BUN: 25 mg/dL (ref 7–25)
CO2: 26 mmol/L (ref 20–32)
Calcium: 9.7 mg/dL (ref 8.6–10.4)
Chloride: 98 mmol/L (ref 98–110)
Creat: 1.33 mg/dL — ABNORMAL HIGH (ref 0.60–0.95)
Globulin: 2.5 g/dL (calc) (ref 1.9–3.7)
Glucose, Bld: 110 mg/dL — ABNORMAL HIGH (ref 65–99)
Potassium: 4.1 mmol/L (ref 3.5–5.3)
Sodium: 134 mmol/L — ABNORMAL LOW (ref 135–146)
Total Bilirubin: 0.5 mg/dL (ref 0.2–1.2)
Total Protein: 6.6 g/dL (ref 6.1–8.1)
eGFR: 40 mL/min/{1.73_m2} — ABNORMAL LOW (ref >=60)

## 2023-04-18 ENCOUNTER — Other Ambulatory Visit: Payer: Self-pay | Admitting: Physician Assistant

## 2023-04-18 NOTE — Progress Notes (Signed)
Morris,   Kidney function did improve. Continue to stay hydrated well. Kidneys are still on the dry side. Sodium just hair decreased. Will recheck in 3 months.

## 2023-04-30 ENCOUNTER — Other Ambulatory Visit: Payer: Self-pay | Admitting: Family Medicine

## 2023-05-12 ENCOUNTER — Other Ambulatory Visit: Payer: Self-pay | Admitting: Physician Assistant

## 2023-05-12 DIAGNOSIS — N1831 Chronic kidney disease, stage 3a: Secondary | ICD-10-CM

## 2023-06-25 ENCOUNTER — Encounter: Payer: Self-pay | Admitting: Physician Assistant

## 2023-06-25 ENCOUNTER — Ambulatory Visit (INDEPENDENT_AMBULATORY_CARE_PROVIDER_SITE_OTHER): Payer: Medicare HMO | Admitting: Physician Assistant

## 2023-06-25 VITALS — BP 141/58 | HR 60 | Ht 63.0 in | Wt 160.0 lb

## 2023-06-25 DIAGNOSIS — N1832 Chronic kidney disease, stage 3b: Secondary | ICD-10-CM

## 2023-06-25 DIAGNOSIS — R809 Proteinuria, unspecified: Secondary | ICD-10-CM

## 2023-06-25 DIAGNOSIS — N183 Chronic kidney disease, stage 3 unspecified: Secondary | ICD-10-CM | POA: Diagnosis not present

## 2023-06-25 DIAGNOSIS — Z7984 Long term (current) use of oral hypoglycemic drugs: Secondary | ICD-10-CM | POA: Diagnosis not present

## 2023-06-25 DIAGNOSIS — Z23 Encounter for immunization: Secondary | ICD-10-CM | POA: Diagnosis not present

## 2023-06-25 DIAGNOSIS — E1122 Type 2 diabetes mellitus with diabetic chronic kidney disease: Secondary | ICD-10-CM

## 2023-06-25 DIAGNOSIS — E785 Hyperlipidemia, unspecified: Secondary | ICD-10-CM

## 2023-06-25 DIAGNOSIS — I10 Essential (primary) hypertension: Secondary | ICD-10-CM | POA: Diagnosis not present

## 2023-06-25 DIAGNOSIS — N1831 Chronic kidney disease, stage 3a: Secondary | ICD-10-CM

## 2023-06-25 LAB — POCT GLYCOSYLATED HEMOGLOBIN (HGB A1C): Hemoglobin A1C: 6.5 % — AB (ref 4.0–5.6)

## 2023-06-25 MED ORDER — VALSARTAN-HYDROCHLOROTHIAZIDE 160-25 MG PO TABS
1.0000 | ORAL_TABLET | Freq: Every day | ORAL | 0 refills | Status: DC
Start: 2023-06-25 — End: 2023-09-26

## 2023-06-25 MED ORDER — EMPAGLIFLOZIN 10 MG PO TABS
10.0000 mg | ORAL_TABLET | Freq: Every day | ORAL | 3 refills | Status: DC
Start: 2023-06-25 — End: 2024-03-29

## 2023-06-25 NOTE — Addendum Note (Signed)
Addended by: Ernest Mallick A on: 06/25/2023 01:18 PM   Modules accepted: Orders

## 2023-06-25 NOTE — Patient Instructions (Signed)
 Chronic Kidney Disease, Adult Chronic kidney disease (CKD) occurs when the kidneys are slowly and permanently damaged over a long period of time. The kidneys are a pair of organs that do many important jobs in the body, including: Removing waste and extra fluid from the blood to make urine. Making hormones that maintain the amount of fluid in tissues and blood vessels. Maintaining the right amount of fluids and chemicals in the body. A small amount of kidney damage may not cause problems, but a large amount of damage may make it hard or impossible for the kidneys to work right. Steps must be taken to slow kidney damage or to stop it from getting worse. If steps are not taken, the kidneys may stop working permanently (end-stage renal disease, or ESRD). Most of the time, CKD does not go away, but it can often be controlled. People who have CKD are usually able to live full lives. What are the causes? The most common causes of this condition are diabetes and high blood pressure (hypertension). Other causes include: Cardiovascular diseases. These affect the heart and blood vessels. Kidney diseases. These include: Glomerulonephritis, or inflammation of the tiny filters in the kidneys. Interstitial nephritis. This is swelling of the small tubes of the kidneys and of the surrounding structures. Polycystic kidney disease, in which clusters of fluid-filled sacs form within the kidneys. Renal vascular disease. This includes disorders that affect the arteries and veins of the kidneys. Diseases that affect the body's defense system (immune system). A problem with urine flow. This may be caused by: Kidney stones. Cancer. An enlarged prostate, in males. A kidney infection or urinary tract infection (UTI) that keeps coming back. Vasculitis. This is swelling or inflammation of the blood vessels. What increases the risk? Your chances of having kidney disease increase with age. The following factors may make  you more likely to develop this condition: A family history of kidney disease or kidney failure. Kidney failure means the kidneys can no longer work right. Certain genetic diseases. Taking medicines often that are damaging to the kidneys. Being around or being in contact with toxic substances. Obesity. A history of tobacco use. What are the signs or symptoms? Symptoms of this condition include: Feeling very tired (lethargic) and having less energy. Swelling, or edema, of the face, legs, ankles, or feet. Nausea or vomiting, or loss of appetite. Confusion or trouble concentrating. Muscle twitches and cramps, especially in the legs. Dry, itchy skin. A metallic taste in the mouth. Producing less urine, or producing more urine (especially at night). Shortness of breath. Trouble sleeping. CKD may also result in not having enough red blood cells or hemoglobin in the blood (anemia) or having weak bones (bone disease). Symptoms develop slowly and may not be obvious until the kidney damage becomes severe. It is possible to have kidney disease for years without having symptoms. How is this diagnosed? This condition may be diagnosed based on: Blood tests. Urine tests. Imaging tests, such as an ultrasound or a CT scan. A kidney biopsy. This involves removing a sample of kidney tissue to be looked at under a microscope. Results from these tests will help to determine how serious the CKD is. How is this treated? There is no cure for most cases of this condition, but treatment usually relieves symptoms and prevents or slows the worsening of the disease. Treatment may include: Diet changes, which may require you to avoid alcohol and foods that are high in salt, potassium, phosphorous, and protein. Medicines. These may:  Lower blood pressure. Control blood sugar (glucose). Relieve anemia. Relieve swelling. Protect your bones. Improve the balance of salts and minerals in your blood  (electrolytes). Dialysis, which is a type of treatment that removes toxic waste from the body. It may be needed if you have kidney failure. Managing any other conditions that are causing your CKD or making it worse. Follow these instructions at home: Medicines Take over-the-counter and prescription medicines only as told by your health care provider. The amount of some medicines that you take may need to be changed. Do not take any new medicines unless approved by your health care provider. Many medicines can make kidney damage worse. Do not take any vitamin and mineral supplements unless approved by your health care provider. Many nutritional supplements can make kidney damage worse. Lifestyle  Do not use any products that contain nicotine or tobacco, such as cigarettes, e-cigarettes, and chewing tobacco. If you need help quitting, ask your health care provider. If you drink alcohol: Limit how much you use to: 0-1 drink a day for women who are not pregnant. 0-2 drinks a day for men. Know how much alcohol is in your drink. In the [place] one drink equals one 12 oz bottle of beer (355 mL), one 5 oz glass of wine (148 mL), or one 1 oz glass of hard liquor (44 mL). Maintain a healthy weight. If you need help, ask your health care provider. General instructions  Follow instructions from your health care provider about eating or drinking restrictions, including any prescribed diet. Track your blood pressure at home. Report changes in your blood pressure as told. If you are being treated for diabetes, track your blood glucose levels as told. Start or continue an exercise plan. Exercise at least 30 minutes a day, 5 days a week. Keep your immunizations up to date as told. Keep all follow-up visits. This is important. Where to find more information American Association of Kidney Patients: [employer]: www.kidney.org American [employer]: [employer]Match[employer].ee Life Options:  www.lifeoptions.org Kidney [employer]: www.kidneyschool.org Contact a health care provider if: Your symptoms get worse. You develop new symptoms. Get help right away if: You develop symptoms of ESRD. These include: Headaches. Numbness in your hands or feet. Easy bruising. Frequent hiccups. Chest pain. Shortness of breath. Lack of menstrual periods, in women. You have a fever. You are producing less urine than usual. You have pain or bleeding when you urinate or when you have a bowel movement. These symptoms may represent a serious problem that is an emergency. Do not wait to see if the symptoms will go away. Get medical help right away. Call your local emergency services (911 in the [place] Do not drive yourself to the hospital. Summary Chronic kidney disease (CKD) occurs when the kidneys become damaged slowly over a long period of time. The most common causes of this condition are diabetes and high blood pressure (hypertension). There is no cure for most cases of CKD, but treatment usually relieves symptoms and prevents or slows the worsening of the disease. Treatment may include a combination of lifestyle changes, medicines, and dialysis. This information is not intended to replace advice given to you by your health care provider. Make sure you discuss any questions you have with your health care provider. Document Revised: 01/28/2020 Document Reviewed: 02/02/2020 Elsevier Patient Education  2024 [employer].

## 2023-06-25 NOTE — Progress Notes (Signed)
Established Patient Office Visit  Subjective   Patient ID: Lindsey Fischer, female    DOB: 24-Aug-1941  Age: 82 y.o. MRN: [id number]  Chief Complaint  Patient presents with   Medical Management of Chronic Issues    Last A1c 6.2    HPI Pt is a 82 yo female with T2DM, CKD, HTN, HLD, Asthma who presents to the clinic for follow up.   She is doing well. Not checking sugars. She is compliant with medications. She is not checking BP at home. She denies any CP, palpitations, headaches or vision changes. She denies any hypoglycemic events. She is active. She is watching sugar and carbs in diet.   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   Posterior vitreous detachment of both eyes 05/27/2017   CKD (chronic kidney disease) stage 3, GFR 30-59 ml/min (HCC) 06/03/2017   Neck stiffness 08/20/2017   Numbness 08/20/2017   Chronic neck pain 01/16/2018   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   Cervical radiculopathy 01/29/2018   Ingrown toenail of left foot 07/31/2018   Nausea 07/02/2019   BPPV (benign paroxysmal positional vertigo), right 02/18/2020   Hypertriglyceridemia 02/18/2020   Seborrheic dermatitis 05/22/2020   Atopic dermatitis 08/28/2020   Rib pain on left side 12/08/2020   Age-related cataract of left eye 03/06/2021   Essential hypertension with goal blood pressure less than 130/80 06/11/2021   Left lower quadrant  abdominal pain 12/26/2021   Diverticulitis 12/28/2021   Microalbuminuria 06/18/2022   Resolved Ambulatory Problems    Diagnosis Date Noted   Dyslipidemia 01/19/2010   GRIEF REACTION, ACUTE 03/27/2010   Impaired fasting glucose 03/27/2010   Hypoxemia 05/22/2010   Pneumonia 05/06/2011   Metabolic syndrome X 03/05/2012   Epigastric abdominal tenderness with rebound tenderness 12/19/2015   Non-intractable cyclical vomiting with nausea 12/19/2015   Abdominal guarding 12/19/2015   Diarrhea 12/19/2015   Helicobacter pylori gastritis 12/20/2015   Atypical chest pain 10/22/2016   Neck muscle spasm 08/20/2017   Other headache syndrome 08/20/2017   Pneumonia of both lower lobes due to infectious organism 07/06/2019   Past Medical History:  Diagnosis Date   Asthma    Diabetes mellitus    Hyperlipidemia    Hypertension      ROS See HPI.    Objective:     BP (!) 141/58   Pulse 60   Ht 5\' 3"  (1.6 m)   Wt 160 lb (72.6 kg)   SpO2 98%   BMI 28.34 kg/m  BP Readings from Last 3 Encounters:  06/25/23 (!) 141/58  03/25/23 (!) 142/56  12/20/22 130/61   Wt Readings from Last 3 Encounters:  06/25/23 160 lb (72.6 kg)  03/25/23 163 lb (73.9 kg)  12/20/22 161 lb (73 kg)      Physical Exam Constitutional:      Appearance: Normal appearance.  HENT:     Head: Normocephalic.  Cardiovascular:     Rate and Rhythm: Normal rate and regular rhythm.  Heart sounds: Murmur heard.  Pulmonary:     Effort: Pulmonary effort is normal.     Breath sounds: Normal breath sounds.  Musculoskeletal:     Right lower leg: No edema.     Left lower leg: No edema.  Neurological:     Mental Status: She is alert.  Psychiatric:        Mood and Affect: Mood normal.        Assessment & Plan:  .Lindsey Fischer was seen today for medical management of chronic issues.  Diagnoses and all orders for this visit:  Type 2 diabetes mellitus with stage 3b chronic kidney disease, without long-term current use  of insulin (HCC) -     POCT HgB A1C  Stage 3b chronic kidney disease (HCC) -     CMP14+EGFR -     POCT HgB A1C  ESSENTIAL HYPERTENSION, BENIGN -     valsartan-hydrochlorothiazide (DIOVAN-HCT) 160-25 MG tablet; Take 1 tablet by mouth daily. -     empagliflozin (JARDIANCE) 10 MG TABS tablet; Take 1 tablet (10 mg total) by mouth daily.  Dyslipidemia, goal LDL below 70  Type 2 diabetes mellitus with stage 3a chronic kidney disease, without long-term current use of insulin (HCC) -     empagliflozin (JARDIANCE) 10 MG TABS tablet; Take 1 tablet (10 mg total) by mouth daily.  Microalbuminuria -     empagliflozin (JARDIANCE) 10 MG TABS tablet; Take 1 tablet (10 mg total) by mouth daily.  Stage 3 chronic kidney disease, unspecified whether stage 3a or 3b CKD (HCC) -     empagliflozin (JARDIANCE) 10 MG TABS tablet; Take 1 tablet (10 mg total) by mouth daily.  Essential hypertension with goal blood pressure less than 130/80   A1C to goal  Continue same medications(metformin and jardiance) BP not to goal- diastolic BP will avoid CCB changed BP medication to diovan/HCT Recheck in 2 weeks with nurse visit On statin Foot and eye exam UTD Flu shot given today Needs to get Tdap at pharmacy  Discussed CKD and diet She avoid NSAIDs Drink plenty of fluids On ARB and SGLT-2 for prevention Keep BP under 130/80-changed BP meds today Printed diet for CKD Recheck CMP today   Return in 3 months (on 09/25/2023) for 2 week nurse BP check/ 3 month office visit.    Tandy Gaw, PA-C

## 2023-06-26 LAB — CMP14+EGFR
ALT: 17 IU/L (ref 0–32)
AST: 23 IU/L (ref 0–40)
Albumin: 4.3 g/dL (ref 3.7–4.7)
Alkaline Phosphatase: 61 IU/L (ref 44–121)
BUN/Creatinine Ratio: 18 (ref 12–28)
BUN: 26 mg/dL (ref 8–27)
Bilirubin Total: 0.4 mg/dL (ref 0.0–1.2)
CO2: 25 mmol/L (ref 20–29)
Calcium: 10.3 mg/dL (ref 8.7–10.3)
Chloride: 102 mmol/L (ref 96–106)
Creatinine, Ser: 1.43 mg/dL — ABNORMAL HIGH (ref 0.57–1.00)
Globulin, Total: 2.3 g/dL (ref 1.5–4.5)
Glucose: 116 mg/dL — ABNORMAL HIGH (ref 70–99)
Potassium: 4.2 mmol/L (ref 3.5–5.2)
Sodium: 142 mmol/L (ref 134–144)
Total Protein: 6.6 g/dL (ref 6.0–8.5)
eGFR: 37 mL/min/{1.73_m2} — ABNORMAL LOW (ref >59)

## 2023-06-26 NOTE — Progress Notes (Signed)
[name],   Sodium and potassium look good.  Kidneys are dry. Drink more water.  Recheck in 3 months.   Try to keep diet for kidney disease, no anti-inflammatories, BP under 130/80.

## 2023-07-09 ENCOUNTER — Ambulatory Visit (INDEPENDENT_AMBULATORY_CARE_PROVIDER_SITE_OTHER): Payer: Medicare HMO | Admitting: Physician Assistant

## 2023-07-09 VITALS — BP 125/67 | HR 69 | Temp 98.8°F | Ht 63.0 in | Wt 158.0 lb

## 2023-07-09 DIAGNOSIS — I10 Essential (primary) hypertension: Secondary | ICD-10-CM

## 2023-07-09 NOTE — Progress Notes (Signed)
Patient is here for blood pressure check. Denies trouble sleeping, palpitations, dizziness, lightheadedness, blurry vision, chest pain, shortness of breath, headaches and/or medication problems.   Patient informed to schedule next appointment in 3 months for blood pressure check.

## 2023-07-29 ENCOUNTER — Other Ambulatory Visit: Payer: Self-pay | Admitting: Physician Assistant

## 2023-07-29 DIAGNOSIS — J4520 Mild intermittent asthma, uncomplicated: Secondary | ICD-10-CM

## 2023-08-01 NOTE — Telephone Encounter (Signed)
Pt called.  She is following up on refill for fluticasone propionate/salmeterol

## 2023-09-26 ENCOUNTER — Ambulatory Visit (INDEPENDENT_AMBULATORY_CARE_PROVIDER_SITE_OTHER): Payer: Medicare HMO | Admitting: Physician Assistant

## 2023-09-26 ENCOUNTER — Encounter: Payer: Self-pay | Admitting: Physician Assistant

## 2023-09-26 VITALS — BP 134/64 | HR 71 | Ht 63.0 in | Wt 158.0 lb

## 2023-09-26 DIAGNOSIS — Z7984 Long term (current) use of oral hypoglycemic drugs: Secondary | ICD-10-CM

## 2023-09-26 DIAGNOSIS — R42 Dizziness and giddiness: Secondary | ICD-10-CM | POA: Diagnosis not present

## 2023-09-26 DIAGNOSIS — N1832 Chronic kidney disease, stage 3b: Secondary | ICD-10-CM

## 2023-09-26 DIAGNOSIS — E7800 Pure hypercholesterolemia, unspecified: Secondary | ICD-10-CM

## 2023-09-26 DIAGNOSIS — E1122 Type 2 diabetes mellitus with diabetic chronic kidney disease: Secondary | ICD-10-CM | POA: Diagnosis not present

## 2023-09-26 DIAGNOSIS — I10 Essential (primary) hypertension: Secondary | ICD-10-CM | POA: Diagnosis not present

## 2023-09-26 LAB — POCT GLYCOSYLATED HEMOGLOBIN (HGB A1C): Hemoglobin A1C: 7.1 % — AB (ref 4.0–5.6)

## 2023-09-26 MED ORDER — VALSARTAN-HYDROCHLOROTHIAZIDE 160-25 MG PO TABS: 1.0000 | ORAL_TABLET | Freq: Every day | ORAL | 0 refills | Status: DC

## 2023-09-26 NOTE — Patient Instructions (Addendum)
Stop lisinopril. Continue on Diovan/HCT Recheck BP in 2 weeks

## 2023-09-26 NOTE — Progress Notes (Unsigned)
Established Patient Office Visit  Subjective   Patient ID: Lindsey Fischer, female    DOB: June 17, 1941  Age: 82 y.o. MRN: [id number]  Chief Complaint  Patient presents with   Medical Management of Chronic Issues    DM last A1C 6.5    HPI Pt is a 82 yo female with T2DM, HTN, HLD, Asthma who presents to the clinic for 3 month follow up.   Pt is doing well today. No concerns. Not checking sugars. She is very active. Denies any CP, palpitations, headaches or vision changes. She has been getting dizzy and lightheaded when standing. Not checking her BP. Started new BP medication, Diovan/HCT at last visit.  No falls.   .. 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   Posterior vitreous detachment of both eyes 05/27/2017   CKD (chronic kidney disease) stage 3, GFR 30-59 ml/min (HCC) 06/03/2017   Neck stiffness 08/20/2017   Numbness 08/20/2017   Chronic neck pain 01/16/2018   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   Cervical radiculopathy 01/29/2018   Ingrown toenail of left foot 07/31/2018   Nausea 07/02/2019   BPPV (benign paroxysmal positional vertigo), right 02/18/2020   Hypertriglyceridemia 02/18/2020   Seborrheic dermatitis 05/22/2020   Atopic dermatitis 08/28/2020   Rib pain on left side 12/08/2020   Age-related cataract of left eye 03/06/2021   Essential hypertension with goal blood pressure less than  130/80 06/11/2021   Left lower quadrant abdominal pain 12/26/2021   Diverticulitis 12/28/2021   Microalbuminuria 06/18/2022   Lightheadedness 09/29/2023   Resolved Ambulatory Problems    Diagnosis Date Noted   Dyslipidemia 01/19/2010   GRIEF REACTION, ACUTE 03/27/2010   Impaired fasting glucose 03/27/2010   Hypoxemia 05/22/2010   Pneumonia 05/06/2011   Metabolic syndrome X 03/05/2012   Epigastric abdominal tenderness with rebound tenderness 12/19/2015   Non-intractable cyclical vomiting with nausea 12/19/2015   Abdominal guarding 12/19/2015   Diarrhea 12/19/2015   Helicobacter pylori gastritis 12/20/2015   Atypical chest pain 10/22/2016   Neck muscle spasm 08/20/2017   Other headache syndrome 08/20/2017   Pneumonia of both lower lobes due to infectious organism 07/06/2019   Past Medical History:  Diagnosis Date   Asthma    Diabetes mellitus    Hyperlipidemia    Hypertension      ROS See HPI.    Objective:     BP 134/64 (BP Location: Left Arm, Patient Position: Sitting, Cuff Size: Normal)   Pulse 71   Ht 5\' 3"  (1.6 m)   Wt 158 lb (71.7 kg)   SpO2 98%   BMI 27.99 kg/m  BP Readings from Last 3 Encounters:  09/26/23 134/64  07/09/23 125/67  06/25/23 (!) 141/58   Wt Readings from Last 3 Encounters:  09/26/23 158 lb (71.7 kg)  07/09/23 158 lb (71.7 kg)  06/25/23 160 lb (72.6 kg)      Physical Exam Constitutional:      Appearance: Normal appearance.  HENT:  Head: Normocephalic.  Cardiovascular:     Rate and Rhythm: Normal rate and regular rhythm.  Pulmonary:     Effort: Pulmonary effort is normal.     Breath sounds: Normal breath sounds.  Musculoskeletal:     Cervical back: Normal range of motion and neck supple.     Right lower leg: No edema.     Left lower leg: No edema.  Neurological:     General: No focal deficit present.     Mental Status: She is alert and oriented to person, place, and time.  Psychiatric:        Mood and Affect: Mood  normal.    .. Results for orders placed or performed in visit on 09/26/23  CMP14+EGFR  Result Value Ref Range   Glucose 113 (H) 70 - 99 mg/dL   BUN 32 (H) 8 - 27 mg/dL   Creatinine, Ser 4.09 (H) 0.57 - 1.00 mg/dL   eGFR 38 (L) >81 XB/JYN/8.29   BUN/Creatinine Ratio 23 12 - 28   Sodium 136 134 - 144 mmol/L   Potassium 4.8 3.5 - 5.2 mmol/L   Chloride 100 96 - 106 mmol/L   CO2 22 20 - 29 mmol/L   Calcium 9.9 8.7 - 10.3 mg/dL   Total Protein 6.9 6.0 - 8.5 g/dL   Albumin 4.4 3.7 - 4.7 g/dL   Globulin, Total 2.5 1.5 - 4.5 g/dL   Bilirubin Total 0.3 0.0 - 1.2 mg/dL   Alkaline Phosphatase 68 44 - 121 IU/L   AST 25 0 - 40 IU/L   ALT 17 0 - 32 IU/L  POCT HgB A1C  Result Value Ref Range   Hemoglobin A1C 7.1 (A) 4.0 - 5.6 %   HbA1c POC (<> result, manual entry)     HbA1c, POC (prediabetic range)     HbA1c, POC (controlled diabetic range)          Assessment & Plan:  Lindsey Fischer was seen today for medical management of chronic issues.  Diagnoses and all orders for this visit:  Type 2 diabetes mellitus with stage 3b chronic kidney disease, without long-term current use of insulin (HCC) -     CMP14+EGFR -     Cancel: POCT UA - Microalbumin -     POCT HgB A1C  ESSENTIAL HYPERTENSION, BENIGN -     valsartan-hydrochlorothiazide (DIOVAN-HCT) 160-25 MG tablet; Take 1 tablet by mouth daily.  Stage 3b chronic kidney disease (HCC)  Pure hypercholesterolemia  Lightheadedness   A1C not to goal Continue same medications Watch sugars and carbs if not to goal in 3 months will consider adding another medication BP to goal but getting lightheaded upon questioning still taking lisinopril as well  Stop lisinopril and start diovan/HCT in the morning Nurse visit BP check in 2 weeks On statin Recheck cmp to manage kidney function Vaccines UTD, declined shingles and covid vaccine Follow up in 3 months    Return in about 2 weeks (around 10/10/2023) for nurse visit BP recheck/ 3 months with  me.    Tandy Gaw, PA-C

## 2023-09-27 LAB — CMP14+EGFR
ALT: 17 [IU]/L (ref 0–32)
AST: 25 [IU]/L (ref 0–40)
Albumin: 4.4 g/dL (ref 3.7–4.7)
Alkaline Phosphatase: 68 [IU]/L (ref 44–121)
BUN/Creatinine Ratio: 23 (ref 12–28)
BUN: 32 mg/dL — ABNORMAL HIGH (ref 8–27)
Bilirubin Total: 0.3 mg/dL (ref 0.0–1.2)
CO2: 22 mmol/L (ref 20–29)
Calcium: 9.9 mg/dL (ref 8.7–10.3)
Chloride: 100 mmol/L (ref 96–106)
Creatinine, Ser: 1.39 mg/dL — ABNORMAL HIGH (ref 0.57–1.00)
Globulin, Total: 2.5 g/dL (ref 1.5–4.5)
Glucose: 113 mg/dL — ABNORMAL HIGH (ref 70–99)
Potassium: 4.8 mmol/L (ref 3.5–5.2)
Sodium: 136 mmol/L (ref 134–144)
Total Protein: 6.9 g/dL (ref 6.0–8.5)
eGFR: 38 mL/min/{1.73_m2} — ABNORMAL LOW (ref >59)

## 2023-09-29 ENCOUNTER — Encounter: Payer: Self-pay | Admitting: Physician Assistant

## 2023-09-29 DIAGNOSIS — R42 Dizziness and giddiness: Secondary | ICD-10-CM | POA: Insufficient documentation

## 2023-09-29 NOTE — Progress Notes (Signed)
Kidney function is stable. Your kidney still look dry. Drink more water! Increase by 20oz whatever you are drinking now.

## 2023-10-13 ENCOUNTER — Ambulatory Visit (INDEPENDENT_AMBULATORY_CARE_PROVIDER_SITE_OTHER): Payer: Medicare HMO | Admitting: Physician Assistant

## 2023-10-13 VITALS — BP 138/83 | HR 71 | Temp 97.9°F | Ht 63.0 in | Wt 162.0 lb

## 2023-10-13 DIAGNOSIS — I10 Essential (primary) hypertension: Secondary | ICD-10-CM

## 2023-10-13 NOTE — Progress Notes (Signed)
Patient is here for blood pressure check. Denies trouble sleeping, palpitations, dizziness, lightheadedness, blurry vision, chest pain, shortness of breath, headaches and/or medication problems.   Patient's blood pressure was out of goal range - 155/77, pulse 74. Patient sat for 15 minutes. Blood pressure recheck was at goal - 138/83, pulse 71. Provider notified of current blood pressure reading. Per provider, patient is to continue with valsartan/hydrochlorothiazide 160/25 mg..   Patient informed to schedule next appointment in 3 months.

## 2023-10-13 NOTE — Progress Notes (Signed)
Agree with below plan. Keep regular 3 month follow ups. Stay on same medications.

## 2023-10-14 ENCOUNTER — Other Ambulatory Visit: Payer: Self-pay

## 2023-10-14 ENCOUNTER — Encounter: Payer: Self-pay | Admitting: Emergency Medicine

## 2023-10-14 ENCOUNTER — Ambulatory Visit
Admission: EM | Admit: 2023-10-14 | Discharge: 2023-10-14 | Disposition: A | Discharge status: Home / Self Care | Payer: Medicare HMO | Attending: Family Medicine | Admitting: Family Medicine

## 2023-10-14 DIAGNOSIS — Z20822 Contact with and (suspected) exposure to covid-19: Secondary | ICD-10-CM | POA: Diagnosis not present

## 2023-10-14 LAB — POC SARS CORONAVIRUS 2 AG -  ED: SARS Coronavirus 2 Ag: NEGATIVE

## 2023-10-14 NOTE — ED Provider Notes (Signed)
[facility]    CSN: [id number] Arrival date & time: 10/14/23  1631      History   Chief Complaint Chief Complaint  Patient presents with   COVID testing    HPI Lindsey Fischer is a 82 y.o. female.   HPI Patient states she has had cold symptoms that are improving over the last week.  She is not feeling too bad today, however, she got a call from her sister that her sister is positive for COVID.  She spent time with her sister over the holiday.  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   Posterior vitreous detachment of both eyes 05/27/2017    Patient Active Problem List   Diagnosis Date Noted   Lightheadedness 09/29/2023   Microalbuminuria 06/18/2022   Diverticulitis 12/28/2021   Left lower quadrant abdominal pain 12/26/2021   Essential hypertension with goal blood pressure less than 130/80 06/11/2021   Age-related cataract of left eye 03/06/2021   Rib pain on left side 12/08/2020   Atopic dermatitis 08/28/2020   Seborrheic dermatitis 05/22/2020   BPPV (benign paroxysmal positional vertigo), right 02/18/2020   Hypertriglyceridemia 02/18/2020   Nausea 07/02/2019   Ingrown toenail of left foot 07/31/2018   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   Cervical radiculopathy 01/29/2018   Chronic neck pain 01/16/2018   Neck stiffness 08/20/2017   Numbness 08/20/2017   CKD (chronic kidney disease) stage 3, GFR 30-59 ml/min (HCC) 06/03/2017   Combined forms of age-related cataract of both eyes 05/27/2017   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   Posterior vitreous detachment of both eyes 05/27/2017   Elevated serum creatinine 04/23/2017   Pure hypercholesterolemia 04/22/2017   New daily  persistent headache 10/22/2016   Dyslipidemia, goal LDL below 70 04/22/2016   Diverticulosis 12/20/2015   Gastritis and gastroduodenitis 12/20/2015   Umbilical hernia 07/22/2014   Type 2 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      Home Medications    Prior to Admission medications   Medication Sig Start Date End Date Taking? Authorizing Provider  albuterol (VENTOLIN HFA) 108 (90 Base) MCG/ACT inhaler INHALE 2 PUFFS BY MOUTH ONCE DAILY AS DIRECTED 12/12/20  Yes [name], [name], PA-C  Alcohol Swabs (B-D SINGLE USE SWABS REGULAR) PADS 1 application by Does not apply route daily as needed (to use with injections). 07/10/20  Yes [name], [name], PA-C  AMBULATORY NON FORMULARY MEDICATION Nebulizer machine and tubing  Dx:  COPD exacerbation 05/06/11  Yes Bowen, Scot Jun, DO  Blood Glucose Monitoring Suppl (TRUE METRIX METER) w/Device KIT USE AS DIRECTED 10/12/20  Yes [name], [name], PA-C  empagliflozin (JARDIANCE) 10 MG TABS tablet Take 1 tablet (10 mg total) by mouth daily. 06/25/23  Yes [name], [name], PA-C  ferrous sulfate 325 (65 FE) MG tablet Take 325 mg by mouth daily  with breakfast.   Yes [provider]  fluticasone-salmeterol (ADVAIR) 250-50 MCG/ACT AEPB INHALE 1 PUFF INTO THE LUNGS IN THE MORNING AND AT BEDTIME. 08/01/23  Yes [name], [name], PA-C  ipratropium-albuterol (DUONEB) 0.5-2.5 (3) MG/3ML SOLN INHALE THE CONTENTS OF 1 VIAL VIA NEBULIZER EVERY 6 HOURS AS NEEDED 03/25/23  Yes [name], [name], PA-C  metFORMIN (GLUCOPHAGE) 500 MG tablet TAKE 1 TABLET TWICE DAILY WITH MEALS 05/12/23  Yes [name], [name], PA-C  Omega-3 Fatty Acids (FISH OIL) 1200 MG CAPS Take 1  capsule by mouth daily.   Yes [provider]  rosuvastatin (CRESTOR) 20 MG tablet TAKE 1 TABLET EVERY DAY (DUE FOR LABS) 04/30/23  Yes [name], [name], PA-C  TRUE METRIX BLOOD GLUCOSE TEST test strip TEST BLOOD SUGAR TWICE DAILY AS NEEDED 12/30/22  Yes [name], [name], PA-C  TRUEplus Lancets 33G MISC TEST BLOOD SUGAR TWICE DAILY AS NEEDED 09/25/22  Yes [name], [name], PA-C  valsartan-hydrochlorothiazide (DIOVAN-HCT) 160-25 MG tablet Take 1 tablet by mouth daily. 09/26/23  Yes [name], [name], PA-C  VITAMIN D PO Take 1 capsule by mouth daily. Reports dose is 5000 IU   Yes [provider]    Family History Family History  Problem Relation Age of Onset   Diabetes Mother    Heart attack Father 31       deceased   Cancer Sister        brain tumor   Cancer Brother        prostrate and lung CA   COPD Sister     Social History Social History   Tobacco Use   Smoking status: Former    Current packs/day: 0.00    Types: Cigarettes    Quit date: 11/11/1980    Years since quitting: 42.9   Smokeless tobacco: Never  Vaping Use   Vaping status: Never Used  Substance Use Topics   Alcohol use: No   Drug use: No     Allergies   Peanut-containing drug products, Livalo [pitavastatin], Mucinex [guaifenesin er], Prednisone, Zocor [simvastatin], Amlodipine, and Tramadol   Review of Systems Review of Systems See HPI  Physical Exam Triage Vital Signs ED Triage Vitals  Encounter Vitals Group     BP 10/14/23 1645 (!) 151/56     Systolic BP Percentile --      Diastolic BP Percentile --      Pulse Rate 10/14/23 1645 81     Resp 10/14/23 1645 16     Temp 10/14/23 1645 98.4 F (36.9 C)     Temp Source 10/14/23 1645 Oral     SpO2 10/14/23 1645 96 %     Weight 10/14/23 1646 162 lb (73.5 kg)     Height 10/14/23 1646 5\' 3"  (1.6 m)     Head Circumference --      Peak Flow --      Pain Score 10/14/23 1646 0     Pain Loc --      Pain Education --      Exclude from  Growth Chart --    No data found.  Updated Vital Signs BP (!) 151/56 (BP Location: Right Arm)   Pulse 81   Temp 98.4 F (36.9 C) (Oral)   Resp 16   Ht 5\' 3"  (1.6 m)   Wt 73.5 kg   SpO2 96%   BMI 28.70 kg/m        Physical Exam Constitutional:      General: She is not in  acute distress.    Appearance: She is well-developed and normal weight. She is not ill-appearing.  HENT:     Head: Normocephalic and atraumatic.  Eyes:     Conjunctiva/sclera: Conjunctivae normal.     Pupils: Pupils are equal, round, and reactive to light.  Cardiovascular:     Rate and Rhythm: Normal rate.  Pulmonary:     Effort: Pulmonary effort is normal. No respiratory distress.  Musculoskeletal:        General: Normal range of motion.     Cervical back: Normal range of motion.  Skin:    General: Skin is warm and dry.  Neurological:     Mental Status: She is alert.      UC Treatments / Results  Labs (all labs ordered are listed, but only abnormal results are displayed) Labs Reviewed  POC SARS CORONAVIRUS 2 AG -  ED    EKG   Radiology No results found.  Procedures Procedures (including critical care time)  Medications Ordered in UC Medications - No data to display  Initial Impression / Assessment and Plan / UC Course  I have reviewed the triage vital signs and the nursing notes.  Pertinent labs & imaging results that were available during my care of the patient were reviewed by me and considered in my medical decision making (see chart for details).     COVID test is negative. Final Clinical Impressions(s) / UC Diagnoses   Final diagnoses:  Exposure to confirmed case of COVID-19     Discharge Instructions      Your COVID test is negative  You may take COVID testing at home if you have new symptoms develop   ED Prescriptions   None    PDMP not reviewed this encounter.   Eustace Moore, MD 10/14/23 [phone]

## 2023-10-14 NOTE — ED Triage Notes (Signed)
Patient states that she ran a fever 6 days ago, thought it was a cold, cough, runny nose.  Around her sister on Thanksgiving, sister tested positive for COVID today.  Patient is requesting testing.

## 2023-10-14 NOTE — Discharge Instructions (Signed)
Your COVID test is negative  You may take COVID testing at home if you have new symptoms develop

## 2023-11-03 ENCOUNTER — Other Ambulatory Visit: Payer: Self-pay | Admitting: Physician Assistant

## 2023-12-17 ENCOUNTER — Other Ambulatory Visit: Payer: Self-pay | Admitting: Physician Assistant

## 2023-12-24 ENCOUNTER — Other Ambulatory Visit: Payer: Self-pay | Admitting: Physician Assistant

## 2023-12-24 DIAGNOSIS — I10 Essential (primary) hypertension: Secondary | ICD-10-CM

## 2023-12-29 ENCOUNTER — Encounter: Payer: Self-pay | Admitting: Physician Assistant

## 2023-12-29 ENCOUNTER — Ambulatory Visit (INDEPENDENT_AMBULATORY_CARE_PROVIDER_SITE_OTHER): Payer: Medicare HMO | Admitting: Physician Assistant

## 2023-12-29 VITALS — BP 142/76 | HR 73 | Ht 63.0 in | Wt 161.0 lb

## 2023-12-29 DIAGNOSIS — E785 Hyperlipidemia, unspecified: Secondary | ICD-10-CM

## 2023-12-29 DIAGNOSIS — Z7984 Long term (current) use of oral hypoglycemic drugs: Secondary | ICD-10-CM

## 2023-12-29 DIAGNOSIS — N1832 Chronic kidney disease, stage 3b: Secondary | ICD-10-CM

## 2023-12-29 DIAGNOSIS — E1122 Type 2 diabetes mellitus with diabetic chronic kidney disease: Secondary | ICD-10-CM

## 2023-12-29 DIAGNOSIS — I10 Essential (primary) hypertension: Secondary | ICD-10-CM | POA: Diagnosis not present

## 2023-12-29 DIAGNOSIS — J4520 Mild intermittent asthma, uncomplicated: Secondary | ICD-10-CM

## 2023-12-29 LAB — POCT GLYCOSYLATED HEMOGLOBIN (HGB A1C): Hemoglobin A1C: 6.4 % — AB (ref 4.0–5.6)

## 2023-12-29 NOTE — Patient Instructions (Signed)
 Keep up the good work

## 2023-12-29 NOTE — Progress Notes (Signed)
Established Patient Office Visit  Subjective   Patient ID: Lindsey Fischer, female    DOB: July 05, 1941  Age: 83 y.o. MRN: [id number]  HPI Patient is an 83 yo female with T2DM, HTN, HLD, Asthma who presents to the clinic for 3 month follow up.    Pt is doing well today. She is on Metformin 500mg  daily.She is checking sugars in the morning, she is fasting when she does this. 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 She state her diet has been stable, avoid potatoes and rice. She does eat a lot of chicken for her protein source.   Denies any CP, palpitations, headaches, but does endorses vision changes. She has an upcoming appointment with ophthalmologist in March.    She is checking her BP at home and reports it runs between 135-140s systolic. She is taking Diovan-HCT 25mg  daily. She feels like this medication is not helping her blood pressure. She is also taking Rosuvastatin 20mg  daily.   Review of Systems  Constitutional: Negative.   Eyes: Negative.   Respiratory:  Positive for cough.   Cardiovascular: Negative.   Gastrointestinal: Negative.   Genitourinary: Negative.   Musculoskeletal: Negative.   Skin: Negative.   Neurological: Negative.   Endo/Heme/Allergies: Negative.   Psychiatric/Behavioral: Negative.       Objective:    There were no vitals taken for this visit. BP Readings from Last 3 Encounters:  12/29/23 (!) 142/61  10/14/23 (!) 151/56  10/13/23 138/83   Wt Readings from Last 3 Encounters:  12/29/23 73 kg  10/14/23 73.5 kg  10/13/23 73.5 kg   Physical Exam Constitutional:      Appearance: Normal appearance.  HENT:     Head: Normocephalic and atraumatic.  Eyes:     Extraocular Movements: Extraocular movements intact.  Cardiovascular:     Rate and Rhythm: Normal rate and regular rhythm.     Pulses: Normal pulses.     Heart sounds: Normal heart sounds.  Musculoskeletal:        General: No swelling.      Cervical back: Normal range of motion.     Right lower leg: No edema.     Left lower leg: No edema.  Skin:    General: Skin is warm.  Neurological:     Mental Status: She is alert.  Psychiatric:        Mood and Affect: Mood normal.    Last CBC Lab Results  Component Value Date   WBC 4.6 03/25/2023   HGB 12.6 03/25/2023   HCT 38.2 03/25/2023   MCV 87.4 03/25/2023   MCH 28.8 03/25/2023   RDW 13.3 03/25/2023   PLT 227 03/25/2023    Last metabolic panel Lab Results  Component Value Date   GLUCOSE 113 (H) 09/26/2023   NA 136 09/26/2023   K 4.8 09/26/2023   CL 100 09/26/2023   CO2 22 09/26/2023   BUN 32 (H) 09/26/2023   CREATININE 1.39 (H) 09/26/2023   EGFR 38 (L) 09/26/2023   CALCIUM 9.9 09/26/2023   PROT 6.9 09/26/2023   ALBUMIN 4.4 09/26/2023   LABGLOB 2.5 09/26/2023   BILITOT 0.3 09/26/2023   ALKPHOS 68 09/26/2023   AST 25 09/26/2023   ALT 17 09/26/2023   Last lipids Lab Results  Component Value Date   CHOL 172 03/25/2023   HDL 51 03/25/2023   LDLCALC 83 03/25/2023   LDLDIRECT 100 (H) 09/03/2011   TRIG  291 (H) 03/25/2023   CHOLHDL 3.4 03/25/2023   Last hemoglobin A1c Lab Results  Component Value Date   HGBA1C 7.1 (A) 09/26/2023   Last thyroid functions Lab Results  Component Value Date   TSH 2.034 07/07/2012    The ASCVD Risk score (Arnett DK, et al., 2019) failed to calculate for the following reasons:   The 2019 ASCVD risk score is only valid for ages 66 to 50    Assessment & Plan:   Problem List Items Addressed This Visit   None - A1C today: 6.4% - Patient educated on incorporating protein into diet, avoiding sugars and starches. - Patient educated on blood pressure goal of <130/81mmHg.   - At home blood pressures have been 135/140s systolic.   - Patient told to continue blood pressure medication to help protect her kidneys and provide stability.   - BP rechecked in office: 142/34mmHg.  - Declined shingles, tetanus, and COVID-19  booster  - Patient told to get shingle vaccine at the pharmacy.  - She has an appt. with ophthalmologist on March 4th for worsening vision.  - Patient educated on the importance of daily exercise.  - Follow up in 3 months for labs.    Ilean China, Student-PA

## 2023-12-30 ENCOUNTER — Encounter: Payer: Self-pay | Admitting: Physician Assistant

## 2023-12-30 ENCOUNTER — Other Ambulatory Visit: Payer: Self-pay | Admitting: Physician Assistant

## 2023-12-30 LAB — LIPID PANEL
Chol/HDL Ratio: 3.7 {ratio} (ref 0.0–4.4)
Cholesterol, Total: 179 mg/dL (ref 100–199)
HDL: 48 mg/dL (ref >39)
LDL Chol Calc (NIH): 76 mg/dL (ref 0–99)
Triglycerides: 344 mg/dL — ABNORMAL HIGH (ref 0–149)
VLDL Cholesterol Cal: 55 mg/dL — ABNORMAL HIGH (ref 5–40)

## 2023-12-30 LAB — CMP14+EGFR
ALT: 17 [IU]/L (ref 0–32)
AST: 22 [IU]/L (ref 0–40)
Albumin: 4.2 g/dL (ref 3.7–4.7)
Alkaline Phosphatase: 67 [IU]/L (ref 44–121)
BUN/Creatinine Ratio: 20 (ref 12–28)
BUN: 29 mg/dL — ABNORMAL HIGH (ref 8–27)
Bilirubin Total: 0.3 mg/dL (ref 0.0–1.2)
CO2: 23 mmol/L (ref 20–29)
Calcium: 10.1 mg/dL (ref 8.7–10.3)
Chloride: 102 mmol/L (ref 96–106)
Creatinine, Ser: 1.48 mg/dL — ABNORMAL HIGH (ref 0.57–1.00)
Globulin, Total: 2.6 g/dL (ref 1.5–4.5)
Glucose: 120 mg/dL — ABNORMAL HIGH (ref 70–99)
Potassium: 4.3 mmol/L (ref 3.5–5.2)
Sodium: 140 mmol/L (ref 134–144)
Total Protein: 6.8 g/dL (ref 6.0–8.5)
eGFR: 35 mL/min/{1.73_m2} — ABNORMAL LOW (ref >59)

## 2023-12-30 NOTE — Progress Notes (Signed)
Telisa,   LdL, bad cholesterol, very close to goal.  Your TG are elevated more. I think we should add another medication to help get you TG to goal. What are your thoughts?   Kidneys are stable but still too DRY. You need to drink more water!

## 2024-01-13 DIAGNOSIS — Z961 Presence of intraocular lens: Secondary | ICD-10-CM | POA: Diagnosis not present

## 2024-01-13 DIAGNOSIS — E119 Type 2 diabetes mellitus without complications: Secondary | ICD-10-CM | POA: Diagnosis not present

## 2024-01-13 DIAGNOSIS — Z7984 Long term (current) use of oral hypoglycemic drugs: Secondary | ICD-10-CM | POA: Diagnosis not present

## 2024-01-13 DIAGNOSIS — H524 Presbyopia: Secondary | ICD-10-CM | POA: Diagnosis not present

## 2024-02-04 ENCOUNTER — Ambulatory Visit: Payer: Medicare HMO

## 2024-02-04 VITALS — Ht 63.0 in | Wt 161.0 lb

## 2024-02-04 DIAGNOSIS — Z0000 Encounter for general adult medical examination without abnormal findings: Secondary | ICD-10-CM | POA: Diagnosis not present

## 2024-02-04 NOTE — Patient Instructions (Signed)
 Ms. Nickelson , Thank you for taking time to come for your Medicare Wellness Visit. I appreciate your ongoing commitment to your health goals. Please review the following plan we discussed and let me know if I can assist you in the future.   These are the goals we discussed:  Goals       Care Coordination Activities-no follow up required      Care Coordination Interventions: Medications discussed and confirmed that patient has been in communication with clinical pharmacist. Confirmed patient has clinical pharmacist contact number. Confirmed patient has all the prescribed medications including Farxiga Discussed A1C level Discussed blood sugars Discussed importance of healthy eating and minimizing concentrated sweets and foods high in saturated fats/transfats Reviewed upcoming/scheduled appointments. Provided positive feedback and encouraged to contact Care Coordination if any care coordination questions or concerns in the future. Contact number provided.      Medication Management      Patient Goals/Self-Care Activities Over the next 30 days, patient will:  take medications as prescribed and collaborate with provider on medication access solutions  Follow Up Plan: Telephone follow up appointment with care management team member scheduled for:  1 month       Patient Stated (pt-stated)      12/08/2020 AWV Goal: Diabetes Management  Patient will maintain an A1C level below 8.0 Patient will not develop any diabetic foot complications Patient will not experience any hypoglycemic episodes over the next 3 months Patient will notify our office of any CBG readings outside of the provider recommended range by calling [phone] Patient will adhere to provider recommendations for diabetes management  Patient Self Management Activities take all medications as prescribed and report any negative side effects monitor and record blood sugar readings as directed adhere to a low carbohydrate diet  that incorporates lean proteins, vegetables, whole grains, low glycemic fruits check feet daily noting any sores, cracks, injuries, or callous formations see PCP or podiatrist if she notices any changes in her legs, feet, or toenails Patient will visit PCP and have an A1C level checked every 3 to 6 months as directed  have a yearly eye exam to monitor for vascular changes associated with diabetes and will request that the report be sent to her pcp.  consult with her PCP regarding any changes in her health or new or worsening symptoms       Patient Stated (pt-stated)      Patient would like to loose 10 lbs.      Patient Stated (pt-stated)      02/03/2023 AWV Goal: Diabetes Management  Patient will maintain an A1C level below 8.0 Patient will not develop any diabetic foot complications Patient will not experience any hypoglycemic episodes over the next 3 months Patient will notify our office of any CBG readings outside of the provider recommended range by calling [phone] Patient will adhere to provider recommendations for diabetes management  Patient Self Management Activities take all medications as prescribed and report any negative side effects monitor and record blood sugar readings as directed adhere to a low carbohydrate diet that incorporates lean proteins, vegetables, whole grains, low glycemic fruits check feet daily noting any sores, cracks, injuries, or callous formations see PCP or podiatrist if she notices any changes in her legs, feet, or toenails Patient will visit PCP and have an A1C level checked every 3 to 6 months as directed  have a yearly eye exam to monitor for vascular changes associated with diabetes and will request that the report be  sent to her pcp.  consult with her PCP regarding any changes in her health or new or worsening symptoms       Weight (lb) < 180 lb (81.6 kg)      She would like to lose weight.         This is a list of the screening  recommended for you and due dates:  Health Maintenance  Topic Date Due   COVID-19 Vaccine (3 - 2024-25 season) 07/13/2023   Eye exam for diabetics  01/08/2024   Zoster (Shingles) Vaccine (1 of 2) 03/19/2024*   DTaP/Tdap/Td vaccine (2 - Td or Tdap) 09/25/2024*   Mammogram  03/14/2024   Yearly kidney health urinalysis for diabetes  03/24/2024   Complete foot exam   03/24/2024   Hemoglobin A1C  06/27/2024   Yearly kidney function blood test for diabetes  12/28/2024   Medicare Annual Wellness Visit  02/03/2025   Pneumonia Vaccine  Completed   Flu Shot  Completed   DEXA scan (bone density measurement)  Completed   HPV Vaccine  Aged Out  *Topic was postponed. The date shown is not the original due date.

## 2024-02-04 NOTE — Progress Notes (Signed)
 Subjective:   Lindsey Fischer is a 83 y.o. female who presents for Medicare Annual (Subsequent) preventive examination.  Visit Complete: Virtual I connected with  Lindsey Fischer on 02/04/24 by a audio enabled telemedicine application and verified that I am speaking with the correct person using two identifiers.  Patient Location: Home  Provider Location: Office/Clinic  I discussed the limitations of evaluation and management by telemedicine. The patient expressed understanding and agreed to proceed.  Vital Signs: Because this visit was a virtual/telehealth visit, some criteria may be missing or patient reported. Any vitals not documented were not able to be obtained and vitals that have been documented are patient reported.  Patient Medicare AWV questionnaire was completed by the patient on n/a; I have confirmed that all information answered by patient is correct and no changes since this date.  Cardiac Risk Factors include: advanced age (>48men, >10 women);diabetes mellitus;hypertension;dyslipidemia;sedentary lifestyle     Objective:    Today's Vitals   02/04/24 1034  Weight: 161 lb (73 kg)  Height: 5\' 3"  (1.6 m)   Body mass index is 28.52 kg/m.     02/04/2024   10:49 AM 02/03/2023   11:03 AM 01/28/2022   10:42 AM 12/08/2020    2:07 PM 09/09/2017   11:06 AM  Advanced Directives  Does Patient Have a Medical Advance Directive? No Yes No Yes No  Type of Advance Directive  Living will  Healthcare Power of [place];Living will   Does patient want to make changes to medical advance directive?  No - Patient declined  No - Patient declined   Copy of Healthcare Power of Attorney in Chart?    No - copy requested   Would patient like information on creating a medical advance directive? No - Patient declined  No - Patient declined  Yes (ED - Information included in AVS)    Current Medications (verified) Outpatient Encounter Medications as of 02/04/2024  Medication Sig   albuterol  (VENTOLIN HFA) 108 (90 Base) MCG/ACT inhaler INHALE 2 PUFFS BY MOUTH ONCE DAILY AS DIRECTED   Alcohol Swabs (B-D SINGLE USE SWABS REGULAR) PADS 1 application by Does not apply route daily as needed (to use with injections).   AMBULATORY NON FORMULARY MEDICATION Nebulizer machine and tubing  Dx:  COPD exacerbation   Blood Glucose Monitoring Suppl (TRUE METRIX METER) w/Device KIT USE AS DIRECTED   ferrous sulfate 325 (65 FE) MG tablet Take 325 mg by mouth daily with breakfast.   fluticasone-salmeterol (ADVAIR) 250-50 MCG/ACT AEPB INHALE 1 PUFF INTO THE LUNGS IN THE MORNING AND AT BEDTIME.   ipratropium-albuterol (DUONEB) 0.5-2.5 (3) MG/3ML SOLN INHALE THE CONTENTS OF 1 VIAL VIA NEBULIZER EVERY 6 HOURS AS NEEDED   metFORMIN (GLUCOPHAGE) 500 MG tablet TAKE 1 TABLET TWICE DAILY WITH MEALS   Omega-3 Fatty Acids (FISH OIL) 1200 MG CAPS Take 1 capsule by mouth daily.   rosuvastatin (CRESTOR) 20 MG tablet TAKE 1 TABLET EVERY DAY (DUE FOR LABS)   TRUE METRIX BLOOD GLUCOSE TEST test strip TEST BLOOD SUGAR TWICE DAILY AS NEEDED   TRUEplus Lancets 33G MISC TEST BLOOD SUGAR TWICE DAILY AS NEEDED   valsartan-hydrochlorothiazide (DIOVAN-HCT) 160-25 MG tablet TAKE 1 TABLET EVERY DAY   VITAMIN D PO Take 1 capsule by mouth daily. Reports dose is 5000 IU   empagliflozin (JARDIANCE) 10 MG TABS tablet Take 1 tablet (10 mg total) by mouth daily. (Patient not taking: Reported on 02/04/2024)   No facility-administered encounter medications on file as of 02/04/2024.  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   Posterior vitreous detachment of both eyes 05/27/2017    Past Surgical History:  Procedure Laterality Date   APPENDECTOMY     CATARACT EXTRACTION Right    OVARIAN CYST REMOVAL     Family History  Problem Relation Age of Onset   Diabetes Mother    Heart attack Father 65       deceased   Cancer Sister        brain tumor   Cancer Brother        prostrate and lung CA   COPD Sister    Social History   Socioeconomic History   Marital status: Widowed    Spouse name: Not on file   Number of children: 3   Years of education: 71   Highest education level: 12th grade  Occupational History   Occupation: CNA    Comment: retired  Tobacco Use   Smoking status: Former    Current packs/day: 0.00    Average packs/day: 1 pack/day for 21.0 years (21.0 ttl pk-yrs)    Types: Cigarettes    Start date: 11/12/1959    Quit date: 11/11/1980    Years since quitting: 43.2   Smokeless tobacco: Never  Vaping Use   Vaping status: Never Used  Substance and Sexual Activity   Alcohol use: No   Drug use: No   Sexual activity: Not Currently  Other Topics Concern   Not on file  Social History Narrative   Her daughter lives with her. Helps a friend out with errands on Mondays and Thursdays. She has three children. She enjoys reading, playing game on her phone, church, gardening and watching television.   Social Drivers of Corporate investment banker Strain: Low Risk  (02/04/2024)   Overall Financial Resource Strain (CARDIA)    Difficulty of Paying Living Expenses: Not hard at all  Food Insecurity: No Food Insecurity (02/04/2024)   Hunger Vital Sign    Worried About Running Out of Food in the Last Year: Never true    Ran Out of Food in the Last Year: Never true  Transportation Needs: No Transportation Needs (02/04/2024)   PRAPARE - Administrator, Civil Service (Medical): No    Lack of Transportation (Non-Medical): No  Physical Activity: Inactive (02/04/2024)   Exercise Vital Sign    Days of Exercise per Week: 0 days    Minutes of Exercise  per Session: 0 min  Stress: No Stress Concern Present (02/04/2024)   [employer] of Occupational Health - Occupational Stress Questionnaire    Feeling of Stress : Not at all  Social Connections: Moderately Integrated (02/04/2024)   Social Connection and Isolation Panel [NHANES]    Frequency of Communication with Friends and Family: More than three times a week    Frequency of Social Gatherings with Friends and Family: Once a week    Attends Religious Services: More than 4 times per year    Active Member of [employer] or Organizations: Yes    Attends Banker Meetings: More than 4 times per year    Marital Status: Widowed    Tobacco Counseling Counseling  given: Not Answered   Clinical Intake:  Pre-visit preparation completed: Yes  Pain : No/denies pain     BMI - recorded: 28.52 Nutritional Status: BMI 25 -29 Overweight Nutritional Risks: None Diabetes: No  How often do you need to have someone help you when you read instructions, pamphlets, or other written materials from your doctor or pharmacy?: 1 - Never What is the last grade level you completed in school?: 12  Interpreter Needed?: No      Activities of Daily Living    02/04/2024   10:36 AM  In your present state of health, do you have any difficulty performing the following activities:  Hearing? 0  Vision? 0  Difficulty concentrating or making decisions? 0  Walking or climbing stairs? 0  Dressing or bathing? 0  Doing errands, shopping? 0  Preparing Food and eating ? N  Using the Toilet? N  In the past six months, have you accidently leaked urine? Y  Do you have problems with loss of bowel control? N  Managing your Medications? N  Managing your Finances? N  Housekeeping or managing your Housekeeping? N    Patient Care Team: Nolene Ebbs as PCP - General (Family Medicine) Raynald Blend, OD (Optometry)  Indicate any recent Medical Services you may have received from other than [facility]  providers in the past year (date may be approximate).     Assessment:   This is a routine wellness examination for Lindsey Fischer.  Hearing/Vision screen No results found.   Goals Addressed             This Visit's Progress    Weight (lb) < 180 lb (81.6 kg)   161 lb (73 kg)    She would like to lose weight.       Depression Screen    02/04/2024   10:47 AM 12/29/2023    9:47 AM 03/25/2023    9:22 AM 02/03/2023   11:04 AM 09/18/2022   11:25 AM 03/12/2022   10:54 AM 01/28/2022   10:44 AM  PHQ 2/9 Scores  PHQ - 2 Score 0 0 0 0 0 0 0    Fall Risk    02/04/2024   10:50 AM 02/03/2023   11:03 AM 12/20/2022    9:57 AM 09/18/2022   11:25 AM 03/12/2022   10:53 AM  Fall Risk   Falls in the past year? 0 0 0 0 0  Number falls in past yr: 0 0 0 0 0  Injury with Fall? 0 0 0 0 0  Risk for fall due to : No Fall Risks No Fall Risks No Fall Risks No Fall Risks No Fall Risks  Follow up Falls evaluation completed Falls evaluation completed Falls evaluation completed Falls evaluation completed Falls evaluation completed    MEDICARE RISK AT HOME: Medicare Risk at Home Any stairs in or around the home?: Yes If so, are there any without handrails?: Yes Home free of loose throw rugs in walkways, pet beds, electrical cords, etc?: Yes Adequate lighting in your home to reduce risk of falls?: Yes Life alert?: No Use of a cane, walker or w/c?: No Grab bars in the bathroom?: No Shower chair or bench in shower?: No Elevated toilet seat or a handicapped toilet?: Yes  TIMED UP AND GO:  Was the test performed?  No    Cognitive Function:        02/04/2024   10:52 AM 02/03/2023   11:15 AM 01/28/2022   10:55 AM 12/08/2020  2:18 PM  6CIT Screen  What Year? 0 points 0 points 0 points 0 points  What month? 0 points 0 points 0 points 0 points  What time? 0 points 0 points 0 points 0 points  Count back from 20 0 points 0 points 0 points 0 points  Months in reverse 0 points 0 points 0 points 0 points   Repeat phrase 2 points 2 points 2 points 2 points  Total Score 2 points 2 points 2 points 2 points    Immunizations Immunization History  Administered Date(s) Administered   Fluad Quad(high Dose 65+) 11/15/2019, 08/28/2020, 09/05/2021, 09/18/2022   Influenza Whole 09/11/2010   Influenza, High Dose Seasonal PF 06/25/2023   Influenza,inj,Quad PF,6+ Mos 07/16/2013, 07/24/2015, 08/18/2017, 08/03/2018   Influenza-Unspecified 07/31/2012, 08/04/2014, 08/25/2016   PFIZER(Purple Top)SARS-COV-2 Vaccination 03/20/2020, 04/10/2020   Pneumococcal Conjugate-13 01/20/2015   Pneumococcal Polysaccharide-23 09/11/2010   Tdap 03/05/2012    TDAP status: Due, Education has been provided regarding the importance of this vaccine. Advised may receive this vaccine at local pharmacy or Health Dept. Aware to provide a copy of the vaccination record if obtained from local pharmacy or Health Dept. Verbalized acceptance and understanding.  Flu Vaccine status: Up to date  Pneumococcal vaccine status: Up to date  Covid-19 vaccine status: Information provided on how to obtain vaccines.   Qualifies for Shingles Vaccine? Yes   Zostavax completed No   Shingrix Completed?: No.    Education has been provided regarding the importance of this vaccine. Patient has been advised to call insurance company to determine out of pocket expense if they have not yet received this vaccine. Advised may also receive vaccine at local pharmacy or Health Dept. Verbalized acceptance and understanding.  Screening Tests Health Maintenance  Topic Date Due   COVID-19 Vaccine (3 - 2024-25 season) 07/13/2023   OPHTHALMOLOGY EXAM  01/08/2024   Zoster Vaccines- Shingrix (1 of 2) 03/19/2024 (Originally 08/15/1991)   DTaP/Tdap/Td (2 - Td or Tdap) 09/25/2024 (Originally 03/05/2022)   MAMMOGRAM  03/14/2024   Diabetic kidney evaluation - Urine ACR  03/24/2024   FOOT EXAM  03/24/2024   HEMOGLOBIN A1C  06/27/2024   Diabetic kidney evaluation -  eGFR measurement  12/28/2024   Medicare Annual Wellness (AWV)  02/03/2025   Pneumonia Vaccine 45+ Years old  Completed   INFLUENZA VACCINE  Completed   DEXA SCAN  Completed   HPV VACCINES  Aged Out    Health Maintenance  Health Maintenance Due  Topic Date Due   COVID-19 Vaccine (3 - 2024-25 season) 07/13/2023   OPHTHALMOLOGY EXAM  01/08/2024    Colorectal cancer screening: No longer required.   Mammogram status: Ordered 02/04/2024. Pt provided with contact info and advised to call to schedule appt.   Bone Density status: Completed 11/17/2019. Results reflect: Bone density results: NORMAL. Repeat every 0 years.  Lung Cancer Screening: (Low Dose CT Chest recommended if Age 33-80 years, 20 pack-year currently smoking OR have quit w/in 15years.) does not qualify.   Lung Cancer Screening Referral: n/a  Additional Screening:  Hepatitis C Screening: does not qualify; Completed   Vision Screening: Recommended annual ophthalmology exams for early detection of glaucoma and other disorders of the eye. Is the patient up to date with their annual eye exam?  Yes  Who is the provider or what is the name of the office in which the patient attends annual eye exams? Dr Neale Burly If pt is not established with a provider, would they like to be referred to  a provider to establish care?  N/a .   Dental Screening: Recommended annual dental exams for proper oral hygiene  Diabetic Foot Exam: Diabetic Foot Exam: Completed 03/25/2023  Community Resource Referral / Chronic Care Management: CRR required this visit?  No   CCM required this visit?  No     Plan:     I have personally reviewed and noted the following in the patient's chart:   Medical and social history Use of alcohol, tobacco or illicit drugs  Current medications and supplements including opioid prescriptions. Patient is not currently taking opioid prescriptions. Functional ability and status Nutritional status Physical  activity Advanced directives List of other physicians Hospitalizations, surgeries, and ER visits in previous 12 months No ER/hospitalizations or surgeries  Vitals Screenings to include cognitive, depression, and falls Referrals and appointments  In addition, I have reviewed and discussed with patient certain preventive protocols, quality metrics, and best practice recommendations. A written personalized care plan for preventive services as well as general preventive health recommendations were provided to patient.     Esmond Harps, CMA   02/04/2024   After Visit Summary: (MyChart) Due to this being a telephonic visit, the after visit summary with patients personalized plan was offered to patient via MyChart   Nurse Notes:    Lindsey Fischer is a 83 y.o. female patient of [name], PA-C who had a Medicare Annual Wellness Visit today via telephone. Lindsey Fischer is Retired and lives with her daughter. She has 3 children. She reports that she is socially active and does interact with friends/family regularly. She is minimally physically active and enjoys reading, playing game on her phone, church, gardening and watching television.

## 2024-03-15 ENCOUNTER — Other Ambulatory Visit: Payer: Self-pay | Admitting: Physician Assistant

## 2024-03-29 ENCOUNTER — Encounter: Payer: Self-pay | Admitting: Physician Assistant

## 2024-03-29 ENCOUNTER — Ambulatory Visit: Payer: Medicare HMO | Admitting: Physician Assistant

## 2024-03-29 VITALS — BP 132/56 | HR 56 | Ht 63.0 in | Wt 159.0 lb

## 2024-03-29 DIAGNOSIS — R42 Dizziness and giddiness: Secondary | ICD-10-CM | POA: Diagnosis not present

## 2024-03-29 DIAGNOSIS — Z7984 Long term (current) use of oral hypoglycemic drugs: Secondary | ICD-10-CM

## 2024-03-29 DIAGNOSIS — E1122 Type 2 diabetes mellitus with diabetic chronic kidney disease: Secondary | ICD-10-CM

## 2024-03-29 DIAGNOSIS — N1831 Chronic kidney disease, stage 3a: Secondary | ICD-10-CM

## 2024-03-29 DIAGNOSIS — J301 Allergic rhinitis due to pollen: Secondary | ICD-10-CM | POA: Diagnosis not present

## 2024-03-29 DIAGNOSIS — N183 Chronic kidney disease, stage 3 unspecified: Secondary | ICD-10-CM

## 2024-03-29 DIAGNOSIS — I10 Essential (primary) hypertension: Secondary | ICD-10-CM

## 2024-03-29 DIAGNOSIS — R809 Proteinuria, unspecified: Secondary | ICD-10-CM

## 2024-03-29 DIAGNOSIS — N1832 Chronic kidney disease, stage 3b: Secondary | ICD-10-CM | POA: Diagnosis not present

## 2024-03-29 LAB — POCT GLYCOSYLATED HEMOGLOBIN (HGB A1C): Hemoglobin A1C: 6.3 % — AB (ref 4.0–5.6)

## 2024-03-29 LAB — POCT UA - MICROALBUMIN
Albumin/Creatinine Ratio, Urine, POC: >300
Creatinine, POC: 100 mg/dL
Microalbumin Ur, POC: 150 mg/L

## 2024-03-29 MED ORDER — EMPAGLIFLOZIN 10 MG PO TABS: 10.0000 mg | ORAL_TABLET | Freq: Every day | ORAL | 3 refills | Status: AC

## 2024-03-29 MED ORDER — METFORMIN HCL 500 MG PO TABS: 500.0000 mg | ORAL_TABLET | Freq: Two times a day (BID) | ORAL | 3 refills | Status: AC

## 2024-03-29 MED ORDER — FLUTICASONE PROPIONATE 50 MCG/ACT NA SUSP
2.0000 | Freq: Every day | NASAL | 0 refills | Status: DC
Start: 2024-03-29 — End: 2024-05-03

## 2024-03-29 NOTE — Progress Notes (Signed)
 Established Patient Office Visit  Subjective   Patient ID: Lindsey Fischer, female    DOB: 19-May-1941  Age: 83 y.o. MRN: [id number]  Chief Complaint  Patient presents with   Medical Management of Chronic Issues    Last A1c 6.4    HPI Pt is a 83 yo female with HTN, T2DM, HLD, CKD 3b, Asthma who presents to the clinic with her daughter for follow up.   Pt is doing well. She is not checking her sugars. She is taking metformin  daily. She was taking jardiance  but ran out for last 2 months. She denies any CP, palpitations, headaches or vision changes. She did feel lightheaded Saturday but resolved by Sunday. She does not check her BP at home.   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   .. 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   Posterior vitreous detachment of both eyes 05/27/2017   CKD (chronic kidney disease) stage 3, GFR 30-59 ml/min (HCC) 06/03/2017   Neck stiffness 08/20/2017   Numbness 08/20/2017   Chronic neck pain 01/16/2018   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   Cervical radiculopathy 01/29/2018   Ingrown toenail of left foot 07/31/2018   Nausea 07/02/2019   BPPV (benign paroxysmal positional vertigo), right 02/18/2020   Hypertriglyceridemia 02/18/2020   Seborrheic dermatitis 05/22/2020   Atopic dermatitis 08/28/2020   Rib pain on left side 12/08/2020    Age-related cataract of left eye 03/06/2021   Essential hypertension with goal blood pressure less than 130/80 06/11/2021   Left lower quadrant abdominal pain 12/26/2021   Diverticulitis 12/28/2021   Microalbuminuria 06/18/2022   Lightheadedness 09/29/2023   Seasonal allergic rhinitis due to pollen 03/29/2024   Resolved Ambulatory Problems    Diagnosis Date Noted   Dyslipidemia 01/19/2010   GRIEF REACTION, ACUTE 03/27/2010   Impaired fasting glucose 03/27/2010   Hypoxemia 05/22/2010   Pneumonia 05/06/2011   Metabolic syndrome X 03/05/2012   Epigastric abdominal tenderness with rebound tenderness 12/19/2015   Non-intractable cyclical vomiting with nausea 12/19/2015   Abdominal guarding 12/19/2015   Diarrhea 12/19/2015   Helicobacter pylori gastritis 12/20/2015   Atypical chest pain 10/22/2016   Neck muscle spasm 08/20/2017   Other headache syndrome 08/20/2017   Pneumonia of both lower lobes due to infectious organism 07/06/2019   Past Medical History:  Diagnosis Date   Asthma    Diabetes mellitus    Hyperlipidemia    Hypertension        Review of Systems  All other systems reviewed and are negative.     Objective:     BP (!) 132/56   Pulse (!) 56   Ht 5\' 3"  (1.6 m)   Wt 159 lb (72.1 kg)   SpO2 100%   BMI 28.17 kg/m  BP Readings from Last 3 Encounters:  03/29/24 (!) 132/56  12/29/23 (!) 142/76  10/14/23 (!) 151/56   Wt Readings from Last  3 Encounters:  03/29/24 159 lb (72.1 kg)  02/04/24 161 lb (73 kg)  12/29/23 161 lb (73 kg)    .Aaron Aas Results for orders placed or performed in visit on 03/29/24  POCT HgB A1C   Collection Time: 03/29/24  9:43 AM  Result Value Ref Range   Hemoglobin A1C 6.3 (A) 4.0 - 5.6 %   HbA1c POC (<> result, manual entry)     HbA1c, POC (prediabetic range)     HbA1c, POC (controlled diabetic range)    POCT UA - Microalbumin   Collection Time: 03/29/24 11:25 AM  Result Value Ref Range   Microalbumin Ur, POC 150 mg/L    Creatinine, POC 100 mg/dL   Albumin/Creatinine Ratio, Urine, POC >300      Physical Exam Constitutional:      Appearance: Normal appearance.  HENT:     Head: Normocephalic.  Cardiovascular:     Rate and Rhythm: Normal rate and regular rhythm.  Pulmonary:     Effort: Pulmonary effort is normal.     Breath sounds: Normal breath sounds.  Musculoskeletal:     Right lower leg: No edema.     Left lower leg: No edema.  Neurological:     General: No focal deficit present.     Mental Status: She is alert and oriented to person, place, and time.  Psychiatric:        Mood and Affect: Mood normal.        Assessment & Plan:  Aaron AasAaron AasAlegra was seen today for medical management of chronic issues.  Diagnoses and all orders for this visit:  Type 2 diabetes mellitus with stage 3b chronic kidney disease, without long-term current use of insulin (HCC) -     POCT HgB A1C -     POCT UA - Microalbumin -     metFORMIN  (GLUCOPHAGE ) 500 MG tablet; Take 1 tablet (500 mg total) by mouth 2 (two) times daily with a meal. -     empagliflozin  (JARDIANCE ) 10 MG TABS tablet; Take 1 tablet (10 mg total) by mouth daily.  Type 2 diabetes mellitus with stage 3a chronic kidney disease, without long-term current use of insulin (HCC)  ESSENTIAL HYPERTENSION, BENIGN -     empagliflozin  (JARDIANCE ) 10 MG TABS tablet; Take 1 tablet (10 mg total) by mouth daily.  Microalbuminuria -     empagliflozin  (JARDIANCE ) 10 MG TABS tablet; Take 1 tablet (10 mg total) by mouth daily.  Stage 3 chronic kidney disease, unspecified whether stage 3a or 3b CKD (HCC) -     empagliflozin  (JARDIANCE ) 10 MG TABS tablet; Take 1 tablet (10 mg total) by mouth daily.  Seasonal allergic rhinitis due to pollen -     fluticasone  (FLONASE ) 50 MCG/ACT nasal spray; Place 2 sprays into both nostrils daily.  Lightheadedness   A1C to goal Continue on metformin  Pt has not had her jardiance  for last 2 months Will resend to pharmacy for glucose  control and kidney protection BP close to goal, on ACE Microalbumin, on ACE and restarted SGLT-2 On statin Foot and eye exam UTD Vaccine UTD Follow up in 3 months  Discussed lightheadness and many reasons could have effected how she felt. Make sure staying hydrated and check BP if feeling this way. Follow up as needed.    Return in about 3 months (around 06/29/2024).    Becki Mccaskill, PA-C

## 2024-04-30 ENCOUNTER — Other Ambulatory Visit: Payer: Self-pay | Admitting: Physician Assistant

## 2024-04-30 DIAGNOSIS — J301 Allergic rhinitis due to pollen: Secondary | ICD-10-CM

## 2024-06-29 ENCOUNTER — Ambulatory Visit (INDEPENDENT_AMBULATORY_CARE_PROVIDER_SITE_OTHER): Admitting: Physician Assistant

## 2024-06-29 ENCOUNTER — Encounter: Payer: Self-pay | Admitting: Physician Assistant

## 2024-06-29 VITALS — BP 131/66 | HR 76 | Ht 63.0 in | Wt 157.0 lb

## 2024-06-29 DIAGNOSIS — N1832 Chronic kidney disease, stage 3b: Secondary | ICD-10-CM

## 2024-06-29 DIAGNOSIS — Z7984 Long term (current) use of oral hypoglycemic drugs: Secondary | ICD-10-CM

## 2024-06-29 DIAGNOSIS — J4520 Mild intermittent asthma, uncomplicated: Secondary | ICD-10-CM | POA: Diagnosis not present

## 2024-06-29 DIAGNOSIS — I10 Essential (primary) hypertension: Secondary | ICD-10-CM

## 2024-06-29 DIAGNOSIS — E1122 Type 2 diabetes mellitus with diabetic chronic kidney disease: Secondary | ICD-10-CM | POA: Diagnosis not present

## 2024-06-29 DIAGNOSIS — R809 Proteinuria, unspecified: Secondary | ICD-10-CM | POA: Diagnosis not present

## 2024-06-29 DIAGNOSIS — E785 Hyperlipidemia, unspecified: Secondary | ICD-10-CM

## 2024-06-29 DIAGNOSIS — D508 Other iron deficiency anemias: Secondary | ICD-10-CM

## 2024-06-29 LAB — POCT GLYCOSYLATED HEMOGLOBIN (HGB A1C): Hemoglobin A1C: 6.4 % — AB (ref 4.0–5.6)

## 2024-06-29 MED ORDER — FLUTICASONE-SALMETEROL 250-50 MCG/ACT IN AEPB: 1.0000 | INHALATION_SPRAY | Freq: Two times a day (BID) | RESPIRATORY_TRACT | 3 refills | Status: AC

## 2024-06-29 NOTE — Patient Instructions (Signed)
 Consider shingles and Tdap vaccine from pharmacy.

## 2024-06-29 NOTE — Progress Notes (Signed)
 Established Patient Office Visit  Subjective   Patient ID: Lindsey Fischer, female    DOB: 11-11-1941  Age: 83 y.o. MRN: [id number]  Chief Complaint  Patient presents with   Medical Management of Chronic Issues    HPI Pt is a 83 yo female with T2DM, CKD 3b, HTN, HLD, ASthma who presents to the clinic for follow up and medication refills.   Pt does not check sugars. She is compliant with all medications. She is down 5lbs from last visit.  She takes jardiance  and metformin . She denies any hypoglycemic events. 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 She denies any CP, palpitations, headaches or vision changes. She takes her BP medication, diovan /hct daily.   She has been breathing well. No recent exacerbations. She uses advair daily. She rarely uses albuterol .    Review of Systems  All other systems reviewed and are negative.     Objective:     BP 131/66   Pulse 76   Ht 5' 3 (1.6 m)   Wt 157 lb (71.2 kg)   SpO2 99%   BMI 27.81 kg/m  BP Readings from Last 3 Encounters:  06/29/24 131/66  03/29/24 (!) 132/56  12/29/23 (!) 142/76   Wt Readings from Last 3 Encounters:  06/29/24 157 lb (71.2 kg)  03/29/24 159 lb (72.1 kg)  02/04/24 161 lb (73 kg)      Physical Exam Constitutional:      Appearance: Normal appearance.  HENT:     Head: Normocephalic.  Neck:     Vascular: No carotid bruit.  Cardiovascular:     Rate and Rhythm: Normal rate and regular rhythm.  Pulmonary:     Effort: Pulmonary effort is normal.     Breath sounds: Normal breath sounds.  Musculoskeletal:     Right lower leg: No edema.     Left lower leg: No edema.  Neurological:     General: No focal deficit present.     Mental Status: She is alert and oriented to person, place, and time.  Psychiatric:        Mood and Affect: Mood normal.      Results for orders placed or performed in visit on 06/29/24  POCT HgB A1C  Result Value Ref Range   Hemoglobin A1C 6.4 (A)  4.0 - 5.6 %   HbA1c POC (<> result, manual entry)     HbA1c, POC (prediabetic range)     HbA1c, POC (controlled diabetic range)        Assessment & Plan:  Lindsey Fischer was seen today for medical management of chronic issues.  Diagnoses and all orders for this visit:  Type 2 diabetes mellitus with stage 3b chronic kidney disease, without long-term current use of insulin (HCC) -     POCT HgB A1C -     CMP14+EGFR  ESSENTIAL HYPERTENSION, BENIGN -     CMP14+EGFR  Iron deficiency anemia secondary to inadequate dietary iron intake -     CBC w/Diff/Platelet -     Fe+TIBC+Fer  Mild intermittent asthma without complication -     fluticasone -salmeterol (ADVAIR) 250-50 MCG/ACT AEPB; Inhale 1 puff into the lungs in the morning and at bedtime.  Microalbuminuria  Dyslipidemia, goal LDL below 70   A1C to goal, no change from 3 months ago Continue same medications metformin  and jardiance  BP very close to goal, continue to work on lower salt in diet with low sugar and carbs On statin, LDL to  goal Eye exam UTD Foot exam UTD Microalbumin abnormal at last visit, on SGLT-2 and ARB Cmp ordered Declined covid booster Will get flu shot in the fall Reminded to get shingles and tdap at local pharmacy Follow up in 3 months  Advair refilled Use albuterol  as needed  Labs ordered to recheck kidney function and IDA  Wants to hold off on mammogram screening right now   Vermell Bologna, PA-C

## 2024-06-30 ENCOUNTER — Ambulatory Visit: Payer: Self-pay | Admitting: Physician Assistant

## 2024-06-30 LAB — CBC WITH DIFFERENTIAL/PLATELET
Basophils Absolute: 0.1 x10E3/uL (ref 0.0–0.2)
Basos: 1 %
EOS (ABSOLUTE): 0.1 x10E3/uL (ref 0.0–0.4)
Eos: 2 %
Hematocrit: 33.8 % — ABNORMAL LOW (ref 34.0–46.6)
Hemoglobin: 11.3 g/dL (ref 11.1–15.9)
Immature Grans (Abs): 0 x10E3/uL (ref 0.0–0.1)
Immature Granulocytes: 0 %
Lymphocytes Absolute: 2.5 x10E3/uL (ref 0.7–3.1)
Lymphs: 58 %
MCH: 28.8 pg (ref 26.6–33.0)
MCHC: 33.4 g/dL (ref 31.5–35.7)
MCV: 86 fL (ref 79–97)
Monocytes Absolute: 0.5 x10E3/uL (ref 0.1–0.9)
Monocytes: 12 %
Neutrophils Absolute: 1.1 x10E3/uL — ABNORMAL LOW (ref 1.4–7.0)
Neutrophils: 26 %
Platelets: 241 x10E3/uL (ref 150–450)
RBC: 3.92 x10E6/uL (ref 3.77–5.28)
RDW: 13.2 % (ref 11.7–15.4)
WBC: 4.3 x10E3/uL (ref 3.4–10.8)

## 2024-06-30 LAB — CMP14+EGFR
ALT: 13 IU/L (ref 0–32)
AST: 20 IU/L (ref 0–40)
Albumin: 4.3 g/dL (ref 3.7–4.7)
Alkaline Phosphatase: 61 IU/L (ref 44–121)
BUN/Creatinine Ratio: 20 (ref 12–28)
BUN: 29 mg/dL — ABNORMAL HIGH (ref 8–27)
Bilirubin Total: 0.4 mg/dL (ref 0.0–1.2)
CO2: 20 mmol/L (ref 20–29)
Calcium: 9.8 mg/dL (ref 8.7–10.3)
Chloride: 101 mmol/L (ref 96–106)
Creatinine, Ser: 1.45 mg/dL — ABNORMAL HIGH (ref 0.57–1.00)
Globulin, Total: 2.3 g/dL (ref 1.5–4.5)
Glucose: 101 mg/dL — ABNORMAL HIGH (ref 70–99)
Potassium: 3.9 mmol/L (ref 3.5–5.2)
Sodium: 136 mmol/L (ref 134–144)
Total Protein: 6.6 g/dL (ref 6.0–8.5)
eGFR: 36 mL/min/1.73 — ABNORMAL LOW (ref >59)

## 2024-06-30 LAB — IRON,TIBC AND FERRITIN PANEL
Ferritin: 48 ng/mL (ref 15–150)
Iron Saturation: 30 % (ref 15–55)
Iron: 102 ug/dL (ref 27–139)
Total Iron Binding Capacity: 343 ug/dL (ref 250–450)
UIBC: 241 ug/dL (ref 118–369)

## 2024-06-30 NOTE — Progress Notes (Signed)
 Labs are stable.  Your kidneys continue to look on the dry side. Drink MORE water daily.

## 2024-07-22 ENCOUNTER — Telehealth: Payer: Self-pay

## 2024-07-22 NOTE — Telephone Encounter (Signed)
 Will patient need a visit to evaluate?

## 2024-07-22 NOTE — Telephone Encounter (Signed)
 Copied from CRM #[id number]. Topic: Clinical - Medication Question >> Jul 22, 2024 11:29 AM Cherylann RAMAN wrote: Reason for CRM: Patient is requesting a prescription for her flare up of diverticulitis. She is requesting that the medication to be sent to [id number] [address], [place] - [address] [place] [address] Phone: [phone] Fax: [phone] Hours: Not open 24 hours  Please contact patient at [phone] for additional information.

## 2024-07-23 ENCOUNTER — Ambulatory Visit (INDEPENDENT_AMBULATORY_CARE_PROVIDER_SITE_OTHER): Admitting: Physician Assistant

## 2024-07-23 ENCOUNTER — Encounter: Payer: Self-pay | Admitting: Physician Assistant

## 2024-07-23 VITALS — BP 123/57 | HR 74 | Temp 98.0°F | Ht 63.0 in | Wt 157.0 lb

## 2024-07-23 DIAGNOSIS — R1030 Lower abdominal pain, unspecified: Secondary | ICD-10-CM | POA: Diagnosis not present

## 2024-07-23 DIAGNOSIS — R198 Other specified symptoms and signs involving the digestive system and abdomen: Secondary | ICD-10-CM | POA: Diagnosis not present

## 2024-07-23 DIAGNOSIS — Z8719 Personal history of other diseases of the digestive system: Secondary | ICD-10-CM

## 2024-07-23 LAB — POCT URINALYSIS DIP (CLINITEK)
Bilirubin, UA: NEGATIVE
Blood, UA: NEGATIVE
Glucose, UA: NEGATIVE mg/dL
Ketones, POC UA: NEGATIVE mg/dL
Leukocytes, UA: NEGATIVE
Nitrite, UA: NEGATIVE
POC PROTEIN,UA: NEGATIVE
Spec Grav, UA: 1.025 (ref 1.010–1.025)
Urobilinogen, UA: 0.2 U/dL
pH, UA: 5.5 (ref 5.0–8.0)

## 2024-07-23 MED ORDER — METRONIDAZOLE 500 MG PO TABS: 500.0000 mg | ORAL_TABLET | Freq: Three times a day (TID) | ORAL | 0 refills | Status: AC

## 2024-07-23 MED ORDER — CIPROFLOXACIN HCL 500 MG PO TABS: 500.0000 mg | ORAL_TABLET | Freq: Two times a day (BID) | ORAL | 0 refills | Status: AC

## 2024-07-23 NOTE — Telephone Encounter (Signed)
 Per Vermell Bologna, PA-  patient can come to office now to be seen- as she could not make the 3pm offered.  Patient informed and will head to the office now.

## 2024-07-23 NOTE — Patient Instructions (Signed)
Diverticulitis  Diverticulitis happens when poop (stool) and bacteria get trapped in small pouches in the colon called diverticula. These pouches may form if you have a condition called diverticulosis. When the poop and bacteria get trapped, it can cause an infection and inflammation. Diverticulitis may cause severe stomach pain and diarrhea. It can also lead to tissue damage in your colon. This can cause bleeding or blockage. In some cases, the diverticula may burst (rupture). This can cause infected poop to go into other parts of your abdomen. What are the causes? This condition is caused by poop getting trapped in the diverticula. This allows bacteria to grow. It can lead to inflammation and infection. What increases the risk? You are more likely to get this condition if you have diverticulosis. You are also more at risk if: You are overweight or obese. You do not get enough exercise. You drink alcohol. You smoke. You eat a lot of red meat, such as beef, pork, or lamb. You do not get enough fiber. Foods high in fiber include fruits, vegetables, beans, nuts, and whole grains. You are over 35 years of age. What are the signs or symptoms? Symptoms of this condition may include: Pain and tenderness in the abdomen. This pain is often felt on the left side but may occur in other spots. Fever and chills. Nausea and vomiting. Cramping. Bloating. Changes in how often you poop. Blood in your poop. How is this diagnosed? This condition is diagnosed based on your medical history and a physical exam. You may also have tests done to make sure there is nothing else causing your condition. These tests may include: Blood tests. Tests done on your pee (urine). A CT scan of the abdomen. You may need to have a colonoscopy. This is an exam to look at your whole large intestine. During the exam, a tube is put into the opening of your butt (anus) and then moved into your rectum, colon, and other parts of  the large intestine. This exam is done to look at the diverticula. It can also see if there is something else that may be causing your symptoms. How is this treated? Most cases are mild and can be treated at home. You may be told to: Take over-the-counter pain medicine. Only eat and drink clear liquids. Take antibiotics. Rest. More severe cases may need to be treated at a hospital. Treatment may include: Not eating or drinking. Taking pain medicines. Getting antibiotics through an IV. Getting fluids and nutrition through an IV. Surgery. Follow these instructions at home: Medicines Take over-the-counter and prescription medicines only as told by your health care provider. These include fiber supplements, probiotics, and medicines to soften your poop (stool softeners). If you were prescribed antibiotics, take them as told by your provider. Do not stop using the antibiotic even if you start to feel better. Ask your provider if the medicine prescribed to you requires you to avoid driving or using machinery. Eating and drinking  Follow the diet told by your provider. You may need to only eat and drink liquids. After your symptoms get better, you may be able to return to a more normal diet. You may be told to eat at least 25 grams (25 g) of fiber each day. Fiber makes it easier to poop. Healthy sources of fiber include: Berries. One cup has 4-8 g of fiber. Beans or lentils. One-half cup has 5-8 g of fiber. Green vegetables. One cup has 4 g of fiber. Avoid eating red meat.  General instructions Do not use any products that contain nicotine or tobacco. These products include cigarettes, chewing tobacco, and vaping devices, such as e-cigarettes. If you need help quitting, ask your provider. Exercise for at least 30 minutes, 3 times a week. Exercise hard enough to raise your heart rate and break a sweat. Contact a health care provider if: Your pain gets worse. Your pooping does not go back to  normal. Your symptoms do not get better with treatment. Your symptoms get worse all of a sudden. You have a fever. You vomit more than one time. Your poop is bloody, black, or tarry. This information is not intended to replace advice given to you by your health care provider. Make sure you discuss any questions you have with your health care provider. Document Revised: 07/25/2022 Document Reviewed: 07/25/2022 Elsevier Patient Education  2024 [employer].

## 2024-07-23 NOTE — Progress Notes (Signed)
 Acute Office Visit  Subjective:     Patient ID: Lindsey Fischer, female    DOB: 11-Nov-1941, 83 y.o.   MRN: [id number]  Chief Complaint  Patient presents with   Medical Management of Chronic Issues    HPI Patient is in today for lower abdominal pain over the past 2 days that is worsening. She is accompanied by her daughter. She has hx of diverticulitis and has been treated for this about once a year for a few years. Last CT of abdomen was done 12/2021. She admits to eating popcorn that sometimes causes this. She denies any fever, chills, body aches, nausea, vomiting, diarrhea, or constipation. She has not taken anything to try to make better. She denies any urinary symptoms.   ROS See HPI>      Objective:    BP (!) 123/57   Pulse 74   Temp 98 F (36.7 C)   Ht 5' 3 (1.6 m)   Wt 157 lb (71.2 kg)   SpO2 99%   BMI 27.81 kg/m  BP Readings from Last 3 Encounters:  07/23/24 (!) 123/57  06/29/24 131/66  03/29/24 (!) 132/56   Wt Readings from Last 3 Encounters:  07/23/24 157 lb (71.2 kg)  06/29/24 157 lb (71.2 kg)  03/29/24 159 lb (72.1 kg)    .SABRA Results for orders placed or performed in visit on 07/23/24  Urine Culture   Collection Time: 07/23/24  2:39 PM   Specimen: Urine   Urine  Result Value Ref Range   Urine Culture, Routine Final report    Organism ID, Bacteria Comment   CBC w/Diff/Platelet   Collection Time: 07/23/24  3:00 PM  Result Value Ref Range   WBC 5.6 3.4 - 10.8 x10E3/uL   RBC 4.12 3.77 - 5.28 x10E6/uL   Hemoglobin 11.8 11.1 - 15.9 g/dL   Hematocrit 64.3 65.9 - 46.6 %   MCV 86 79 - 97 fL   MCH 28.6 26.6 - 33.0 pg   MCHC 33.1 31.5 - 35.7 g/dL   RDW 86.3 88.2 - 84.5 %   Platelets 266 150 - 450 x10E3/uL   Neutrophils 37 Not Estab. %   Lymphs 44 Not Estab. %   Monocytes 12 Not Estab. %   Eos 5 Not Estab. %   Basos 1 Not Estab. %   Neutrophils Absolute 2.1 1.4 - 7.0 x10E3/uL   Lymphocytes Absolute 2.5 0.7 - 3.1 x10E3/uL   Monocytes Absolute 0.7 0.1  - 0.9 x10E3/uL   EOS (ABSOLUTE) 0.3 0.0 - 0.4 x10E3/uL   Basophils Absolute 0.1 0.0 - 0.2 x10E3/uL   Immature Granulocytes 0 Not Estab. %   Immature Grans (Abs) 0.0 0.0 - 0.1 x10E3/uL  CMP14+EGFR   Collection Time: 07/23/24  3:00 PM  Result Value Ref Range   Glucose 99 70 - 99 mg/dL   BUN 30 (H) 8 - 27 mg/dL   Creatinine, Ser 8.46 (H) 0.57 - 1.00 mg/dL   eGFR 34 (L) >40 fO/fpw/8.26   BUN/Creatinine Ratio 20 12 - 28   Sodium 141 134 - 144 mmol/L   Potassium 4.0 3.5 - 5.2 mmol/L   Chloride 101 96 - 106 mmol/L   CO2 23 20 - 29 mmol/L   Calcium  10.0 8.7 - 10.3 mg/dL   Total Protein 6.8 6.0 - 8.5 g/dL   Albumin 4.2 3.7 - 4.7 g/dL   Globulin, Total 2.6 1.5 - 4.5 g/dL   Bilirubin Total 0.3 0.0 - 1.2 mg/dL   Alkaline Phosphatase 67  44 - 121 IU/L   AST 19 0 - 40 IU/L   ALT 12 0 - 32 IU/L  Lipase   Collection Time: 07/23/24  3:00 PM  Result Value Ref Range   Lipase 45 14 - 85 U/L  POCT URINALYSIS DIP (CLINITEK)   Collection Time: 07/23/24  3:51 PM  Result Value Ref Range   Color, UA yellow yellow   Clarity, UA clear clear   Glucose, UA negative negative mg/dL   Bilirubin, UA negative negative   Ketones, POC UA negative negative mg/dL   Spec Grav, UA 8.974 8.989 - 1.025   Blood, UA negative negative   pH, UA 5.5 5.0 - 8.0   POC PROTEIN,UA negative negative, trace   Urobilinogen, UA 0.2 0.2 or 1.0 E.U./dL   Nitrite, UA Negative Negative   Leukocytes, UA Negative Negative     Physical Exam Constitutional:      Appearance: Normal appearance. She is not ill-appearing.  HENT:     Head: Normocephalic.  Pulmonary:     Effort: Pulmonary effort is normal.     Breath sounds: Normal breath sounds.  Abdominal:     General: Bowel sounds are normal. There is distension.     Tenderness: There is abdominal tenderness. There is guarding. There is no right CVA tenderness, left CVA tenderness or rebound.     Hernia: A hernia is present.     Comments: Large umbilical hernia, unchanged.   Bilateral lower abdominal tenderness and guarding. No rebound.   Neurological:     General: No focal deficit present.     Mental Status: She is alert and oriented to person, place, and time.        Assessment & Plan:  SABRASABRADeanndra was seen today for medical management of chronic issues.  Diagnoses and all orders for this visit:  Lower abdominal pain -     CBC w/Diff/Platelet -     CMP14+EGFR -     Lipase -     POCT URINALYSIS DIP (CLINITEK) -     Urine Culture -     ciprofloxacin  (CIPRO ) 500 MG tablet; Take 1 tablet (500 mg total) by mouth 2 (two) times daily for 10 days. -     metroNIDAZOLE  (FLAGYL ) 500 MG tablet; Take 1 tablet (500 mg total) by mouth 3 (three) times daily for 10 days.  Lower abdominal guarding -     CBC w/Diff/Platelet -     CMP14+EGFR -     Lipase -     ciprofloxacin  (CIPRO ) 500 MG tablet; Take 1 tablet (500 mg total) by mouth 2 (two) times daily for 10 days. -     metroNIDAZOLE  (FLAGYL ) 500 MG tablet; Take 1 tablet (500 mg total) by mouth 3 (three) times daily for 10 days.  History of colonic diverticulitis -     ciprofloxacin  (CIPRO ) 500 MG tablet; Take 1 tablet (500 mg total) by mouth 2 (two) times daily for 10 days. -     metroNIDAZOLE  (FLAGYL ) 500 MG tablet; Take 1 tablet (500 mg total) by mouth 3 (three) times daily for 10 days.  Due to patients history and symptoms most likely diverticulitis; however, she is having right sided tenderness and guarding as well. She does not know if ever had her appendix removed. Appendix was not visualized on last CT scan. Less likely appendicitis.  UA negative for blood,leuks, protein, nitrites and with no urinary symptoms less like UTI. Will culture.  Will get CBC/Lipase/CMP to look for any infection, pancreatic  inflammation, organ injury Keep hydrated Start cipro  and metronidazole  for diverticulitis, if no improvement or symptoms worsening go to ED.   Fleur Audino, PA-C

## 2024-07-23 NOTE — Telephone Encounter (Signed)
 We need to document can you add her at 3pm.

## 2024-07-24 LAB — CBC WITH DIFFERENTIAL/PLATELET
Basophils Absolute: 0.1 x10E3/uL (ref 0.0–0.2)
Basos: 1 %
EOS (ABSOLUTE): 0.3 x10E3/uL (ref 0.0–0.4)
Eos: 5 %
Hematocrit: 35.6 % (ref 34.0–46.6)
Hemoglobin: 11.8 g/dL (ref 11.1–15.9)
Immature Grans (Abs): 0 x10E3/uL (ref 0.0–0.1)
Immature Granulocytes: 0 %
Lymphocytes Absolute: 2.5 x10E3/uL (ref 0.7–3.1)
Lymphs: 44 %
MCH: 28.6 pg (ref 26.6–33.0)
MCHC: 33.1 g/dL (ref 31.5–35.7)
MCV: 86 fL (ref 79–97)
Monocytes Absolute: 0.7 x10E3/uL (ref 0.1–0.9)
Monocytes: 12 %
Neutrophils Absolute: 2.1 x10E3/uL (ref 1.4–7.0)
Neutrophils: 37 %
Platelets: 266 x10E3/uL (ref 150–450)
RBC: 4.12 x10E6/uL (ref 3.77–5.28)
RDW: 13.6 % (ref 11.7–15.4)
WBC: 5.6 x10E3/uL (ref 3.4–10.8)

## 2024-07-24 LAB — LIPASE: Lipase: 45 U/L (ref 14–85)

## 2024-07-24 LAB — CMP14+EGFR
ALT: 12 IU/L (ref 0–32)
AST: 19 IU/L (ref 0–40)
Albumin: 4.2 g/dL (ref 3.7–4.7)
Alkaline Phosphatase: 67 IU/L (ref 44–121)
BUN/Creatinine Ratio: 20 (ref 12–28)
BUN: 30 mg/dL — ABNORMAL HIGH (ref 8–27)
Bilirubin Total: 0.3 mg/dL (ref 0.0–1.2)
CO2: 23 mmol/L (ref 20–29)
Calcium: 10 mg/dL (ref 8.7–10.3)
Chloride: 101 mmol/L (ref 96–106)
Creatinine, Ser: 1.53 mg/dL — ABNORMAL HIGH (ref 0.57–1.00)
Globulin, Total: 2.6 g/dL (ref 1.5–4.5)
Glucose: 99 mg/dL (ref 70–99)
Potassium: 4 mmol/L (ref 3.5–5.2)
Sodium: 141 mmol/L (ref 134–144)
Total Protein: 6.8 g/dL (ref 6.0–8.5)
eGFR: 34 mL/min/1.73 — ABNORMAL LOW (ref >59)

## 2024-07-25 LAB — URINE CULTURE

## 2024-07-26 ENCOUNTER — Ambulatory Visit: Payer: Self-pay | Admitting: Physician Assistant

## 2024-07-26 ENCOUNTER — Telehealth: Payer: Self-pay

## 2024-07-26 ENCOUNTER — Encounter: Payer: Self-pay | Admitting: Physician Assistant

## 2024-07-26 DIAGNOSIS — Z8719 Personal history of other diseases of the digestive system: Secondary | ICD-10-CM | POA: Insufficient documentation

## 2024-07-26 NOTE — Telephone Encounter (Signed)
 Copied from CRM [id number]. Topic: General - Other >> Jul 26, 2024 11:55 AM Kevelyn M wrote: Reason for CRM: Patient calling to let know Vermell that she is feeling better.

## 2024-07-26 NOTE — Progress Notes (Signed)
 Lindsey Fischer,   WBC is normal which is great news.  Lipase is normal.  No bacteria found in urine culture.  Kidney function stable but still chronic kidney disease stage 3b and looking DRY. Drink more water!!!   How are you feeling today?

## 2024-08-16 ENCOUNTER — Ambulatory Visit: Payer: Self-pay

## 2024-08-16 NOTE — Telephone Encounter (Signed)
 FYI Only or Action Required?: FYI only for provider.  Patient was last seen in primary care on 07/23/2024 by Antoniette Vermell CROME, PA-C.  Called Nurse Triage reporting Dysuria.  Symptoms began a week ago.  Interventions attempted: Rest, hydration, or home remedies.  Symptoms are: gradually improving.  Triage Disposition: See Physician Within 24 Hours  Patient/caregiver understands and will follow disposition?: Yes Reason for Disposition  Pain or burning with passing urine  Blood in urine  (Exception: Could be normal menstrual bleeding.)  Answer Assessment - Initial Assessment Questions Burning with urination, urine was pink 2 one week ago. Discoloration has improved, patient states she has increased fluid intake and urine is now clear. Also reports lower left abdominal pain when she needs to urinate, improves after urination.  Patient preferred to see PCP next day. Advised to call back if she develops flank pain, nausea, vomiting, fever or if hematuria returns. Pt verbalized understanding.  1. COLOR of URINE: Describe the color of the urine.  (e.g., tea-colored, pink, red, bloody) Do you have blood clots in your urine? (e.g., none, pea, grape, small coin)     Pink to clear  2. ONSET: When did the bleeding start?      One week ago  4. PAIN with URINATION: Is there any pain with passing your urine? If Yes, ask: How bad is the pain?  (Scale 1-10; or mild, moderate, severe)     Yes, burning  5. FEVER: Do you have a fever? If Yes, ask: What is your temperature, how was it measured, and when did it start?     Denies  6. ASSOCIATED SYMPTOMS: Are you passing urine more frequently than usual?     Yes  7. OTHER SYMPTOMS: Do you have any other symptoms? (e.g., back/flank pain, abdomen pain, vomiting)     Abdominal pain prior to urination.  Protocols used: Urine - Blood In-A-AH Copied from CRM #[id number]. Topic: Clinical - Red Word Triage >> Aug 16, 2024  8:22 AM Delon HERO  wrote: Red Word that prompted transfer to Nurse Triage: Patient is calling to report vaginal bleeding. Drank cranberry juice, and yogurt. A week ago started medication diverticulitis. Afterwards bladder infection start. Vaginal pain for one week.

## 2024-08-17 ENCOUNTER — Encounter: Payer: Self-pay | Admitting: Physician Assistant

## 2024-08-17 ENCOUNTER — Ambulatory Visit: Admitting: Physician Assistant

## 2024-08-17 VITALS — BP 144/64 | HR 75 | Temp 98.1°F | Ht 63.0 in | Wt 158.0 lb

## 2024-08-17 DIAGNOSIS — R300 Dysuria: Secondary | ICD-10-CM

## 2024-08-17 DIAGNOSIS — R311 Benign essential microscopic hematuria: Secondary | ICD-10-CM | POA: Diagnosis not present

## 2024-08-17 DIAGNOSIS — Z23 Encounter for immunization: Secondary | ICD-10-CM

## 2024-08-17 DIAGNOSIS — R809 Proteinuria, unspecified: Secondary | ICD-10-CM | POA: Insufficient documentation

## 2024-08-17 LAB — POCT URINALYSIS DIP (CLINITEK)
Bilirubin, UA: NEGATIVE
Glucose, UA: 500 mg/dL — AB
Ketones, POC UA: NEGATIVE mg/dL
Nitrite, UA: NEGATIVE
Spec Grav, UA: 1.025 (ref 1.010–1.025)
Urobilinogen, UA: 0.2 U/dL
pH, UA: 5.5 (ref 5.0–8.0)

## 2024-08-17 NOTE — Patient Instructions (Signed)
 Will wait for culture to treat if needed.  Drink plenty of water.  If no bacteria found repeat UA in 1-2 weeks to confirm trace blood cleared.

## 2024-08-17 NOTE — Progress Notes (Signed)
 Acute Office Visit  Subjective:     Patient ID: Lindsey Fischer, female    DOB: 29-Nov-1940, 83 y.o.   MRN: [id number]  Patient is a 83 yo female with T2DM and recent diverticulitis flare who presents to the clinic with urinary concerns. She is accompanied by daughter. She finished her cipro  and flagyl  combination and a week later had urinary symptoms. She has  had burning with urination and lower abdominal pressure. She denies any fever, chills, nausea, flank pain. She was at the beach and took OTC sulfur. Her symptoms improved but she is concerned and wants to make sure there is no infection.     Review of Systems  Constitutional:  Negative for fever.  Gastrointestinal:  Positive for abdominal pain.  Genitourinary:  Positive for dysuria.  All other systems reviewed and are negative.       Objective:    BP (!) 144/64   Pulse 75   Temp 98.1 F (36.7 C) (Oral)   Ht 5' 3 (1.6 m)   Wt 158 lb (71.7 kg)   SpO2 97%   BMI 27.99 kg/m  BP Readings from Last 3 Encounters:  08/17/24 (!) 144/64  07/23/24 (!) 123/57  06/29/24 131/66   Wt Readings from Last 3 Encounters:  08/17/24 158 lb (71.7 kg)  07/23/24 157 lb (71.2 kg)  06/29/24 157 lb (71.2 kg)      Physical Exam Vitals reviewed.  Constitutional:      Appearance: Normal appearance. She is normal weight.  HENT:     Head: Normocephalic.  Cardiovascular:     Rate and Rhythm: Normal rate.  Pulmonary:     Effort: Pulmonary effort is normal.  Abdominal:     General: Abdomen is flat. Bowel sounds are normal. There is no distension.     Palpations: Abdomen is soft.     Tenderness: There is no abdominal tenderness. There is no right CVA tenderness, left CVA tenderness or guarding.  Neurological:     Mental Status: She is alert and oriented to person, place, and time.  Psychiatric:        Mood and Affect: Mood normal.        Behavior: Behavior normal.     Results for orders placed or performed in visit on 08/17/24   POCT URINALYSIS DIP (CLINITEK)  Result Value Ref Range   Color, UA other (A) yellow   Clarity, UA cloudy (A) clear   Glucose, UA =500 (A) negative mg/dL   Bilirubin, UA negative negative   Ketones, POC UA negative negative mg/dL   Spec Grav, UA 8.974 8.989 - 1.025   Blood, UA trace-intact (A) negative   pH, UA 5.5 5.0 - 8.0   POC PROTEIN,UA trace negative, trace   Urobilinogen, UA 0.2 0.2 or 1.0 E.U./dL   Nitrite, UA Negative Negative   Leukocytes, UA Small (1+) (A) Negative        Assessment & Plan:  SABRASABRAValyncia was seen today for dysuria.  Diagnoses and all orders for this visit:  Dysuria -     POCT URINALYSIS DIP (CLINITEK) -     Urine Culture  Need for influenza vaccination -     Flu vaccine HIGH DOSE PF(Fluzone Trivalent)  Benign essential microscopic hematuria  Proteinuria, unspecified type   UA today showed trace blood, protein and leukocytes Will culture Pt treated herself with OTC sulfur and now asymptomatic No treatment today If culture negative then will repeat UA in 1 week to confirm protein and  hematuria cleared If culture positive will treat accordingly Follow up as needed if symptoms persist or worsen   Vermell Bologna, PA-C

## 2024-08-23 ENCOUNTER — Ambulatory Visit: Payer: Self-pay | Admitting: Physician Assistant

## 2024-08-23 LAB — URINE CULTURE

## 2024-08-23 MED ORDER — AMOXICILLIN-POT CLAVULANATE 500-125 MG PO TABS: 1.0000 | ORAL_TABLET | Freq: Two times a day (BID) | ORAL | 0 refills | Status: DC

## 2024-08-23 NOTE — Progress Notes (Signed)
 Urine culture showed proteus. Will treat with augmentin . Sent to pharmacy.

## 2024-09-03 DIAGNOSIS — I129 Hypertensive chronic kidney disease with stage 1 through stage 4 chronic kidney disease, or unspecified chronic kidney disease: Secondary | ICD-10-CM | POA: Diagnosis not present

## 2024-09-03 DIAGNOSIS — Z79899 Other long term (current) drug therapy: Secondary | ICD-10-CM | POA: Diagnosis not present

## 2024-09-03 DIAGNOSIS — Z833 Family history of diabetes mellitus: Secondary | ICD-10-CM | POA: Diagnosis not present

## 2024-09-03 DIAGNOSIS — N1832 Chronic kidney disease, stage 3b: Secondary | ICD-10-CM | POA: Diagnosis not present

## 2024-09-03 DIAGNOSIS — Z91018 Allergy to other foods: Secondary | ICD-10-CM | POA: Diagnosis not present

## 2024-09-03 DIAGNOSIS — Z9989 Dependence on other enabling machines and devices: Secondary | ICD-10-CM | POA: Diagnosis not present

## 2024-09-03 DIAGNOSIS — Z6827 Body mass index (BMI) 27.0-27.9, adult: Secondary | ICD-10-CM | POA: Diagnosis not present

## 2024-09-03 DIAGNOSIS — J45909 Unspecified asthma, uncomplicated: Secondary | ICD-10-CM | POA: Diagnosis not present

## 2024-09-03 DIAGNOSIS — Z59869 Financial insecurity, unspecified: Secondary | ICD-10-CM | POA: Diagnosis not present

## 2024-09-03 DIAGNOSIS — E1122 Type 2 diabetes mellitus with diabetic chronic kidney disease: Secondary | ICD-10-CM | POA: Diagnosis not present

## 2024-09-03 DIAGNOSIS — E663 Overweight: Secondary | ICD-10-CM | POA: Diagnosis not present

## 2024-09-03 DIAGNOSIS — F325 Major depressive disorder, single episode, in full remission: Secondary | ICD-10-CM | POA: Diagnosis not present

## 2024-09-03 DIAGNOSIS — Z91010 Allergy to peanuts: Secondary | ICD-10-CM | POA: Diagnosis not present

## 2024-09-03 DIAGNOSIS — Z7951 Long term (current) use of inhaled steroids: Secondary | ICD-10-CM | POA: Diagnosis not present

## 2024-09-03 DIAGNOSIS — E785 Hyperlipidemia, unspecified: Secondary | ICD-10-CM | POA: Diagnosis not present

## 2024-09-03 DIAGNOSIS — Z8249 Family history of ischemic heart disease and other diseases of the circulatory system: Secondary | ICD-10-CM | POA: Diagnosis not present

## 2024-09-03 DIAGNOSIS — R32 Unspecified urinary incontinence: Secondary | ICD-10-CM | POA: Diagnosis not present

## 2024-09-03 DIAGNOSIS — I700 Atherosclerosis of aorta: Secondary | ICD-10-CM | POA: Diagnosis not present

## 2024-09-03 LAB — LAB REPORT - SCANNED: Microalb Creat Ratio: 36

## 2024-09-23 ENCOUNTER — Ambulatory Visit: Admitting: Family Medicine

## 2024-09-24 ENCOUNTER — Encounter: Payer: Self-pay | Admitting: Physician Assistant

## 2024-09-24 ENCOUNTER — Ambulatory Visit (INDEPENDENT_AMBULATORY_CARE_PROVIDER_SITE_OTHER): Admitting: Physician Assistant

## 2024-09-24 VITALS — BP 130/70 | HR 73 | Ht 63.0 in | Wt 156.0 lb

## 2024-09-24 DIAGNOSIS — E1122 Type 2 diabetes mellitus with diabetic chronic kidney disease: Secondary | ICD-10-CM

## 2024-09-24 DIAGNOSIS — E785 Hyperlipidemia, unspecified: Secondary | ICD-10-CM | POA: Diagnosis not present

## 2024-09-24 DIAGNOSIS — Z7984 Long term (current) use of oral hypoglycemic drugs: Secondary | ICD-10-CM

## 2024-09-24 DIAGNOSIS — N1832 Chronic kidney disease, stage 3b: Secondary | ICD-10-CM

## 2024-09-24 DIAGNOSIS — I10 Essential (primary) hypertension: Secondary | ICD-10-CM

## 2024-09-24 LAB — POCT GLYCOSYLATED HEMOGLOBIN (HGB A1C): Hemoglobin A1C: 6.2 % — AB (ref 4.0–5.6)

## 2024-09-24 NOTE — Progress Notes (Signed)
 Established Patient Office Visit  Subjective   Patient ID: Lindsey Fischer, female    DOB: 16-Mar-1941  Age: 83 y.o. MRN: [id number]  Chief Complaint  Patient presents with   Medical Management of Chronic Issues    Last A1c 6.4    HPI Discussed the use of AI scribe software for clinical note transcription with the patient, who gave verbal consent to proceed.  History of Present Illness Lindsey Fischer is an 83 year old female with diabetes who presents for routine follow-up.  Glycemic control - Slightly elevated blood glucose levels with readings of 134 mg/dL and 871 mg/dL from different fingers this morning - Blood glucose of 104 mg/dL at 88:69 PM last night - Currently taking metformin  and Jardiance  for diabetes management - Maintains a healthy diet, with supper around 6:00 or 6:30 PM the previous night  Blood pressure monitoring - Home blood pressure readings around 130/85 mmHg  Cardiopulmonary symptoms - No chest pain, shortness of breath, or swelling - Breathing is generally good, with occasional mild congestion described as 'stocked up a little bit'  Gastrointestinal status - Gastrointestinal health is stable - No reported gastrointestinal issues  Functional status - Remains active, 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    Objective:     BP 130/70   Pulse 73   Ht 5' 3 (1.6 m)   Wt 156 lb (70.8 kg)   SpO2 99%   BMI 27.63 kg/m  BP Readings from Last 3 Encounters:  09/24/24 130/70  08/17/24 (!) 144/64  07/23/24 (!) 123/57   Wt Readings from Last 3 Encounters:  09/24/24 156 lb (70.8 kg)  08/17/24 158 lb (71.7 kg)  07/23/24 157 lb (71.2 kg)      Physical Exam Constitutional:      Appearance: Normal appearance.  HENT:     Head: Normocephalic.  Neck:     Vascular: No carotid bruit.  Cardiovascular:     Rate and Rhythm: Normal rate and regular rhythm.     Heart sounds: Normal heart sounds.  Pulmonary:     Effort:  Pulmonary effort is normal.     Breath sounds: Normal breath sounds.  Musculoskeletal:     Cervical back: Normal range of motion and neck supple. No tenderness.     Right lower leg: No edema.     Left lower leg: No edema.  Lymphadenopathy:     Cervical: No cervical adenopathy.  Neurological:     General: No focal deficit present.     Mental Status: She is alert and oriented to person, place, and time.  Psychiatric:        Mood and Affect: Mood normal.      Results for orders placed or performed in visit on 09/24/24  POCT HgB A1C  Result Value Ref Range   Hemoglobin A1C 6.2 (A) 4.0 - 5.6 %   HbA1c POC (<> result, manual entry)     HbA1c, POC (prediabetic range)     HbA1c, POC (controlled diabetic range)         Assessment & Plan:  Lindsey Fischer was seen today for medical management of chronic issues.  Diagnoses and all orders for this visit:  Type 2 diabetes mellitus with stage 3b chronic kidney disease, without long-term current use of insulin (HCC) -     POCT HgB A1C  ESSENTIAL HYPERTENSION, BENIGN  Dyslipidemia, goal LDL below 70   Assessment & Plan Type 2 diabetes mellitus  associated with CKD 3b A1C to goal - Continue metformin  and Jardiance  as prescribed. - GFR stable at 34, continue ARB and SGLT2, labs UTD - ON statin - BP to goal, on ARB/hydrochlorothiazide  combination - Flu and pneumonia vaccine UTD - Encouraged to get shingles and Tdap at the pharmacy.   General Health Maintenance Blood pressure well-controlled at 130/70 mmHg. No chest pain or shortness of breath. Active lifestyle maintained. - Continue current lifestyle and activity level. - Monitor blood pressure regularly. - Avoid exposure to sick individuals and practice good hand hygiene.    Return in about 3 months (around 12/25/2024).    Lavanya Roa, PA-C

## 2024-12-24 ENCOUNTER — Ambulatory Visit: Admitting: Physician Assistant

## 2024-12-27 ENCOUNTER — Ambulatory Visit: Admitting: Physician Assistant

## 2025-02-08 ENCOUNTER — Ambulatory Visit
